# Patient Record
Sex: Female | Born: 1940
Health system: Southern US, Community
[De-identification: ages and names within clinical notes are randomized; demographics above are authoritative.]

## PROBLEM LIST (undated history)

## (undated) DIAGNOSIS — Z923 Personal history of irradiation: Secondary | ICD-10-CM

## (undated) DIAGNOSIS — I10 Essential (primary) hypertension: Secondary | ICD-10-CM

## (undated) HISTORY — PX: NO PAST SURGERIES: SHX2092

---

## 2019-01-07 ENCOUNTER — Ambulatory Visit
Admission: EM | Admit: 2019-01-07 | Discharge: 2019-01-07 | Disposition: A | Discharge status: Home / Self Care | Payer: Medicare Other

## 2019-01-07 ENCOUNTER — Encounter: Payer: Self-pay | Admitting: Emergency Medicine

## 2019-01-07 ENCOUNTER — Other Ambulatory Visit: Payer: Self-pay

## 2019-01-07 DIAGNOSIS — R222 Localized swelling, mass and lump, trunk: Secondary | ICD-10-CM | POA: Diagnosis not present

## 2019-01-07 NOTE — ED Notes (Signed)
Patient able to ambulate independently  

## 2019-01-07 NOTE — ED Triage Notes (Signed)
Pt presents to [facility] for assessment to lump on right breast bone which she noted a few weeks ago.  States it is not painful to touch.

## 2019-01-07 NOTE — ED Provider Notes (Signed)
[name]    CSN: [id number] Arrival date & time: 01/07/19  1406      History   Chief Complaint Chief Complaint  Patient presents with  . Lump on Chest    HPI Barbara Green is a 78 y.o. female.   78 year old female with no documented medical history, without PCP care/follow up for 10+ years comes in for right breast/chest mass. She is unsure how long mass has been present, noticed few weeks ago after showering. Denies pain, itching. Denies changes in size. Denies chest pain, shortness of breath, palpitations. Denies nipple discharge, skin changes. Denies personal or family history of breast cancer. Denies ever getting mammograms.      History reviewed. No pertinent past medical history.  There are no problems to display for this patient.   History reviewed. No pertinent surgical history.  OB History   No obstetric history on file.      Home Medications    Prior to Admission medications   Not on File    Family History Family History  Problem Relation Age of Onset  . Hypertension Mother     Social History Social History   Tobacco Use  . Smoking status: Current Every Day Smoker    Packs/day: 0.50  . Smokeless tobacco: Never Used  Substance Use Topics  . Alcohol use: Yes    Comment: occasionally  . Drug use: Never     Allergies   Patient has no known allergies.   Review of Systems Review of Systems  Reason unable to perform ROS: See HPI as above.     Physical Exam Triage Vital Signs ED Triage Vitals  Enc Vitals Group     BP 01/07/19 1414 (!) 167/87     Pulse Rate 01/07/19 1414 (!) 126     Resp 01/07/19 1414 20     Temp 01/07/19 1414 98.4 F (36.9 C)     Temp Source 01/07/19 1414 Oral     SpO2 01/07/19 1414 94 %     Weight --      Height --      Head Circumference --      Peak Flow --      Pain Score 01/07/19 1421 0     Pain Loc --      Pain Edu? --      Excl. in [place]? --    No data found.  Updated Vital  Signs BP (!) 167/87 (BP Location: Left Arm)   Pulse (!) 110   Temp 98.4 F (36.9 C) (Oral)   Resp 20   SpO2 94%   Physical Exam Constitutional:      General: She is not in acute distress.    Appearance: Normal appearance. She is not ill-appearing, toxic-appearing or diaphoretic.  HENT:     Head: Normocephalic and atraumatic.     Mouth/Throat:     Mouth: Mucous membranes are moist.     Pharynx: Oropharynx is clear. Uvula midline.  Cardiovascular:     Rate and Rhythm: Regular rhythm. Tachycardia present.     Heart sounds: Normal heart sounds. No murmur. No friction rub. No gallop.   Pulmonary:     Effort: Pulmonary effort is normal. No accessory muscle usage, prolonged expiration, respiratory distress or retractions.     Comments: Lungs clear to auscultation without adventitious lung sounds. Chest:       Comments: No obvious tenderness or mass palpated to the rest of the breast. No nipple changes. No  obvious lymphadenopathy felt.  Musculoskeletal:     Cervical back: Normal range of motion and neck supple.  Neurological:     General: No focal deficit present.     Mental Status: She is alert and oriented to person, place, and time.      UC Treatments / Results  Labs (all labs ordered are listed, but only abnormal results are displayed) Labs Reviewed - No data to display  EKG   Radiology No results found.  Procedures Procedures (including critical care time)  Medications Ordered in UC Medications - No data to display  Initial Impression / Assessment and Plan / UC Course  I have reviewed the triage vital signs and the nursing notes.  Pertinent labs & imaging results that were available during my care of the patient were reviewed by me and considered in my medical decision making (see chart for details).    Patient with tachycardia during triage. Denies chest pain, shortness of breath, palpitations, weakness, dizziness, syncope. Tachycardia improving during  recheck, patient states feels nervous, will have her continue to monitoring.  Mass is protruding and visible. Appears to be more on the chest wall vs right breast. Discussed ordering mammogram and possible ultrasound to the breast, and if unable to view mass, to follow up with PCP. Patient would like to first follow up with PCP for evaluation. PCP resources provided. Return precautions given. Patient expresses understanding and agrees to plan.  Final Clinical Impressions(s) / UC Diagnoses   Final diagnoses:  Mass of right chest wall   ED Prescriptions    None     PDMP not reviewed this encounter.   Ok Edwards, PA-C 01/07/19 1523

## 2019-01-07 NOTE — Discharge Instructions (Signed)
As discussed, I would like this to be further evaluated. Please follow up with PCP. At this time, continue to monitor size.

## 2019-02-07 ENCOUNTER — Telehealth: Payer: Self-pay

## 2019-02-07 NOTE — Telephone Encounter (Signed)
Called patient to do their pre-visit COVID screening.  Call went to voicemail. Unable to do prescreening.  

## 2019-02-08 ENCOUNTER — Other Ambulatory Visit: Payer: Self-pay

## 2019-02-08 ENCOUNTER — Ambulatory Visit (INDEPENDENT_AMBULATORY_CARE_PROVIDER_SITE_OTHER): Payer: Medicare Other | Admitting: Internal Medicine

## 2019-02-08 VITALS — BP 207/130 | HR 123 | Temp 97.2°F | Resp 18 | Ht 59.0 in | Wt 116.8 lb

## 2019-02-08 DIAGNOSIS — Z2821 Immunization not carried out because of patient refusal: Secondary | ICD-10-CM

## 2019-02-08 DIAGNOSIS — F17210 Nicotine dependence, cigarettes, uncomplicated: Secondary | ICD-10-CM

## 2019-02-08 DIAGNOSIS — I10 Essential (primary) hypertension: Secondary | ICD-10-CM | POA: Diagnosis not present

## 2019-02-08 DIAGNOSIS — N6312 Unspecified lump in the right breast, upper inner quadrant: Secondary | ICD-10-CM | POA: Insufficient documentation

## 2019-02-08 DIAGNOSIS — R000 Tachycardia, unspecified: Secondary | ICD-10-CM | POA: Diagnosis not present

## 2019-02-08 DIAGNOSIS — F17200 Nicotine dependence, unspecified, uncomplicated: Secondary | ICD-10-CM | POA: Insufficient documentation

## 2019-02-08 MED ORDER — AMLODIPINE BESYLATE 5 MG PO TABS: 7.5000 mg | ORAL_TABLET | Freq: Every day | ORAL | 5 refills | Status: DC

## 2019-02-08 NOTE — Patient Instructions (Addendum)
You have been referred for a mammogram and to see a general surgeon.  You will be called with these appointments.    Try to limit salt in the foods.    We have started you on a blood pressure medication called Norvasc (Amlodipine) to help lower your blood pressure.

## 2019-02-08 NOTE — Progress Notes (Signed)
Patient ID: Barbara Green, female    DOB: [birth year]  MRN: [id number]  CC: Establish Care and Follow-up (urgent care f/up for lump on chest. states that it isn't painful. hasn't increased in size.)   Subjective: Barbara Green is a 79 y.o. female who presents for new pt visit at [facility] Her concerns today include:   pt with hx of tob dep No previous PCP.  Pt states it has been a long time since she saw a primary doctor.    Seen at UC 1 mth ago with concern about a knot located in center of chest close to the RT breast that she just notice. Not painful, no redness.  No increase in size since 1 mth ago. Never kept up to date with MMG.  In fact she never had a MMG BP noted to be elevated.  No BP issues in past Endorses some lightheadedness in mornings with sudden position changes.  Last several seconds. Dizziness when in bed.   No headaches, CP, SOB, LE edema.  Use to be active prior to COVID.  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   She does not use a lot of salt in foods.  She cooks with fresh ingredients rather than a lot of can foods.   Pulse also noted to be elev in UC and today.  Denies palpitations.   No unexplained wgh changes.  No chronic diarrhea.  No hot or cold intolerance.   Tob dep:  Smoked for 30+ yrs.  At most she was 1 pk some days.  Currently 1/3-1/2 pk a day.  Quit in the past for 1 mth.  Thinks quitting would be nice but feels too stress right now in caring for spouse and COVID pandemic to quit  Past medical, surgical, family history and social history reviewed and updated.  Patient Active Problem List   Diagnosis Date Noted  . Tobacco dependence 02/08/2019  . Unspecified lump in the right breast, upper inner quadrant  02/08/2019     No current outpatient medications on file prior to visit.   No current facility-administered medications on file prior to visit.    No Known Allergies  Social History    Socioeconomic History  . Marital status: Married    Spouse name: Not on file  . Number of children: 3  . Years of education: Not on file  . Highest education level: 12th grade  Occupational History  . Not on file  Tobacco Use  . Smoking status: Current Every Day Smoker    Packs/day: 0.33    Years: 30.00    Pack years: 9.90  . Smokeless tobacco: Never Used  Substance and Sexual Activity  . Alcohol use: Yes    Comment: occasionally a glass wine  . Drug use: Never  . Sexual activity: Not on file  Other Topics Concern  . Not on file  Social History Narrative  . Not on file   Social Determinants of Health   Financial Resource Strain:   . Difficulty of Paying Living Expenses: Not on file  Food Insecurity:   . Worried About Charity fundraiser in the Last Year: Not on file  . Ran Out of Food in the Last Year: Not on file  Transportation Needs:   . Lack of Transportation (Medical): Not on file  . Lack of Transportation (Non-Medical): Not on file  Physical Activity:   . Days of Exercise per Week:  Not on file  . Minutes of Exercise per Session: Not on file  Stress:   . Feeling of Stress : Not on file  Social Connections:   . Frequency of Communication with Friends and Family: Not on file  . Frequency of Social Gatherings with Friends and Family: Not on file  . Attends Religious Services: Not on file  . Active Member of Clubs or Organizations: Not on file  . Attends Archivist Meetings: Not on file  . Marital Status: Not on file  Intimate Partner Violence:   . Fear of Current or Ex-Partner: Not on file  . Emotionally Abused: Not on file  . Physically Abused: Not on file  . Sexually Abused: Not on file    Family History  Problem Relation Age of Onset  . Alzheimer's disease Mother   . Heart disease Father   . Alcohol abuse Father     Past Surgical History:  Procedure Laterality Date  . NO PAST SURGERIES      ROS: Review of Systems  Constitutional:  Negative for appetite change, fatigue and fever.  HENT: Negative for sore throat.   Eyes: Negative for visual disturbance.  Respiratory: Negative for cough and shortness of breath.   Cardiovascular: Negative for chest pain and palpitations.  Gastrointestinal: Negative for abdominal pain and blood in stool.  Genitourinary: Negative for difficulty urinating, dysuria and hematuria.  Skin: Negative for rash.  Psychiatric/Behavioral: Negative for dysphoric mood. The patient is not nervous/anxious.    Negative except as stated above  PHYSICAL EXAM: BP (!) 207/130   Pulse (!) 123   Temp (!) 97.2 F (36.2 C) (Temporal)   Resp 18   Ht 4\' 11"  (1.499 m)   Wt 116 lb 12.8 oz (53 kg)   SpO2 96%   BMI 23.59 kg/m   Physical Exam Blood pressure left arm 173/106; right arm 175/105 General appearance - alert, well appearing, elderly caucasian female and in no distress Mental status - normal mood, behavior, speech, dress, motor activity, and thought processes Eyes - pink conjunctiva, EOMI Nose - normal and patent, no erythema, discharge or polyps Mouth - mucous membranes moist, pharynx normal without lesions.  She has dentures above and below. Neck -no palpable masses.  Neck is supple.  No thyroid enlargement or thyroid nodules Lymphatics -no cervical or axillary lymphadenopathy no hepatosplenomegaly Chest - clear to auscultation, no wheezes, rales or rhonchi, symmetric air entry Heart -tachycardic with regular and rhythm.  No gallops or murmurs appreciated  abdomen - soft, nontender, nondistended, no masses or organomegaly Breasts -CMA Cara Walker present: No significant asymmetry noted of the breasts.  There is no 1 cm superficial, firm mass in the upper inner quadrant of the right breast.  It is in the forearm border of the breast tissue the sternum.  No other masses palpated.  No discharge expressed from the nipples Extremities -no lower extremity edema.  EKG sinus tachycardia at a rate of 111.   Poor R wave progression.  Low voltage ASSESSMENT AND PLAN: 1. Unspecified lump in the right breast, upper inner quadrant  This may be a cyst but in any event it needs to be evaluated and diagnosed given that it is located at the edge of the breast tissue. - MM Digital Diagnostic Unilat R; Future - Ambulatory referral to General Surgery  2. Essential hypertension Patient reports no known previous history.  She has significant elevation of both systolic and diastolic blood pressure noted 1 month ago on visit  to urgent care and also today.  I recommend starting blood pressure medication.  Ideally I would have wanted to start her on lisinopril/HCTZ combination but I do not know what her kidney function is like.  So for now I will put her on amlodipine 7.5 mg daily and check baseline blood tests.  Discussed DASH diet.  She does have access to a blood pressure monitoring device at home.  Have asked her to check her blood pressure at least twice a week and bring in readings on next visit.  Went over goal with her above 130/80 or lower - CBC - Comprehensive metabolic panel - Lipid panel - amLODipine (NORVASC) 5 MG tablet; Take 1.5 tablets (7.5 mg total) by mouth daily.  Dispense: 45 tablet; Refill: 5  3. Tachycardia If we need to add on BP med on future visit, consider Coreg - EKG 12-Lead - TSH  4. Tobacco dependence Patient advised to quit smoking. Discussed health risks associated with smoking including lung and other types of cancers, chronic lung diseases and CV risks.. Pt not ready to give trail of quitting.  Less than 5 minutes spent on counseling.  5. Influenza vaccination declined This was offered.  Pt declined  6. Pneumococcal vaccination declined This was offered  Pt declined    Patient was given the opportunity to ask questions.  Patient verbalized understanding of the plan and was able to repeat key elements of the plan.   Orders Placed This Encounter  Procedures  . MM  Digital Diagnostic Unilat R  . CBC  . Comprehensive metabolic panel  . Lipid panel  . TSH  . Ambulatory referral to General Surgery  . EKG 12-Lead     Requested Prescriptions   Signed Prescriptions Disp Refills  . amLODipine (NORVASC) 5 MG tablet 45 tablet 5    Sig: Take 1.5 tablets (7.5 mg total) by mouth daily.    Return in about 1 week (around 02/15/2019) for BP recheck.  Karle Plumber, MD, FACP

## 2019-02-09 ENCOUNTER — Telehealth: Payer: Self-pay | Admitting: Internal Medicine

## 2019-02-09 DIAGNOSIS — E875 Hyperkalemia: Secondary | ICD-10-CM

## 2019-02-09 LAB — COMPREHENSIVE METABOLIC PANEL
ALT: 23 IU/L (ref 0–32)
AST: 23 IU/L (ref 0–40)
Albumin/Globulin Ratio: 2.1 (ref 1.2–2.2)
Albumin: 4.7 g/dL (ref 3.7–4.7)
Alkaline Phosphatase: 86 IU/L (ref 39–117)
BUN/Creatinine Ratio: 12 (ref 12–28)
BUN: 6 mg/dL — ABNORMAL LOW (ref 8–27)
Bilirubin Total: 0.5 mg/dL (ref 0.0–1.2)
CO2: 25 mmol/L (ref 20–29)
Calcium: 9.6 mg/dL (ref 8.7–10.3)
Chloride: 95 mmol/L — ABNORMAL LOW (ref 96–106)
Creatinine, Ser: 0.51 mg/dL — ABNORMAL LOW (ref 0.57–1.00)
GFR calc Af Amer: 107 mL/min/{1.73_m2} (ref >59)
GFR calc non Af Amer: 93 mL/min/{1.73_m2} (ref >59)
Globulin, Total: 2.2 g/dL (ref 1.5–4.5)
Glucose: 105 mg/dL — ABNORMAL HIGH (ref 65–99)
Potassium: 5.5 mmol/L — ABNORMAL HIGH (ref 3.5–5.2)
Sodium: 133 mmol/L — ABNORMAL LOW (ref 134–144)
Total Protein: 6.9 g/dL (ref 6.0–8.5)

## 2019-02-09 LAB — CBC
Hematocrit: 43.8 % (ref 34.0–46.6)
Hemoglobin: 14.8 g/dL (ref 11.1–15.9)
MCH: 30.9 pg (ref 26.6–33.0)
MCHC: 33.8 g/dL (ref 31.5–35.7)
MCV: 91 fL (ref 79–97)
Platelets: 335 10*3/uL (ref 150–450)
RBC: 4.79 x10E6/uL (ref 3.77–5.28)
RDW: 12.9 % (ref 11.7–15.4)
WBC: 10.4 10*3/uL (ref 3.4–10.8)

## 2019-02-09 LAB — LIPID PANEL
Chol/HDL Ratio: 2.3 ratio (ref 0.0–4.4)
Cholesterol, Total: 274 mg/dL — ABNORMAL HIGH (ref 100–199)
HDL: 117 mg/dL (ref >39)
LDL Chol Calc (NIH): 148 mg/dL — ABNORMAL HIGH (ref 0–99)
Triglycerides: 58 mg/dL (ref 0–149)
VLDL Cholesterol Cal: 9 mg/dL (ref 5–40)

## 2019-02-09 LAB — TSH: TSH: 1.74 u[IU]/mL (ref 0.450–4.500)

## 2019-02-09 NOTE — Telephone Encounter (Signed)
PC placed to pt today to go over lab results.  Pt informed that potassium level is elevated. This may be a true elevation or due to hemolysis.  I recommend that she return to lab for repeat.  CMA will call her with appt for lab for this week. Recommend decreasing consumption of potassium rich foods like bananas, OJ, oranges etc.   Thyroid level nl. Total and LDL cholesterol elevated.  Went over goals.  Dietary counseling given including basics on how to read food labels when it comes to fat content.  Blood count nl.  Kidney and LFTs nl. Pt expressed understanding and thanked me for calling her.

## 2019-02-09 NOTE — Telephone Encounter (Signed)
Please schedule lab appointment. Does not need to be fasting.

## 2019-02-23 ENCOUNTER — Other Ambulatory Visit: Payer: Self-pay

## 2019-02-23 ENCOUNTER — Other Ambulatory Visit (INDEPENDENT_AMBULATORY_CARE_PROVIDER_SITE_OTHER): Payer: Medicare Other

## 2019-02-23 DIAGNOSIS — I10 Essential (primary) hypertension: Secondary | ICD-10-CM | POA: Diagnosis not present

## 2019-02-23 DIAGNOSIS — E875 Hyperkalemia: Secondary | ICD-10-CM

## 2019-02-23 NOTE — Progress Notes (Signed)
Patient here for BP check & fasting labs. Has been taking medication regularly. After sitting BP was 142/90, pulse was 113. Stopped patient to the desk to make a BP follow up with the provider. KWalker, CMA.

## 2019-02-24 LAB — POTASSIUM: Potassium: 4.8 mmol/L (ref 3.5–5.2)

## 2019-02-24 NOTE — Progress Notes (Signed)
Patient notified of results & recommendations. Expressed understanding.

## 2019-02-28 ENCOUNTER — Other Ambulatory Visit: Payer: Self-pay | Admitting: Internal Medicine

## 2019-02-28 DIAGNOSIS — N6312 Unspecified lump in the right breast, upper inner quadrant: Secondary | ICD-10-CM

## 2019-03-15 ENCOUNTER — Telehealth: Payer: Self-pay

## 2019-03-15 ENCOUNTER — Ambulatory Visit: Payer: Medicare Other | Admitting: Internal Medicine

## 2019-03-15 NOTE — Telephone Encounter (Signed)

## 2019-03-16 ENCOUNTER — Ambulatory Visit (INDEPENDENT_AMBULATORY_CARE_PROVIDER_SITE_OTHER): Payer: Medicare Other | Admitting: Internal Medicine

## 2019-03-16 ENCOUNTER — Other Ambulatory Visit: Payer: Self-pay

## 2019-03-16 ENCOUNTER — Encounter: Payer: Self-pay | Admitting: Internal Medicine

## 2019-03-16 VITALS — BP 159/85 | HR 108 | Temp 98.4°F | Resp 17 | Wt 114.0 lb

## 2019-03-16 DIAGNOSIS — I10 Essential (primary) hypertension: Secondary | ICD-10-CM

## 2019-03-16 MED ORDER — LOSARTAN POTASSIUM 25 MG PO TABS: 25.0000 mg | ORAL_TABLET | Freq: Every day | ORAL | 0 refills | Status: DC

## 2019-03-16 MED ORDER — AMLODIPINE BESYLATE 10 MG PO TABS: 10.0000 mg | ORAL_TABLET | Freq: Every day | ORAL | 3 refills | Status: DC

## 2019-03-16 NOTE — Progress Notes (Signed)
  Subjective:    Barbara Green - 79 y.o. female MRN [id number]  Date of birth: 06-25-1940  HPI  Barbara Green is here for HTN follow up. Newly diagnosed at last office visit on 1/12 and started on Amlodipine 7.5 mg daily.   Chronic HTN Disease Monitoring:  Home BP Monitoring - Husband has a home health aid and she has been checking it about 3x a week for her. Last check was in 140s/80s. But was fluctuating a lot.  Chest pain- no  Dyspnea- no Headache - no  Medications: Amlodipine 7.5 mg daily  Compliance- yes Lightheadedness- no  Edema- no   Health Maintenance Due  Topic Date Due  . DEXA SCAN  09/05/2006    -  reports that she has been smoking. She has a 9.90 pack-year smoking history. She has never used smokeless tobacco. - Review of Systems: Per HPI. - Past Medical History: Patient Active Problem List   Diagnosis Date Noted  . Tobacco dependence 02/08/2019  . Unspecified lump in the right breast, upper inner quadrant  02/08/2019  . Essential hypertension 02/08/2019  . Tachycardia 02/08/2019   - Medications: reviewed and updated   Objective:   Physical Exam BP (!) 159/85   Pulse (!) 108   Temp 98.4 F (36.9 C) (Temporal)   Resp 17   Wt 114 lb (51.7 kg)   SpO2 96%   BMI 23.03 kg/m  Physical Exam  Constitutional: She is oriented to person, place, and time and well-developed, well-nourished, and in no distress. No distress.  HENT:  Head: Normocephalic and atraumatic.  Eyes: Conjunctivae and EOM are normal.  Cardiovascular: Regular rhythm and normal heart sounds.  No murmur heard. Mildly tachycardic.   Pulmonary/Chest: Effort normal and breath sounds normal. No respiratory distress.  Musculoskeletal:        General: Normal range of motion.  Neurological: She is alert and oriented to person, place, and time.  Skin: Skin is warm and dry. She is not diaphoretic.  Psychiatric: Affect and judgment normal.           Assessment & Plan:    1. Essential hypertension BP not at goal today. Increase Amlodipine. Start Losartan. Hyperkalemia present has resolved on repeat blood work. Will repeat BMET in one week with CMA BP check as well. Continue to monitor at home.  Counseled on blood pressure goal of less than 130/80, low-sodium, DASH diet, medication compliance, 150 minutes of moderate intensity exercise per week. Discussed medication compliance, adverse effects. - amLODipine (NORVASC) 10 MG tablet; Take 1 tablet (10 mg total) by mouth daily.  Dispense: 90 tablet; Refill: 3 - losartan (COZAAR) 25 MG tablet; Take 1 tablet (25 mg total) by mouth daily.  Dispense: 30 tablet; Refill: 0 - Basic metabolic panel; Future      Phill Myron, D.O. 03/16/2019, 1:43 PM Primary Care at [facility]

## 2019-03-16 NOTE — Patient Instructions (Signed)
Thank you for choosing Primary Care at [facility] to be your medical home!    [name] was seen by Melina Schools, DO today.   For the best care possible, you should try to see Phill Myron, DO whenever you come to the clinic.   We look forward to seeing you again soon!  If you have any questions about your visit today, please call us at [phone] or feel free to reach your primary care provider via [place].

## 2019-03-23 ENCOUNTER — Other Ambulatory Visit: Payer: Self-pay

## 2019-03-23 ENCOUNTER — Ambulatory Visit: Payer: Medicare Other

## 2019-03-23 DIAGNOSIS — I10 Essential (primary) hypertension: Secondary | ICD-10-CM

## 2019-03-23 NOTE — Progress Notes (Signed)
Patient here for BP check & repeat BMP. States that her husband's caregiver checked her BP this morning & it was 117/79, pulse was 85.  After sitting BP was 128/72, pulse was 88. KWalker, CMA.

## 2019-03-24 LAB — BASIC METABOLIC PANEL
BUN/Creatinine Ratio: 17 (ref 12–28)
BUN: 9 mg/dL (ref 8–27)
CO2: 22 mmol/L (ref 20–29)
Calcium: 10.2 mg/dL (ref 8.7–10.3)
Chloride: 100 mmol/L (ref 96–106)
Creatinine, Ser: 0.53 mg/dL — ABNORMAL LOW (ref 0.57–1.00)
GFR calc Af Amer: 106 mL/min/{1.73_m2} (ref >59)
GFR calc non Af Amer: 92 mL/min/{1.73_m2} (ref >59)
Glucose: 108 mg/dL — ABNORMAL HIGH (ref 65–99)
Potassium: 5.2 mmol/L (ref 3.5–5.2)
Sodium: 139 mmol/L (ref 134–144)

## 2019-04-07 ENCOUNTER — Ambulatory Visit
Admission: RE | Admit: 2019-04-07 | Discharge: 2019-04-07 | Disposition: A | Discharge status: Home / Self Care | Payer: Medicare Other | Source: Ambulatory Visit | Attending: Internal Medicine | Admitting: Internal Medicine

## 2019-04-07 ENCOUNTER — Other Ambulatory Visit: Payer: Self-pay

## 2019-04-07 ENCOUNTER — Other Ambulatory Visit: Payer: Self-pay | Admitting: Internal Medicine

## 2019-04-07 DIAGNOSIS — N6310 Unspecified lump in the right breast, unspecified quadrant: Secondary | ICD-10-CM

## 2019-04-07 DIAGNOSIS — N6312 Unspecified lump in the right breast, upper inner quadrant: Secondary | ICD-10-CM

## 2019-04-13 ENCOUNTER — Other Ambulatory Visit: Payer: Self-pay

## 2019-04-13 ENCOUNTER — Ambulatory Visit
Admission: RE | Admit: 2019-04-13 | Discharge: 2019-04-13 | Disposition: A | Discharge status: Home / Self Care | Payer: Medicare Other | Source: Ambulatory Visit | Attending: Internal Medicine | Admitting: Internal Medicine

## 2019-04-13 DIAGNOSIS — N6310 Unspecified lump in the right breast, unspecified quadrant: Secondary | ICD-10-CM

## 2019-04-20 ENCOUNTER — Encounter: Payer: Self-pay | Admitting: Adult Health

## 2019-04-20 DIAGNOSIS — C50211 Malignant neoplasm of upper-inner quadrant of right female breast: Secondary | ICD-10-CM | POA: Insufficient documentation

## 2019-04-22 ENCOUNTER — Other Ambulatory Visit: Payer: Self-pay | Admitting: Surgery

## 2019-04-22 DIAGNOSIS — Z853 Personal history of malignant neoplasm of breast: Secondary | ICD-10-CM

## 2019-04-25 ENCOUNTER — Telehealth: Payer: Self-pay | Admitting: Internal Medicine

## 2019-04-25 DIAGNOSIS — I10 Essential (primary) hypertension: Secondary | ICD-10-CM

## 2019-04-25 MED ORDER — LOSARTAN POTASSIUM 25 MG PO TABS: 25.0000 mg | ORAL_TABLET | Freq: Every day | ORAL | 1 refills | Status: DC

## 2019-04-25 NOTE — Telephone Encounter (Signed)
Refill sent.

## 2019-04-25 NOTE — Telephone Encounter (Signed)
1) Medication(s) Requested (by name):  losartan (COZAAR) 25 MG tablet [id number]   2) Pharmacy of Choice: [facility], [address]  [place], [place] [address]     Approved medications will be sent to pharmacy, we will reach out to you if there is an issue.  Requests made after 3pm may not be addressed until following business day!

## 2019-04-27 ENCOUNTER — Other Ambulatory Visit: Payer: Self-pay

## 2019-04-27 ENCOUNTER — Ambulatory Visit
Admission: RE | Admit: 2019-04-27 | Discharge: 2019-04-27 | Disposition: A | Discharge status: Home / Self Care | Payer: Medicare Other | Source: Ambulatory Visit | Attending: Radiation Oncology | Admitting: Radiation Oncology

## 2019-04-27 ENCOUNTER — Encounter: Payer: Self-pay | Admitting: Radiation Oncology

## 2019-04-27 ENCOUNTER — Encounter: Payer: Self-pay | Admitting: *Deleted

## 2019-04-27 VITALS — BP 149/79 | HR 115 | Temp 97.8°F | Resp 20 | Ht 59.0 in | Wt 116.2 lb

## 2019-04-27 DIAGNOSIS — Z170 Estrogen receptor positive status [ER+]: Secondary | ICD-10-CM | POA: Diagnosis not present

## 2019-04-27 DIAGNOSIS — Z79899 Other long term (current) drug therapy: Secondary | ICD-10-CM | POA: Insufficient documentation

## 2019-04-27 DIAGNOSIS — C50211 Malignant neoplasm of upper-inner quadrant of right female breast: Secondary | ICD-10-CM

## 2019-04-27 DIAGNOSIS — F17210 Nicotine dependence, cigarettes, uncomplicated: Secondary | ICD-10-CM | POA: Insufficient documentation

## 2019-04-27 NOTE — Patient Instructions (Signed)
Coronavirus (COVID-19) Are you at risk?  Are you at risk for the Coronavirus (COVID-19)?  To be considered HIGH RISK for Coronavirus (COVID-19), you have to meet the following criteria:  . Traveled to [place], [place], [place], [place] or [place]; or in the [place] to [place], [place], [place], or [place]; and have fever, cough, and shortness of breath within the last 2 weeks of travel OR . Been in close contact with a person diagnosed with COVID-19 within the last 2 weeks and have fever, cough, and shortness of breath . IF YOU DO NOT MEET THESE CRITERIA, YOU ARE CONSIDERED LOW RISK FOR COVID-19.  What to do if you are HIGH RISK for COVID-19?  . If you are having a medical emergency, call 911. . Seek medical care right away. Before you go to a doctor's office, urgent care or emergency department, call ahead and tell them about your recent travel, contact with someone diagnosed with COVID-19, and your symptoms. You should receive instructions from your physician's office regarding next steps of care.  . When you arrive at healthcare provider, tell the healthcare staff immediately you have returned from visiting [place], [place], [place], [place] or [place]; or traveled in the [place] to [place], [place], [place], or [place]; in the last two weeks or you have been in close contact with a person diagnosed with COVID-19 in the last 2 weeks.   . Tell the health care staff about your symptoms: fever, cough and shortness of breath. . After you have been seen by a medical provider, you will be either: o Tested for (COVID-19) and discharged home on quarantine except to seek medical care if symptoms worsen, and asked to  - Stay home and avoid contact with others until you get your results (4-5 days)  - Avoid travel on public transportation if possible (such as bus, train, or airplane) or o Sent to the Emergency Department by EMS for evaluation, COVID-19 testing, and possible  admission depending on your condition and test results.  What to do if you are LOW RISK for COVID-19?  Reduce your risk of any infection by using the same precautions used for avoiding the common cold or flu:  . Wash your hands often with soap and warm water for at least 20 seconds.  If soap and water are not readily available, use an alcohol-based hand sanitizer with at least 60% alcohol.  . If coughing or sneezing, cover your mouth and nose by coughing or sneezing into the elbow areas of your shirt or coat, into a tissue or into your sleeve (not your hands). . Avoid shaking hands with others and consider head nods or verbal greetings only. . Avoid touching your eyes, nose, or mouth with unwashed hands.  . Avoid close contact with people who are sick. . Avoid places or events with large numbers of people in one location, like concerts or sporting events. . Carefully consider travel plans you have or are making. . If you are planning any travel outside or inside the [place], visit the CDC's Travelers' Health webpage for the latest health notices. . If you have some symptoms but not all symptoms, continue to monitor at home and seek medical attention if your symptoms worsen. . If you are having a medical emergency, call 911.   ADDITIONAL HEALTHCARE OPTIONS FOR PATIENTS  [facility] / e-Visit: [email or web address]         [facility]: [phone]  [facility]: [phone]                   [facility]: [phone]   

## 2019-04-27 NOTE — Progress Notes (Signed)
Location of Breast Cancer: RIGHT breast  Histology per Pathology Report: Diagnosis Breast, right, needle core biopsy, 3 o'clock - INVASIVE DUCTAL CARCINOMA, SEE COMMENT. Microscopic Comment The carcinoma appears grade 2 and measures 12 mm in greatest linear extent. There is perineural invasion present.  Receptor Status: ER(100%), PR (10%), Her2-neu (negative), Ki-(5%)  Did patient present with symptoms (if so, please note symptoms) or was this found on screening mammography?: Per Urgent Care Note 01/07/19:  79 year old female with no documented medical history, without PCP care/follow up for 10+ years comes in for right breast/chest mass. She is unsure how long mass has been present, noticed few weeks ago after showering. Denies pain, itching. Denies changes in size. Denies chest pain, shortness of breath, palpitations. Denies nipple discharge, skin changes. Denies personal or family history of breast cancer. Denies ever getting mammograms.    Past/Anticipated interventions by surgeon, if any: 05/17/19: RIGHT BREAST LUMPECTOMY WITH SENTINEL LYMPH NODE BX Coralie Keens, MD  Past/Anticipated interventions by medical oncology, if any: Chemotherapy None at this time.  Lymphedema issues, if any:  Pt has not had surgery at this time.   Pain issues, if any:  Pt denies c/o pain.  SAFETY ISSUES:  Prior radiation? no  Pacemaker/ICD? no  Possible current pregnancy?no  Is the patient on methotrexate? no  Current Complaints / other details:  Pt presents today for initial consult with Dr. Sondra Come for Radiation Oncology. Pt is unaccompanied.  BP (!) 149/79 (BP Location: Left Arm, Patient Position: Sitting, Cuff Size: Normal)   Pulse (!) 115   Temp 97.8 F (36.6 C)   Resp 20   Ht '4\' 11"'  (1.499 m)   Wt 116 lb 3.2 oz (52.7 kg)   SpO2 97%   BMI 23.47 kg/m   Wt Readings from Last 3 Encounters:  04/27/19 116 lb 3.2 oz (52.7 kg)  03/16/19 114 lb (51.7 kg)  02/08/19 116 lb 12.8 oz (53 kg)        Loma Sousa, RN 04/27/2019,12:54 PM

## 2019-04-27 NOTE — Progress Notes (Signed)
Radiation Oncology         [phone] ________________________________  Initial Outpatient Consultation  Name: Barbara Green MRN: [id number]  Date: 04/27/2019  DOB: 03/18/1940  DC:VUDTHYH, Bayard Beaver, DO  Coralie Keens, MD   REFERRING PHYSICIAN: Coralie Keens, MD  DIAGNOSIS: The encounter diagnosis was Malignant neoplasm of upper-inner quadrant of right breast in female, estrogen receptor positive ([place]).  Stage Ia Right Breast UIQ, Invasive Ductal Carcinoma, ER+ / PR+ / Her2-, Grade 2  HISTORY OF PRESENT ILLNESS::Barbara Green is a 79 y.o. female who is accompanied by no one due to COVID-19 restrictions. She underwent bilateral diagnostic mammography and right breast ultrasonography on 04/07/2019 for 2-3 month history of palpable mass in the upper inner quadrant of the right breast. Results showed a suspicious mass in the 3 o'clock location of the right breast.  Biopsy on 04/13/2019 showed grade 2 invasive ductal carcinoma. Prognostic indicators significant for estrogen receptor, 100% positive and progesterone receptor, 10% positive, both with strong staining intensities. Proliferation marker Ki67 at 5%. HER2 negative.  The patient has not had any treatment thus far but is scheduled for right breast lumpectomy with sentinel node biopsy on 05/17/2019 with Dr. Ninfa Linden.  PREVIOUS RADIATION THERAPY: No  PAST MEDICAL HISTORY: History reviewed. No pertinent past medical history.  PAST SURGICAL HISTORY: Past Surgical History:  Procedure Laterality Date  . NO PAST SURGERIES      FAMILY HISTORY:  Family History  Problem Relation Age of Onset  . Alzheimer's disease Mother   . Heart disease Father   . Alcohol abuse Father     SOCIAL HISTORY:  Social History   Tobacco Use  . Smoking status: Current Every Day Smoker    Packs/day: 0.33    Years: 30.00    Pack years: 9.90  . Smokeless tobacco: Never Used  Substance Use Topics  . Alcohol use: Yes    Comment:  occasionally a glass wine  . Drug use: Never    ALLERGIES: No Known Allergies  MEDICATIONS:  Current Outpatient Medications  Medication Sig Dispense Refill  . amLODipine (NORVASC) 10 MG tablet Take 1 tablet (10 mg total) by mouth daily. 90 tablet 3  . losartan (COZAAR) 25 MG tablet Take 1 tablet (25 mg total) by mouth daily. 90 tablet 1   No current facility-administered medications for this encounter.    REVIEW OF SYSTEMS:  A 10+ POINT REVIEW OF SYSTEMS WAS OBTAINED including neurology, dermatology, psychiatry, cardiac, respiratory, lymph, extremities, GI, GU, musculoskeletal, constitutional, reproductive, HEENT.  She palpated the lump on self exam.  She denies any pain in this area nipple discharge or bleeding.  She denies any bleeding from the area   PHYSICAL EXAM:  height is '4\' 11"'  (1.499 m) and weight is 116 lb 3.2 oz (52.7 kg). Her temperature is 97.8 F (36.6 C). Her blood pressure is 149/79 (abnormal) and her pulse is 115 (abnormal). Her respiration is 20 and oxygen saturation is 97%.   General: Alert and oriented, in no acute distress HEENT: Head is normocephalic. Extraocular movements are intact.  Neck: Neck is supple, no palpable cervical or supraclavicular lymphadenopathy. Heart: Regular in rate and rhythm with no murmurs, rubs, or gallops. Chest: Clear to auscultation bilaterally, with no rhonchi, wheezes, or rales. Abdomen: Soft, nontender, nondistended, with no rigidity or guarding. Extremities: No cyanosis or edema. Lymphatics: see Neck Exam Skin: No concerning lesions. Musculoskeletal: symmetric strength and muscle tone throughout. Neurologic: Cranial nerves II through XII are grossly intact. No obvious focalities.  Speech is fluent. Coordination is intact. Psychiatric: Judgment and insight are intact. Affect is appropriate. Left breast: No palpable mass, nipple discharge, or bleeding. Right breast: 1.5 cm hard mass in the medial aspect of the breast located at  approximately the 2 o'clock position, parasternal area.  This appears to be fixed to surrounding breast and sternal area.  Some erythema but no skin breakthrough..  No nipple discharge or bleeding  ECOG = 1  0 - Asymptomatic (Fully active, able to carry on all predisease activities without restriction)  1 - Symptomatic but completely ambulatory (Restricted in physically strenuous activity but ambulatory and able to carry out work of a light or sedentary nature. For example, light housework, office work)  2 - Symptomatic, <50% in bed during the day (Ambulatory and capable of all self care but unable to carry out any work activities. Up and about more than 50% of waking hours)  3 - Symptomatic, >50% in bed, but not bedbound (Capable of only limited self-care, confined to bed or chair 50% or more of waking hours)  4 - Bedbound (Completely disabled. Cannot carry on any self-care. Totally confined to bed or chair)  5 - Death   Eustace Pen MM, Creech RH, Tormey DC, et al. [phone]). "Toxicity and response criteria of the [facility]". [place] Oncol. 5 (6): 649-55  LABORATORY DATA:  Lab Results  Component Value Date   WBC 10.4 02/08/2019   HGB 14.8 02/08/2019   HCT 43.8 02/08/2019   MCV 91 02/08/2019   PLT 335 02/08/2019   Lab Results  Component Value Date   NA 139 03/23/2019   K 5.2 03/23/2019   CL 100 03/23/2019   CO2 22 03/23/2019   GLUCOSE 108 (H) 03/23/2019   CREATININE 0.53 (L) 03/23/2019   CALCIUM 10.2 03/23/2019      RADIOGRAPHY: US BREAST LTD UNI RIGHT INC AXILLA  Result Date: 04/07/2019 CLINICAL DATA:  Palpable mass in the UPPER INNER QUADRANT of the RIGHT breast for 2 or 3 months. Patient has seen Dr. Ninfa Linden for consultation. EXAM: DIGITAL DIAGNOSTIC BILATERAL MAMMOGRAM WITH CAD AND TOMO ULTRASOUND RIGHT BREAST COMPARISON:  Baseline exam ACR Breast Density Category c: The breast tissue is heterogeneously dense, which may obscure small masses. FINDINGS:  No suspicious mass, distortion, or microcalcifications are identified to suggest presence of malignancy. The area of patient's concern in the MEDIAL portion of the RIGHT breast is too MEDIAL to be evaluated on routine or spot compression views. Mammographic images were processed with CAD. On physical exam, there is an erythematous nontender firm fixed 1-2 centimeter mass in the 3 o'clock parasternal region of the RIGHT breast. Targeted ultrasound is performed, showing an irregular hypoechoic mass with irregular margins and internal blood flow. Mass measures 1.6 x 1.4 x 1.5 centimeters. Mass appears to involve the pectoralis muscle, abutting the ribs and LATERAL margin of the sternum. No RIGHT axillary adenopathy. No internal mammary adenopathy seen sonographically. IMPRESSION: Suspicious mass in the 3 o'clock location of the RIGHT breast for which biopsy is recommended. RECOMMENDATION: Ultrasound-guided core biopsy of mass in the 3 o'clock location of the RIGHT breast. I have discussed the findings and recommendations with the patient. If applicable, a reminder letter will be sent to the patient regarding the next appointment. BI-RADS CATEGORY  4: Suspicious. Electronically Signed   By: Nolon Nations M.D.   On: 04/07/2019 14:00   MM DIAG BREAST TOMO BILATERAL  Result Date: 04/07/2019 CLINICAL DATA:  Palpable mass in the  UPPER INNER QUADRANT of the RIGHT breast for 2 or 3 months. Patient has seen Dr. Ninfa Linden for consultation. EXAM: DIGITAL DIAGNOSTIC BILATERAL MAMMOGRAM WITH CAD AND TOMO ULTRASOUND RIGHT BREAST COMPARISON:  Baseline exam ACR Breast Density Category c: The breast tissue is heterogeneously dense, which may obscure small masses. FINDINGS: No suspicious mass, distortion, or microcalcifications are identified to suggest presence of malignancy. The area of patient's concern in the MEDIAL portion of the RIGHT breast is too MEDIAL to be evaluated on routine or spot compression views. Mammographic  images were processed with CAD. On physical exam, there is an erythematous nontender firm fixed 1-2 centimeter mass in the 3 o'clock parasternal region of the RIGHT breast. Targeted ultrasound is performed, showing an irregular hypoechoic mass with irregular margins and internal blood flow. Mass measures 1.6 x 1.4 x 1.5 centimeters. Mass appears to involve the pectoralis muscle, abutting the ribs and LATERAL margin of the sternum. No RIGHT axillary adenopathy. No internal mammary adenopathy seen sonographically. IMPRESSION: Suspicious mass in the 3 o'clock location of the RIGHT breast for which biopsy is recommended. RECOMMENDATION: Ultrasound-guided core biopsy of mass in the 3 o'clock location of the RIGHT breast. I have discussed the findings and recommendations with the patient. If applicable, a reminder letter will be sent to the patient regarding the next appointment. BI-RADS CATEGORY  4: Suspicious. Electronically Signed   By: Nolon Nations M.D.   On: 04/07/2019 14:00   MM CLIP PLACEMENT RIGHT  Result Date: 04/13/2019 CLINICAL DATA:  Status post ultrasound-guided core biopsy of a right breast mass. EXAM: DIAGNOSTIC RIGHT MAMMOGRAM POST ULTRASOUND BIOPSY COMPARISON:  Previous exam(s). FINDINGS: Mammographic images were obtained following ultrasound guided biopsy of a mass in the far medial aspect of the right breast. The HydroMARK clip cannot be visualized secondary to the mass being in the far medial aspect of the right breast. IMPRESSION: Status post ultrasound-guided core biopsy of a mass in the 3 o'clock region of the right breast. The HydroMARK clip is not visualized secondary to the location of the mass in the far medial aspect of the 3 o'clock region of the right breast. Final Assessment: Post Procedure Mammograms for Marker Placement Electronically Signed   By: Lillia Mountain M.D.   On: 04/13/2019 12:03   [place] RT BREAST BX W LOC DEV 1ST LESION IMG BX SPEC US GUIDE  Addendum Date: 04/15/2019     ADDENDUM REPORT: 04/14/2019 12:32 ADDENDUM: Pathology revealed GRADE II INVASIVE DUCTAL CARCINOMA of the RIGHT breast, 3 o'clock. This was found to be concordant by Dr. Lillia Mountain. Pathology results were discussed with the patient by telephone. The patient reported doing well after the biopsy with tenderness at the site. Post biopsy instructions and care were reviewed and questions were answered. The patient was encouraged to call [facility] for any additional concerns. The patient is following up with Dr. Coralie Keens at [facility] on April 22, 2019. Pathology results reported by Stacie Acres RN on 04/14/2019. Electronically Signed   By: Lillia Mountain M.D.   On: 04/14/2019 12:32   Result Date: 04/15/2019 CLINICAL DATA:  Right breast mass. EXAM: ULTRASOUND GUIDED RIGHT BREAST CORE NEEDLE BIOPSY COMPARISON:  Previous exam(s). PROCEDURE: I met with the patient and we discussed the procedure of ultrasound-guided biopsy, including benefits and alternatives. We discussed the high likelihood of a successful procedure. We discussed the risks of the procedure, including infection, bleeding, tissue injury, clip migration, and inadequate sampling. Informed written consent was  given. The usual time-out protocol was performed immediately prior to the procedure. Lesion quadrant: 3 o'clock Using sterile technique and 1% lidocaine and 1% lidocaine with epinephrine as local anesthetic, under direct ultrasound visualization, a 14 gauge spring-loaded device was used to perform biopsy of a mass in the 3 o'clock region of the right breast using a medial to lateral approach. At the conclusion of the procedure [facility] tissue marker clip was deployed into the biopsy cavity. Follow up 2 view mammogram was performed and dictated separately. IMPRESSION: Ultrasound guided biopsy of the right breast. No apparent complications. Electronically Signed: By: Lillia Mountain M.D. On: 04/13/2019 11:51       IMPRESSION: Stage Ia,  Right Breast UIQ, Invasive Ductal Carcinoma, ER+ / PR+ / Her2-, Grade 2  Patient would appear to be a candidate for breast conservation with adjuvant radiation therapy.  She is scheduled for lumpectomy and sentinel node procedure in late April.  I would anticipate that surgical margins will be tight given the clinical findings on exam today,  we will likely recommend adjuvant radiation therapy as a component of her treatment.  Today, I talked to the patient  about the findings and work-up thus far.  We discussed the natural history of breast cancer and general treatment, highlighting the role of radiotherapy in the management.  We discussed the available radiation techniques, and focused on the details of logistics and delivery.  We reviewed the anticipated acute and late sequelae associated with radiation in this setting.  The patient was encouraged to ask questions that I answered to the best of my ability.    PLAN: The patient is scheduled for right breast lumpectomy with sentinel node biopsy on 05/17/2019 with Dr. Ninfa Linden.  Patient will be seen 2 to 3 weeks after her surgery for planning of her anticipated radiation treatments.  ------------------------------------------------  Blair Promise, PhD, MD  This document serves as a record of services personally performed by Gery Pray, MD. It was created on his behalf by Clerance Lav, a trained medical scribe. The creation of this record is based on the scribe's personal observations and the provider's statements to them. This document has been checked and approved by the attending provider.

## 2019-05-05 ENCOUNTER — Encounter: Payer: Self-pay | Admitting: *Deleted

## 2019-05-06 ENCOUNTER — Encounter: Payer: Self-pay | Admitting: *Deleted

## 2019-05-09 ENCOUNTER — Encounter: Payer: Self-pay | Admitting: *Deleted

## 2019-05-11 ENCOUNTER — Encounter: Payer: Self-pay | Admitting: *Deleted

## 2019-05-11 ENCOUNTER — Other Ambulatory Visit: Payer: Self-pay

## 2019-05-11 ENCOUNTER — Ambulatory Visit: Payer: Medicare Other | Admitting: Hematology and Oncology

## 2019-05-11 ENCOUNTER — Telehealth: Payer: Self-pay | Admitting: Hematology and Oncology

## 2019-05-11 ENCOUNTER — Encounter (HOSPITAL_BASED_OUTPATIENT_CLINIC_OR_DEPARTMENT_OTHER): Payer: Self-pay | Admitting: Surgery

## 2019-05-11 NOTE — Telephone Encounter (Signed)
Received a scheduling msg stating the pt wanted to cancel her appt w/Dr. Lindi Adie today.

## 2019-05-11 NOTE — Assessment & Plan Note (Deleted)
04/13/2019:Patient palpated an upper inner quadrant right breast mass for 2-3 months. Mammogram and US showed a 1.6cm mass at the 3 o'clock position, no right axillary adenopathy. Biopsy showed IDC, grade 2, HER-2 equivocal by IHC, negative by FISH, ER+ 100%, PR+ 10%, Ki67 5%.   Pathology and radiology counseling:Discussed with the patient, the details of pathology including the type of breast cancer,the clinical staging, the significance of ER, PR and HER-2/neu receptors and the implications for treatment. After reviewing the pathology in detail, we proceeded to discuss the different treatment options between surgery, radiation, chemotherapy, antiestrogen therapies.  Recommendations: 1. Breast conserving surgery followed by 2. Oncotype DX testing to determine if chemotherapy would be of any benefit followed by 3. Adjuvant radiation therapy followed by 4. Adjuvant antiestrogen therapy  Oncotype counseling: I discussed Oncotype DX test. I explained to the patient that this is a 21 gene panel to evaluate patient tumors DNA to calculate recurrence score. This would help determine whether patient has high risk or intermediate risk or low risk breast cancer. She understands that if her tumor was found to be high risk, she would benefit from systemic chemotherapy. If low risk, no need of chemotherapy. If she was found to be intermediate risk, we would need to evaluate the score as well as other risk factors and determine if an abbreviated chemotherapy may be of benefit.  Return to clinic after surgery to discuss final pathology report and then determine if Oncotype DX testing will need to be sent.

## 2019-05-12 ENCOUNTER — Telehealth: Payer: Self-pay | Admitting: Internal Medicine

## 2019-05-12 MED ORDER — ENSURE PRE-SURGERY PO LIQD: 296.0000 mL | Freq: Once | ORAL | Status: DC

## 2019-05-12 NOTE — Progress Notes (Signed)

## 2019-05-12 NOTE — Telephone Encounter (Signed)
Advised that patient have a BP check with Allyne Gee or if able have visit scheduled during open slot of 9:50 on 4/16.   Phill Myron, D.O. Primary Care at [facility]  05/12/2019, 11:25 AM

## 2019-05-12 NOTE — Telephone Encounter (Signed)
Patient is concerned about blood pressure medication. Patient is swelling and feeling dizzy. Patient has surgery scheduled for Tuesday, 04/20. Please contact patient as soon as possible.

## 2019-05-13 ENCOUNTER — Other Ambulatory Visit: Payer: Self-pay

## 2019-05-13 ENCOUNTER — Other Ambulatory Visit (HOSPITAL_COMMUNITY)
Admission: RE | Admit: 2019-05-13 | Discharge: 2019-05-13 | Disposition: A | Discharge status: Home / Self Care | Payer: Medicare Other | Source: Ambulatory Visit | Attending: Surgery | Admitting: Surgery

## 2019-05-13 ENCOUNTER — Encounter: Payer: Self-pay | Admitting: Internal Medicine

## 2019-05-13 ENCOUNTER — Ambulatory Visit (INDEPENDENT_AMBULATORY_CARE_PROVIDER_SITE_OTHER): Payer: Medicare Other | Admitting: Internal Medicine

## 2019-05-13 VITALS — BP 147/78 | HR 109 | Temp 98.1°F | Resp 17 | Wt 112.0 lb

## 2019-05-13 DIAGNOSIS — Z20822 Contact with and (suspected) exposure to covid-19: Secondary | ICD-10-CM | POA: Diagnosis not present

## 2019-05-13 DIAGNOSIS — I10 Essential (primary) hypertension: Secondary | ICD-10-CM

## 2019-05-13 DIAGNOSIS — Z01812 Encounter for preprocedural laboratory examination: Secondary | ICD-10-CM | POA: Insufficient documentation

## 2019-05-13 DIAGNOSIS — R600 Localized edema: Secondary | ICD-10-CM

## 2019-05-13 LAB — SARS CORONAVIRUS 2 (TAT 6-24 HRS): SARS Coronavirus 2: NEGATIVE

## 2019-05-13 MED ORDER — AMLODIPINE BESYLATE 5 MG PO TABS: 5.0000 mg | ORAL_TABLET | Freq: Every day | ORAL | 1 refills | Status: DC

## 2019-05-13 NOTE — Progress Notes (Signed)
  Subjective:    Barbara Green - 79 y.o. female MRN [id number]  Date of birth: Jan 01, 1941  HPI  Barbara Green is here for concerns related to her medications. At her prior visit, we increased Amlodipine from 5 to 10 mg and added Losartan 25 mg. Since then she has had bilateral LE edema in her feet and ankles. It is painful and feels tight. Improves somewhat with elevation of her feet. She has surgery next Tuesday related to her breast cancer and wants to try to improve these symptoms prior to that operation.    Health Maintenance:  Health Maintenance Due  Topic Date Due  . DEXA SCAN  Never done    -  reports that she has been smoking. She has a 9.90 pack-year smoking history. She has never used smokeless tobacco. - Review of Systems: Per HPI. - Past Medical History: Patient Active Problem List   Diagnosis Date Noted  . Malignant neoplasm of upper-inner quadrant of right breast in female, estrogen receptor positive ([place]) 04/20/2019  . Tobacco dependence 02/08/2019  . Unspecified lump in the right breast, upper inner quadrant  02/08/2019  . Essential hypertension 02/08/2019  . Tachycardia 02/08/2019   - Medications: reviewed and updated   Objective:   Physical Exam BP (!) 147/78   Pulse (!) 109   Temp 98.1 F (36.7 C) (Temporal)   Resp 17   Wt 50.8 kg   SpO2 96%   BMI 22.62 kg/m  Physical Exam  Constitutional: She is oriented to person, place, and time and well-developed, well-nourished, and in no distress. No distress.  Cardiovascular: Normal rate.  1+ pitting edema to the ankles bilaterally. Negative Homan's sign bilaterally. No palpable cords.   Pulmonary/Chest: Effort normal. No respiratory distress.  Musculoskeletal:        General: Normal range of motion.  Neurological: She is alert and oriented to person, place, and time.  Skin: Skin is warm and dry. She is not diaphoretic.  Psychiatric: Affect and judgment normal.           Assessment & Plan:   1.  Essential hypertension Will decrease Amlodipine dose back down to 5 mg. Continue Losartan 25 mg. Patient has more optimal BP control than trend over the past year; however still above goal. Will plan to re-evaluate after surgery. Could consider increase of Losartan dose at that time. However, patient did have hyperkalemia that resolved after starting the medication.  - amLODipine (NORVASC) 5 MG tablet; Take 1 tablet (5 mg total) by mouth daily.  Dispense: 90 tablet; Refill: 1  2. Lower extremity edema Likely related to side effect of Amlodipine given relationship to dose increase. No evidence of DVT on exam. Trial decrease of medication to see if improves edema. If does not improve edema, will need re-evaluation.      Phill Myron, D.O. 05/14/2019, 8:04 PM Primary Care at [facility]

## 2019-05-16 NOTE — H&P (Signed)
  Barbara Green Documented: 04/22/2019 10:35 AM Location: [facility] Patient #: [id number] DOB: 05/07/1940 Married / Language: English / Race: White Female   History of Present Illness (Hezzie Karim A. Ninfa Linden MD; 04/22/2019 10:58 AM) The patient is a 79 year old female who presents with breast cancer. She is here for a follow-up  She has since had a mammogram, all showing, and breast biopsy. The mass is approximately 1.6 cm in size along the sternum and is visible on exam. Pathology of the biopsy showed an invasive ductal carcinoma which was ER/PR positive and a Ki-67 of 5%. It was HER-2 negative.   Medication History (Armen Ferguson, CMA; 04/22/2019 10:36 AM) Losartan Potassium ('25MG'$  Tablet, Oral) Active. amLODIPine Besylate ('5MG'$  Tablet, Oral) Active. Medications Reconciled  Vitals (Armen Ferguson CMA; 04/22/2019 10:36 AM) 04/22/2019 10:35 AM Weight: 116 lb Height: 59in Body Surface Area: 1.46 m Body Mass Index: 23.43 kg/m  Temp.: 97.57F  Pulse: 129 (Regular)  P.OX: 97% (Room air) BP: 150/98 (Sitting, Left Arm, Standard)       Physical Exam (Caide Campi A. Ninfa Linden MD; 04/22/2019 10:59 AM) The physical exam findings are as follows: Note:Again on physical examination, there is a firm fixed nodule 1/2 cm in size along the edge of the sternum of the right breast    Assessment & Plan (Lawai A. Ninfa Linden MD; 04/22/2019 11:01 AM) BREAST CANCER, RIGHT (C50.911) Impression: I discussed with the patient regarding the new diagnosis of breast cancer. 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 This would necessitate a right breast lumpectomy and sentinel lymph node biopsy to make sure cancer does not spread to lymph nodes. She will be referred to medical and radiation oncology for their opinions as well. I discussed the procedure with her in detail. I discussed the risk which includes but is not limited to  bleeding, infection, injury to surrounding structures, the need for further surgery if margins are positive, cardiopulmonary issues, arm swelling, etc. She understands and wished to proceed with surgery which will be scheduled.

## 2019-05-17 ENCOUNTER — Ambulatory Visit (HOSPITAL_BASED_OUTPATIENT_CLINIC_OR_DEPARTMENT_OTHER)
Admission: RE | Admit: 2019-05-17 | Discharge: 2019-05-17 | Disposition: A | Discharge status: Home / Self Care | Payer: Medicare Other | Attending: Surgery | Admitting: Surgery

## 2019-05-17 ENCOUNTER — Encounter (HOSPITAL_BASED_OUTPATIENT_CLINIC_OR_DEPARTMENT_OTHER): Payer: Self-pay | Admitting: Surgery

## 2019-05-17 ENCOUNTER — Ambulatory Visit (HOSPITAL_COMMUNITY)
Admission: RE | Admit: 2019-05-17 | Discharge: 2019-05-17 | Disposition: A | Discharge status: Home / Self Care | Payer: Medicare Other | Source: Ambulatory Visit | Attending: Surgery | Admitting: Surgery

## 2019-05-17 ENCOUNTER — Encounter (HOSPITAL_BASED_OUTPATIENT_CLINIC_OR_DEPARTMENT_OTHER)
Admission: RE | Disposition: A | Discharge status: Home / Self Care | Payer: Self-pay | Source: Home / Self Care | Attending: Surgery

## 2019-05-17 ENCOUNTER — Other Ambulatory Visit: Payer: Self-pay

## 2019-05-17 ENCOUNTER — Ambulatory Visit (HOSPITAL_BASED_OUTPATIENT_CLINIC_OR_DEPARTMENT_OTHER): Payer: Medicare Other | Admitting: Certified Registered"

## 2019-05-17 DIAGNOSIS — I10 Essential (primary) hypertension: Secondary | ICD-10-CM | POA: Diagnosis not present

## 2019-05-17 DIAGNOSIS — F17200 Nicotine dependence, unspecified, uncomplicated: Secondary | ICD-10-CM | POA: Diagnosis not present

## 2019-05-17 DIAGNOSIS — C50911 Malignant neoplasm of unspecified site of right female breast: Secondary | ICD-10-CM | POA: Diagnosis present

## 2019-05-17 DIAGNOSIS — Z79899 Other long term (current) drug therapy: Secondary | ICD-10-CM | POA: Diagnosis not present

## 2019-05-17 DIAGNOSIS — Z853 Personal history of malignant neoplasm of breast: Secondary | ICD-10-CM

## 2019-05-17 DIAGNOSIS — Z170 Estrogen receptor positive status [ER+]: Secondary | ICD-10-CM | POA: Diagnosis not present

## 2019-05-17 DIAGNOSIS — C50211 Malignant neoplasm of upper-inner quadrant of right female breast: Secondary | ICD-10-CM | POA: Diagnosis not present

## 2019-05-17 HISTORY — PX: BREAST LUMPECTOMY WITH SENTINEL LYMPH NODE BIOPSY: SHX5597

## 2019-05-17 HISTORY — DX: Essential (primary) hypertension: I10

## 2019-05-17 SURGERY — BREAST LUMPECTOMY WITH SENTINEL LYMPH NODE BX
Anesthesia: General | Site: Breast | Laterality: Right

## 2019-05-17 MED ORDER — CHLORHEXIDINE GLUCONATE CLOTH 2 % EX PADS: 6.0000 | MEDICATED_PAD | Freq: Once | CUTANEOUS | Status: DC

## 2019-05-17 MED ORDER — MIDAZOLAM HCL 2 MG/2ML IJ SOLN
INTRAMUSCULAR | Status: AC
  Filled 2019-05-17: qty 2

## 2019-05-17 MED ORDER — CEFAZOLIN SODIUM-DEXTROSE 2-4 GM/100ML-% IV SOLN
2.0000 g | INTRAVENOUS | Status: AC
  Administered 2019-05-17: 2 g via INTRAVENOUS

## 2019-05-17 MED ORDER — ACETAMINOPHEN 500 MG PO TABS
1000.0000 mg | ORAL_TABLET | ORAL | Status: AC
  Administered 2019-05-17: 1000 mg via ORAL

## 2019-05-17 MED ORDER — ONDANSETRON HCL 4 MG/2ML IJ SOLN
INTRAMUSCULAR | Status: DC | PRN
  Administered 2019-05-17: 4 mg via INTRAVENOUS

## 2019-05-17 MED ORDER — CEFAZOLIN SODIUM-DEXTROSE 2-4 GM/100ML-% IV SOLN
INTRAVENOUS | Status: AC
  Filled 2019-05-17: qty 100

## 2019-05-17 MED ORDER — LACTATED RINGERS IV SOLN: INTRAVENOUS | Status: DC

## 2019-05-17 MED ORDER — GABAPENTIN 300 MG PO CAPS: 300.0000 mg | ORAL_CAPSULE | ORAL | Status: DC

## 2019-05-17 MED ORDER — GABAPENTIN 300 MG PO CAPS
ORAL_CAPSULE | ORAL | Status: AC
  Filled 2019-05-17: qty 1

## 2019-05-17 MED ORDER — TECHNETIUM TC 99M SULFUR COLLOID FILTERED
1.0000 | Freq: Once | INTRAVENOUS | Status: AC | PRN
  Administered 2019-05-17: 14:00:00 1 via INTRADERMAL

## 2019-05-17 MED ORDER — FENTANYL CITRATE (PF) 100 MCG/2ML IJ SOLN
INTRAMUSCULAR | Status: AC
  Filled 2019-05-17: qty 2

## 2019-05-17 MED ORDER — FENTANYL CITRATE (PF) 100 MCG/2ML IJ SOLN
50.0000 ug | INTRAMUSCULAR | Status: DC | PRN
  Administered 2019-05-17: 50 ug via INTRAVENOUS

## 2019-05-17 MED ORDER — EPHEDRINE SULFATE 50 MG/ML IJ SOLN
INTRAMUSCULAR | Status: DC | PRN
  Administered 2019-05-17: 10 mg via INTRAVENOUS

## 2019-05-17 MED ORDER — FENTANYL CITRATE (PF) 100 MCG/2ML IJ SOLN
INTRAMUSCULAR | Status: DC | PRN
  Administered 2019-05-17 (×2): 25 ug via INTRAVENOUS

## 2019-05-17 MED ORDER — PROMETHAZINE HCL 25 MG/ML IJ SOLN: 6.2500 mg | INTRAMUSCULAR | Status: DC | PRN

## 2019-05-17 MED ORDER — BUPIVACAINE HCL (PF) 0.25 % IJ SOLN
INTRAMUSCULAR | Status: DC | PRN
  Administered 2019-05-17: 14 mL

## 2019-05-17 MED ORDER — LIDOCAINE 2% (20 MG/ML) 5 ML SYRINGE
INTRAMUSCULAR | Status: AC
  Filled 2019-05-17: qty 5

## 2019-05-17 MED ORDER — PROPOFOL 10 MG/ML IV BOLUS
INTRAVENOUS | Status: AC
  Filled 2019-05-17: qty 20

## 2019-05-17 MED ORDER — OXYCODONE HCL 5 MG PO TABS: 5.0000 mg | ORAL_TABLET | Freq: Once | ORAL | Status: DC | PRN

## 2019-05-17 MED ORDER — ACETAMINOPHEN 500 MG PO TABS
ORAL_TABLET | ORAL | Status: AC
  Filled 2019-05-17: qty 2

## 2019-05-17 MED ORDER — OXYCODONE HCL 5 MG/5ML PO SOLN: 5.0000 mg | Freq: Once | ORAL | Status: DC | PRN

## 2019-05-17 MED ORDER — TRAMADOL HCL 50 MG PO TABS: 50.0000 mg | ORAL_TABLET | Freq: Four times a day (QID) | ORAL | 0 refills | Status: DC | PRN

## 2019-05-17 MED ORDER — PROPOFOL 10 MG/ML IV BOLUS
INTRAVENOUS | Status: DC | PRN
  Administered 2019-05-17: 100 mg via INTRAVENOUS

## 2019-05-17 MED ORDER — MIDAZOLAM HCL 2 MG/2ML IJ SOLN
1.0000 mg | INTRAMUSCULAR | Status: DC | PRN
  Administered 2019-05-17: 1 mg via INTRAVENOUS

## 2019-05-17 MED ORDER — HYDROMORPHONE HCL 1 MG/ML IJ SOLN: 0.2500 mg | INTRAMUSCULAR | Status: DC | PRN

## 2019-05-17 MED ORDER — ROPIVACAINE HCL 5 MG/ML IJ SOLN
INTRAMUSCULAR | Status: DC | PRN
  Administered 2019-05-17: 30 mL via PERINEURAL

## 2019-05-17 MED ORDER — DEXAMETHASONE SODIUM PHOSPHATE 4 MG/ML IJ SOLN
INTRAMUSCULAR | Status: DC | PRN
  Administered 2019-05-17: 4 mg via INTRAVENOUS

## 2019-05-17 MED ORDER — LIDOCAINE HCL (CARDIAC) PF 100 MG/5ML IV SOSY
PREFILLED_SYRINGE | INTRAVENOUS | Status: DC | PRN
  Administered 2019-05-17: 30 mg via INTRAVENOUS

## 2019-05-17 MED ORDER — SODIUM CHLORIDE (PF) 0.9 % IJ SOLN
INTRAVENOUS | Status: DC | PRN
  Administered 2019-05-17: 5 mL via INTRAMUSCULAR

## 2019-05-17 SURGICAL SUPPLY — 60 items
ADH SKN CLS APL DERMABOND .7 (GAUZE/BANDAGES/DRESSINGS) ×1
APL PRP STRL LF DISP 70% ISPRP (MISCELLANEOUS) ×1
APPLIER CLIP 9.375 MED OPEN (MISCELLANEOUS)
APR CLP MED 9.3 20 MLT OPN (MISCELLANEOUS)
BINDER BREAST MEDIUM (GAUZE/BANDAGES/DRESSINGS) ×2 IMPLANT
BLADE SURG 15 STRL LF DISP TIS (BLADE) ×2 IMPLANT
BLADE SURG 15 STRL SS (BLADE) ×6
CANISTER SUCT 1200ML W/VALVE (MISCELLANEOUS) IMPLANT
CHLORAPREP W/TINT 26 (MISCELLANEOUS) ×3 IMPLANT
CLIP APPLIE 9.375 MED OPEN (MISCELLANEOUS) IMPLANT
COVER BACK TABLE 60X90IN (DRAPES) ×4 IMPLANT
COVER MAYO STAND STRL (DRAPES) ×3 IMPLANT
COVER PROBE W GEL 5X96 (DRAPES) ×3 IMPLANT
COVER WAND RF STERILE (DRAPES) IMPLANT
DERMABOND ADVANCED (GAUZE/BANDAGES/DRESSINGS) ×2
DERMABOND ADVANCED .7 DNX12 (GAUZE/BANDAGES/DRESSINGS) ×1 IMPLANT
DRAIN CHANNEL 19F RND (DRAIN) IMPLANT
DRAIN HEMOVAC 1/8 X 5 (WOUND CARE) IMPLANT
DRAPE LAPAROSCOPIC ABDOMINAL (DRAPES) ×3 IMPLANT
DRAPE UTILITY XL STRL (DRAPES) ×3 IMPLANT
ELECT REM PT RETURN 9FT ADLT (ELECTROSURGICAL) ×3
ELECTRODE REM PT RTRN 9FT ADLT (ELECTROSURGICAL) ×1 IMPLANT
EVACUATOR SILICONE 100CC (DRAIN) IMPLANT
GAUZE SPONGE 4X4 12PLY STRL LF (GAUZE/BANDAGES/DRESSINGS) IMPLANT
GLOVE BIO SURGEON STRL SZ 6.5 (GLOVE) ×1 IMPLANT
GLOVE BIO SURGEONS STRL SZ 6.5 (GLOVE) ×1
GLOVE BIOGEL PI IND STRL 7.0 (GLOVE) IMPLANT
GLOVE BIOGEL PI INDICATOR 7.0 (GLOVE) ×2
GLOVE SURG SIGNA 7.5 PF LTX (GLOVE) ×3 IMPLANT
GOWN STRL REUS W/ TWL LRG LVL3 (GOWN DISPOSABLE) ×1 IMPLANT
GOWN STRL REUS W/ TWL XL LVL3 (GOWN DISPOSABLE) ×1 IMPLANT
GOWN STRL REUS W/TWL LRG LVL3 (GOWN DISPOSABLE) ×3
GOWN STRL REUS W/TWL XL LVL3 (GOWN DISPOSABLE) ×3
HEMOSTAT SURGICEL 2X14 (HEMOSTASIS) IMPLANT
KIT MARKER MARGIN INK (KITS) ×3 IMPLANT
NDL HYPO 25X1 1.5 SAFETY (NEEDLE) ×2 IMPLANT
NDL SAFETY ECLIPSE 18X1.5 (NEEDLE) ×1 IMPLANT
NEEDLE HYPO 18GX1.5 SHARP (NEEDLE) ×3
NEEDLE HYPO 25X1 1.5 SAFETY (NEEDLE) ×6 IMPLANT
NS IRRIG 1000ML POUR BTL (IV SOLUTION) IMPLANT
PACK BASIN DAY SURGERY FS (CUSTOM PROCEDURE TRAY) ×3 IMPLANT
PENCIL SMOKE EVACUATOR (MISCELLANEOUS) ×3 IMPLANT
PIN SAFETY STERILE (MISCELLANEOUS) IMPLANT
SLEEVE SCD COMPRESS KNEE MED (MISCELLANEOUS) ×3 IMPLANT
SPONGE LAP 18X18 RF (DISPOSABLE) ×2 IMPLANT
SPONGE LAP 4X18 RFD (DISPOSABLE) ×3 IMPLANT
SUT ETHILON 2 0 FS 18 (SUTURE) ×2 IMPLANT
SUT ETHILON 3 0 PS 1 (SUTURE) IMPLANT
SUT MNCRL AB 4-0 PS2 18 (SUTURE) ×6 IMPLANT
SUT VIC AB 2-0 SH 27 (SUTURE) ×6
SUT VIC AB 2-0 SH 27XBRD (SUTURE) IMPLANT
SUT VIC AB 3-0 SH 27 (SUTURE) ×3
SUT VIC AB 3-0 SH 27X BRD (SUTURE) ×1 IMPLANT
SYR BULB EAR ULCER 3OZ GRN STR (SYRINGE) IMPLANT
SYR CONTROL 10ML LL (SYRINGE) ×6 IMPLANT
TOWEL GREEN STERILE FF (TOWEL DISPOSABLE) ×3 IMPLANT
TRAY FAXITRON CT DISP (TRAY / TRAY PROCEDURE) IMPLANT
TUBE CONNECTING 20'X1/4 (TUBING) ×1
TUBE CONNECTING 20X1/4 (TUBING) ×2 IMPLANT
YANKAUER SUCT BULB TIP NO VENT (SUCTIONS) ×2 IMPLANT

## 2019-05-17 NOTE — Progress Notes (Signed)
Emotional support during breast injections °

## 2019-05-17 NOTE — Op Note (Signed)
   Barbara Green 05/17/2019   Pre-op Diagnosis: RIGHT BREAST CANCER     Post-op Diagnosis: same  Procedure(s): RIGHT BREAST LUMPECTOMY WITH DEEP AXILLARY SENTINEL LYMPH NODE BIOPSY INJECTION OF BLUE DYE  Surgeon(s): Coralie Keens, MD  Anesthesia: General  Staff:  Circulator: Izora Ribas, RN Relief Circulator: Maurene Capes, RN Scrub Person: Lorenza Burton, CST  Estimated Blood Loss: Minimal               Specimens: sent to path  Indications: This is a 79 year old female with a right breast cancer adjacent to the sternum eroding through skin.  The decision has been made to proceed with a wide right breast lumpectomy and sentinel lymph node biopsy  Procedure: Patient brought to the operating room identifies correct patient.  She is placed upon the operating table general anesthesia was induced.  I next injected blue dye underneath the areola of the right breast and massaged the breast.  Right breast and axilla were then prepped and draped in usual sterile fashion.  The mass eroding through the skin on the right breast was adjacent to the sternum and was approximately 2 cm in size.  I anesthetized skin surrounding with Marcaine and then made elliptical incision with a scalpel staying widely around it.  I then dissected down circumferentially through the breast tissue to the underlying sternum and pectoralis muscle.  The mass appeared to be going only slightly into the muscle.  I excised it in its entirety including the underlying muscle with the cautery.  I then marked all margins with paint and sent to pathology for evaluation.  We irrigated the wound with saline.  I mobilized the breast superiorly inferiorly to the wound.  I then was able to reapproximate the underlying subcutaneous tissue and breast tissue with interrupted 2-0 Vicryl sutures and then closed the skin with interrupted 2-0 nylon sutures.  The most medial aspect on the breast was able closed with 4-0 Monocryl  and Dermabond.  I next turned my attention toward the axilla.  Identified an area of crease uptake in the right axilla with the neoprobe.  I made a small incision with a scalpel.  I dissected down to the deep right axillary tissue and with the aid of the neoprobe was able to identify 3 sentinel lymph nodes which were excised and then sent to pathology for evaluation.  The nodal basin was then free of radioactive isotope.  I could palpate no other enlarged lymph nodes.  Hemostasis peer to be achieved.  I then closed the subcutaneous tissue with interrupted 3-0 Vicryl sutures and closed skin with a running 4-0 Monocryl.  Dermabond was then applied.  The patient tolerated the procedure well.  All the counts were correct at the end the procedure.  The patient was then extubated in the operating room and taken in stable addition to the recovery room.          Coralie Keens   Date: 05/17/2019  Time: 3:03 PM

## 2019-05-17 NOTE — Progress Notes (Signed)
Assisted Dr. Miller with right, ultrasound guided, pectoralis block. Side rails up, monitors on throughout procedure. See vital signs in flow sheet. Tolerated Procedure well. 

## 2019-05-17 NOTE — Transfer of Care (Signed)
Immediate Anesthesia Transfer of Care Note  Patient: Barbara Green  Procedure(s) Performed: RIGHT BREAST LUMPECTOMY WITH SENTINEL LYMPH NODE BIOPSY (Right Breast)  Patient Location: PACU  Anesthesia Type:GA combined with regional for post-op pain  Level of Consciousness: drowsy and patient cooperative  Airway & Oxygen Therapy: Patient Spontanous Breathing and Patient connected to face mask oxygen  Post-op Assessment: Report given to RN and Post -op Vital signs reviewed and stable  Post vital signs: Reviewed and stable  Last Vitals:  Vitals Value Taken Time  BP 93/45 05/17/19 1507  Temp    Pulse 87 05/17/19 1510  Resp 15 05/17/19 1509  SpO2 100 % 05/17/19 1510  Vitals shown include unvalidated device data.  Last Pain:  Vitals:   05/17/19 1312  TempSrc: Temporal  PainSc: 0-No pain      Patients Stated Pain Goal: 1 ([id number])  Complications: No apparent anesthesia complications

## 2019-05-17 NOTE — Anesthesia Procedure Notes (Signed)
Procedure Name: LMA Insertion Date/Time: 05/17/2019 2:10 PM Performed by: Signe Colt, CRNA Pre-anesthesia Checklist: Patient identified, Emergency Drugs available, Suction available and Patient being monitored Patient Re-evaluated:Patient Re-evaluated prior to induction Oxygen Delivery Method: Circle system utilized Preoxygenation: Pre-oxygenation with 100% oxygen Induction Type: IV induction Ventilation: Mask ventilation without difficulty LMA: LMA inserted LMA Size: 4.0 Number of attempts: 1 Airway Equipment and Method: Bite block Placement Confirmation: positive ETCO2 Tube secured with: Tape Dental Injury: Teeth and Oropharynx as per pre-operative assessment

## 2019-05-17 NOTE — Interval H&P Note (Signed)
History and Physical Interval Note:no change in H and P  05/17/2019 1:49 PM  Barbara Green  has presented today for surgery, with the diagnosis of RIGHT BREAST CANCER.  The various methods of treatment have been discussed with the patient and family. After consideration of risks, benefits and other options for treatment, the patient has consented to  Procedure(s): RIGHT BREAST LUMPECTOMY WITH SENTINEL LYMPH NODE BX (Right) as a surgical intervention.  The patient's history has been reviewed, patient examined, no change in status, stable for surgery.  I have reviewed the patient's chart and labs.  Questions were answered to the patient's satisfaction.     Coralie Keens

## 2019-05-17 NOTE — Discharge Instructions (Signed)
[place] Office Phone Number [phone]  BREAST BIOPSY/ LUMPECTOMY: POST OP INSTRUCTIONS  Always review your discharge instruction sheet given to you by the facility where your surgery was performed.  IF YOU HAVE DISABILITY OR FAMILY LEAVE FORMS, YOU MUST BRING THEM TO THE OFFICE FOR PROCESSING.  DO NOT GIVE THEM TO YOUR DOCTOR.  1. A prescription for pain medication may be given to you upon discharge.  Take your pain medication as prescribed, if needed.  If narcotic pain medicine is not needed, then you may take acetaminophen (Tylenol) or ibuprofen (Advil) as needed. No Tylenol until 8:00pm if needed. 2. Take your usually prescribed medications unless otherwise directed 3. If you need a refill on your pain medication, please contact your pharmacy.  They will contact our office to request authorization.  Prescriptions will not be filled after 5pm or on week-ends. 4. You should eat very light the first 24 hours after surgery, such as soup, crackers, pudding, etc.  Resume your normal diet the day after surgery. 5. Most patients will experience some swelling and bruising in the breast.  Ice packs and a good support bra will help.  Swelling and bruising can take several days to resolve.  6. It is common to experience some constipation if taking pain medication after surgery.  Increasing fluid intake and taking a stool softener will usually help or prevent this problem from occurring.  A mild laxative (Milk of Magnesia or Miralax) should be taken according to package directions if there are no bowel movements after 48 hours. 7. Unless discharge instructions indicate otherwise, you may remove your bandages 24-48 hours after surgery, and you may shower at that time.  You may have steri-strips (small skin tapes) in place directly over the incision.  These strips should be left on the skin for 7-10 days.  If your surgeon used skin glue on the incision, you may shower in 24 hours.  The glue  will flake off over the next 2-3 weeks.  Any sutures or staples will be removed at the office during your follow-up visit. 8. ACTIVITIES:  You may resume regular daily activities (gradually increasing) beginning the next day.  Wearing a good support bra or sports bra minimizes pain and swelling.  You may have sexual intercourse when it is comfortable. a. You may drive when you no longer are taking prescription pain medication, you can comfortably wear a seatbelt, and you can safely maneuver your car and apply brakes. b. RETURN TO WORK:  ______________________________________________________________________________________ 9. You should see your doctor in the office for a follow-up appointment approximately two weeks after your surgery.  Your doctor's nurse will typically make your follow-up appointment when she calls you with your pathology report.  Expect your pathology report 2-3 business days after your surgery.  You may call to check if you do not hear from [place] after three days. 10. OTHER INSTRUCTIONS:YOU MAY SHOWER STARTING TOMORROW 11. ICE PACK, TYLENOL, AND IBUPROFEN ALSO FOR PAIN 12. NO VIGOROUS ACTIVITY FOR ONE WEEK _______________________________________________________________________________________________ _____________________________________________________________________________________________________________________________________ _____________________________________________________________________________________________________________________________________ _____________________________________________________________________________________________________________________________________  WHEN TO CALL YOUR DOCTOR: 1. Fever over 101.0 2. Nausea and/or vomiting. 3. Extreme swelling or bruising. 4. Continued bleeding from incision. 5. Increased pain, redness, or drainage from the incision.  The clinic staff is available to answer your questions during regular business hours.   Please don't hesitate to call and ask to speak to one of the nurses for clinical concerns.  If you have a medical emergency, go to the nearest emergency room  or call 911.  A surgeon from [facility] is always on call at the hospital.  For further questions, please visit [email or web address]    Post Anesthesia Home Care Instructions  Activity: Get plenty of rest for the remainder of the day. A responsible individual must stay with you for 24 hours following the procedure.  For the next 24 hours, DO NOT: -Drive a car -Paediatric nurse -Drink alcoholic beverages -Take any medication unless instructed by your physician -Make any legal decisions or sign important papers.  Meals: Start with liquid foods such as gelatin or soup. Progress to regular foods as tolerated. Avoid greasy, spicy, heavy foods. If nausea and/or vomiting occur, drink only clear liquids until the nausea and/or vomiting subsides. Call your physician if vomiting continues.  Special Instructions/Symptoms: Your throat may feel dry or sore from the anesthesia or the breathing tube placed in your throat during surgery. If this causes discomfort, gargle with warm salt water. The discomfort should disappear within 24 hours.  If you had a scopolamine patch placed behind your ear for the management of post- operative nausea and/or vomiting:  1. The medication in the patch is effective for 72 hours, after which it should be removed.  Wrap patch in a tissue and discard in the trash. Wash hands thoroughly with soap and water. 2. You may remove the patch earlier than 72 hours if you experience unpleasant side effects which may include dry mouth, dizziness or visual disturbances. 3. Avoid touching the patch. Wash your hands with soap and water after contact with the patch.

## 2019-05-17 NOTE — Anesthesia Postprocedure Evaluation (Signed)
Anesthesia Post Note  Patient: Barbara Green  Procedure(s) Performed: RIGHT BREAST LUMPECTOMY WITH SENTINEL LYMPH NODE BIOPSY (Right Breast)     Patient location during evaluation: PACU Anesthesia Type: General Level of consciousness: awake and alert Pain management: pain level controlled Vital Signs Assessment: post-procedure vital signs reviewed and stable Respiratory status: spontaneous breathing, nonlabored ventilation and respiratory function stable Cardiovascular status: blood pressure returned to baseline and stable Postop Assessment: no apparent nausea or vomiting Anesthetic complications: no    Last Vitals:  Vitals:   05/17/19 1515 05/17/19 1530  BP: (!) 101/56 (!) 112/59  Pulse: 86 100  Resp: 15 14  Temp:    SpO2: 100% 98%    Last Pain:  Vitals:   05/17/19 1530  TempSrc:   PainSc: 0-No pain                 Lynda Rainwater

## 2019-05-17 NOTE — Anesthesia Procedure Notes (Signed)
Anesthesia Regional Block: Pectoralis block   Pre-Anesthetic Checklist: ,, timeout performed, Correct Patient, Correct Site, Correct Laterality, Correct Procedure, Correct Position, site marked, Risks and benefits discussed,  Surgical consent,  Pre-op evaluation,  At surgeon's request and post-op pain management  Laterality: Right  Prep: chloraprep       Needles:  Injection technique: Single-shot  Needle Type: Stimiplex     Needle Length: 9cm  Needle Gauge: 21     Additional Needles:   Procedures:,,,, ultrasound used (permanent image in chart),,,,  Narrative:  Start time: 05/17/2019 1:52 PM End time: 05/17/2019 1:57 PM Injection made incrementally with aspirations every 5 mL.  Performed by: Personally  Anesthesiologist: Lynda Rainwater, MD

## 2019-05-17 NOTE — Anesthesia Preprocedure Evaluation (Signed)
Anesthesia Evaluation  Patient identified by MRN, date of birth, ID band Patient awake    Reviewed: Allergy & Precautions, NPO status , Patient's Chart, lab work & pertinent test results  Airway Mallampati: II  TM Distance: >3 FB Neck ROM: Full    Dental no notable dental hx.    Pulmonary neg pulmonary ROS, Current Smoker and Patient abstained from smoking.,    Pulmonary exam normal breath sounds clear to auscultation       Cardiovascular hypertension, Pt. on medications negative cardio ROS Normal cardiovascular exam Rhythm:Regular Rate:Normal     Neuro/Psych negative neurological ROS  negative psych ROS   GI/Hepatic negative GI ROS, Neg liver ROS,   Endo/Other  negative endocrine ROS  Renal/GU negative Renal ROS  negative genitourinary   Musculoskeletal negative musculoskeletal ROS (+)   Abdominal   Peds negative pediatric ROS (+)  Hematology negative hematology ROS (+)   Anesthesia Other Findings Breast Cancer  Reproductive/Obstetrics negative OB ROS                             Anesthesia Physical Anesthesia Plan  ASA: III  Anesthesia Plan: General   Post-op Pain Management:  Regional for Post-op pain   Induction: Intravenous  PONV Risk Score and Plan: 2 and Ondansetron, Midazolam and Treatment may vary due to age or medical condition  Airway Management Planned: LMA  Additional Equipment:   Intra-op Plan:   Post-operative Plan: Extubation in OR  Informed Consent: I have reviewed the patients History and Physical, chart, labs and discussed the procedure including the risks, benefits and alternatives for the proposed anesthesia with the patient or authorized representative who has indicated his/her understanding and acceptance.     Dental advisory given  Plan Discussed with: CRNA  Anesthesia Plan Comments:         Anesthesia Quick Evaluation

## 2019-05-18 ENCOUNTER — Encounter: Payer: Self-pay | Admitting: *Deleted

## 2019-05-20 LAB — SURGICAL PATHOLOGY

## 2019-05-23 ENCOUNTER — Encounter: Payer: Self-pay | Admitting: *Deleted

## 2019-05-24 ENCOUNTER — Encounter: Payer: Self-pay | Admitting: *Deleted

## 2019-05-26 NOTE — Progress Notes (Addendum)
 [facility]  [phone] [phone]  Clinic New Consult Note   Patient Care Team: Wallace, Catherine Lauren, DO as PCP - General (Family Medicine) Stuart, Dawn C, RN as Oncology Nurse Navigator Martini, Keisha N, RN as Medical Oncologist Blackman, Douglas, MD as Consulting Physician (General Surgery) Feng, Yan, MD as Consulting Physician (Hematology) Kinard, James, MD as Consulting Physician (Radiation Oncology) Burton, Lacie K, NP as Nurse Practitioner (Nurse Practitioner) Date of Service: 05/30/2019   CHIEF COMPLAINTS/PURPOSE OF CONSULTATION:  Right breast cancer, referred by Dr. Blackman   Oncology History  Malignant neoplasm of upper-inner quadrant of right breast in female, estrogen receptor positive (HCC)  04/07/2019 Mammogram   COMPARISON:  Baseline exam ACR Breast Density Category c: The breast tissue is heterogeneously dense, which may obscure small masses. FINDINGS: No suspicious mass, distortion, or microcalcifications are identified to suggest presence of malignancy. The area of patient's concern in the MEDIAL portion of the RIGHT breast is too MEDIAL to be evaluated on routine or spot compression views.   04/07/2019 Breast US   On physical exam, there is an erythematous nontender firm fixed 1-2 centimeter mass in the 3 o'clock parasternal region of the RIGHT breast. Targeted ultrasound is performed, showing an irregular hypoechoic mass with irregular margins and internal blood flow. Mass measures 1.6 x 1.4 x 1.5 centimeters. Mass appears to involve the pectoralis muscle, abutting the ribs and LATERAL margin of the sternum. No RIGHT axillary adenopathy. No internal mammary adenopathy seen sonographically. IMPRESSION: Suspicious mass in the 3 o'clock location of the RIGHT breast for which biopsy is recommended.   04/13/2019 Initial Diagnosis   Patient palpated an upper inner quadrant right breast mass for 2-3 months. Mammogram  and US showed a 1.6cm mass at the 3 o'clock position, no right axillary adenopathy. Biopsy showed IDC, grade 2, HER-2 equivocal by IHC, negative by FISH, ER+ 100%, PR+ 10%, Ki67 5%.    04/13/2019 Cancer Staging   Staging form: Breast, AJCC 8th Edition - Clinical stage from 04/13/2019: Stage IA (cT1c, cN0, cM0, G2, ER+, PR+, HER2-) - Signed by Causey, Lindsey Cornetto, NP on 04/20/2019   04/13/2019 Initial Biopsy   Diagnosis Breast, right, needle core biopsy, 3 o'clock - INVASIVE DUCTAL CARCINOMA, SEE COMMENT. Microscopic Comment The carcinoma appears grade 2 and measures 12 mm in greatest linear extent. There is perineural invasion present. IMMUNOHISTOCHEMICAL AND MORPHOMETRIC ANALYSIS PERFORMED MANUALLY The tumor cells are EQUIVOCAL for Her2 (2+). HER2 by FISH will be PERFORMED and the RESULTS REPORTED SEPARATLY Estrogen Receptor: 100%, POSITIVE, STRONG STAINING INTENSITY Progesterone Receptor: 10%, POSITIVE, STRONG STAINING INTENSITY Proliferation Marker Ki67: 5% FLUORESCENCE IN-SITU HYBRIDIZATION Results: GROUP 5: HER2 **NEGATIVE** Equivocal form of amplification of the HER2 gene was detected in the IHC 2+ tissue sample received from this individual. HER2 FISH was performed by a technologist and cell imaging and analysis on the [employer]. RATIO OF HER2/CEN17 SIGNALS 1.76 AVERAGE HER2 COPY NUMBER PER CELL 3.35   05/17/2019 Cancer Staging   Staging form: Breast, AJCC 8th Edition - Pathologic stage from 05/17/2019: Stage IA (pT1c, pN0, cM0, G2, ER+, PR+, HER2-) - Signed by Feng, Yan, MD on 05/29/2019   05/17/2019 Pathology Results   FINAL MICROSCOPIC DIAGNOSIS:  A. BREAST, RIGHT, LUMPECTOMY:  - Invasive ductal carcinoma, grade 2, 1.9 cm  - Carcinoma involves dermis but does not show epidermal involvement/  ulceration  - Carcinoma invades into underlying skeletal muscle and involves the  inked deep resection margin (over a length of 0.5 cm).  See   comment  - Carcinoma is 0.3 cm from superior  and inferior margins  - Lymphovascular and perineural invasion is present  - See oncology table  B. LYMPH NODE, RIGHT AXILLARY, SENTINEL, BIOPSY:  - Lymph node, negative for carcinoma (0/1)  C. LYMPH NODE, RIGHT AXILLARY, SENTINEL, BIOPSY:  - Lymph node, negative for carcinoma (0/1)  D. LYMPH NODE, RIGHT AXILLARY, SENTINEL, BIOPSY:  - Lymph node, negative for carcinoma (0/1)  E. LYMPH NODE, RIGHT AXILLARY, SENTINEL, BIOPSY:  - Lymph node, negative for carcinoma (0/1)  F. LYMPH NODE, RIGHT AXILLARY, SENTINEL, BIOPSY:  - Lymph node, negative for carcinoma (0/1)  G. LYMPH NODE, RIGHT AXILLARY, SENTINEL, BIOPSY:  - Lymph node, negative for carcinoma (0/1)  H. LYMPH NODE, RIGHT AXILLARY, SENTINEL, BIOPSY:  - Lymph node, negative for carcinoma (0/1)   COMMENT:  Tumor invades into pectoralis muscle but based on discussion with the  surgeon Dr. Blackman, there is no evidence of invasion into chest wall/  intercostal muscles.       HISTORY OF PRESENTING ILLNESS:  Barbara Green 79 y.o. female is here because of newly diagnosed right breast cancer. She self palpated a "knot" she thought was a cyst in Feb 2021. She underwent diagnostic bilateral mammogram on 04/07/19 that showed breast density category C and a suspicious mass in the 3 o'clock position of the right breast. Targeted US showed an irregular hypoechoic mass with irregular margins measuring 1.6 x1.4 x1.5 cm which appears to involve the pectoralis muscle and abut the ribs and lateral margin of the sternum. There was no right axillary or internal mammary adenopathy. Biopsy on 04/13/19 showed invasive ductal carcinoma, grade 2 measuring 12 mm in greatest dimension with perineural invasion, ER 100% positive, PR 10% positive, Ki67 5%, HER 2 (2+) equivocal by IHC but negative by FISH. On 05/17/19 she underwent right breast lumpectomy with deep axillary sentinel LN biopsy by Dr. Blackman. Final path showed invasive ductal carcinoma grade 2  measuring 1.9 cm involving the dermis but not epidermis. The carcinoma invades into underlying pectoralis muscle (but not into chest wall or intercostal muscles) and involves deep resection margin. Lymphovascular and perineural invasion are present. 0/7 sentinel lymph nodes were positive. This was staged pT1cN0, IA.    GYN HISTORY  Menarchal: age 14 LMP: age mid 50's HRT: None G3P3  Socially, she is married with 3 adult healthy children. She lives with her spouse who is dependent due to statin drugs he requires assistance with ADLs, meals, and mobility. Patient is the sole caregiver but they have home health 3 times per week. She is active, walks often. She is current daily smoker <1 PPD x30-40 years. She drinks wine 1-2 times per week. Denies other drug use. She has never had previous mammogram, colonoscopy, or PAP smear. Denies family history of cancer.   Today, she presents by herself. She has mild soreness at the surgical site and right axilla. She has good mobility. She denies fatigue or weight loss. Denies changes in bowel habits, bleeding, fever, chills, cough, chest pain, dyspnea, leg edema, bone/joint pain, or other concerns. She is recovering well from surgery. Has f/u with Dr. Blackman next week for suture removal.   MEDICAL HISTORY:  Past Medical History:  Diagnosis Date  . Hypertension     SURGICAL HISTORY: Past Surgical History:  Procedure Laterality Date  . BREAST LUMPECTOMY WITH SENTINEL LYMPH NODE BIOPSY Right 05/17/2019   Procedure: RIGHT BREAST LUMPECTOMY WITH SENTINEL LYMPH NODE BIOPSY;  Surgeon: Blackman, Douglas, MD;    Location: [place];  Service: General;  Laterality: Right;  . NO PAST SURGERIES      SOCIAL HISTORY: Social History   Socioeconomic History  . Marital status: Married    Spouse name: Not on file  . Number of children: 3  . Years of education: Not on file  . Highest education level: 12th grade  Occupational History  . Not on file   Tobacco Use  . Smoking status: Current Every Day Smoker    Packs/day: 0.33    Years: 30.00    Pack years: 9.90  . Smokeless tobacco: Never Used  Substance and Sexual Activity  . Alcohol use: Yes    Comment: occasionally a glass wine  . Drug use: Never  . Sexual activity: Not on file  Other Topics Concern  . Not on file  Social History Narrative  . Not on file   Social Determinants of Health   Financial Resource Strain:   . Difficulty of Paying Living Expenses:   Food Insecurity:   . Worried About Charity fundraiser in the Last Year:   . Arboriculturist in the Last Year:   Transportation Needs:   . Film/video editor (Medical):   Marland Kitchen Lack of Transportation (Non-Medical):   Physical Activity:   . Days of Exercise per Week:   . Minutes of Exercise per Session:   Stress:   . Feeling of Stress :   Social Connections:   . Frequency of Communication with Friends and Family:   . Frequency of Social Gatherings with Friends and Family:   . Attends Religious Services:   . Active Member of Clubs or Organizations:   . Attends Archivist Meetings:   Marland Kitchen Marital Status:   Intimate Partner Violence:   . Fear of Current or Ex-Partner:   . Emotionally Abused:   Marland Kitchen Physically Abused:   . Sexually Abused:     FAMILY HISTORY: Family History  Problem Relation Age of Onset  . Alzheimer's disease Mother   . Heart disease Father   . Alcohol abuse Father     ALLERGIES:  has No Known Allergies.  MEDICATIONS:  Current Outpatient Medications  Medication Sig Dispense Refill  . amLODipine (NORVASC) 5 MG tablet Take 1 tablet (5 mg total) by mouth daily. 90 tablet 1  . losartan (COZAAR) 25 MG tablet Take 1 tablet (25 mg total) by mouth daily. 90 tablet 1   No current facility-administered medications for this visit.    REVIEW OF SYSTEMS:   Constitutional: Denies fevers, chills or abnormal night sweats Eyes: Denies blurriness of vision, double vision or watery eyes Ears,  nose, mouth, throat, and face: Denies mucositis or sore throat Respiratory: Denies cough, dyspnea or wheezes Cardiovascular: Denies palpitation, chest discomfort or lower extremity swelling Gastrointestinal:  Denies nausea, vomiting, constipation, diarrhea, bleeding, pain, heartburn or change in bowel habits Skin: Denies abnormal skin rashes Lymphatics: Denies new lymphadenopathy or easy bruising Neurological:Denies numbness, tingling or new weaknesses Behavioral/Psych: Mood is stable, no new changes  All other systems were reviewed with the patient and are negative.  PHYSICAL EXAMINATION: ECOG PERFORMANCE STATUS: 0 - Asymptomatic  Vitals:   05/30/19 1407  BP: (!) 162/87  Pulse: (!) 117  Temp: 98.5 F (36.9 C)  SpO2: 99%   Filed Weights   05/30/19 1407  Weight: 113 lb 14.4 oz (51.7 kg)    GENERAL:alert, no distress and comfortable SKIN: no rash  EYES: sclera clear NECK: without mass  LYMPH:  no palpable cervical, supraclavicular, or axillary lymphadenopathy  LUNGS: clear with normal breathing effort HEART: tachycardia, regular rhythm, no murmurs. No lower extremity edema ABDOMEN: abdomen soft, non-tender and normal bowel sounds PSYCH: alert & oriented x 3 with fluent speech NEURO: no focal motor/sensory deficits BREAST: s/p right lumpectomy, axillary incision closed with local soft tissue edema, no erythema or drainage. Right upper inner quadrant incision closed with sutures, no erythema or drainage. No palpable mass in either breast or axilla that I could appreciate.   LABORATORY DATA:  I have reviewed the data as listed CBC Latest Ref Rng & Units 02/08/2019  WBC 3.4 - 10.8 x10E3/uL 10.4  Hemoglobin 11.1 - 15.9 g/dL 14.8  Hematocrit 34.0 - 46.6 % 43.8  Platelets 150 - 450 x10E3/uL 335   CMP Latest Ref Rng & Units 03/23/2019 02/23/2019 02/08/2019  Glucose 65 - 99 mg/dL 108(H) - 105(H)  BUN 8 - 27 mg/dL 9 - 6(L)  Creatinine 0.57 - 1.00 mg/dL 0.53(L) - 0.51(L)  Sodium 134  - 144 mmol/L 139 - 133(L)  Potassium 3.5 - 5.2 mmol/L 5.2 4.8 5.5(H)  Chloride 96 - 106 mmol/L 100 - 95(L)  CO2 20 - 29 mmol/L 22 - 25  Calcium 8.7 - 10.3 mg/dL 10.2 - 9.6  Total Protein 6.0 - 8.5 g/dL - - 6.9  Total Bilirubin 0.0 - 1.2 mg/dL - - 0.5  Alkaline Phos 39 - 117 IU/L - - 86  AST 0 - 40 IU/L - - 23  ALT 0 - 32 IU/L - - 23    RADIOGRAPHIC STUDIES: I have personally reviewed the radiological images as listed and agreed with the findings in the report. NM Sentinel Node Inj-No Rpt (Breast)  Result Date: 05/17/2019 Sulfur colloid was injected by the nuclear medicine technologist for melanoma sentinel node.    ASSESSMENT & PLAN: 79 yo female with   1. Malignant neoplasm of upper inner quadrant for right breast, invasive ductal carcinoma pT1cpN0 grade 2, ER 100% (+) PR 10% (+) HER2 (+) by FISH, Ki67 5%, stage IA -we reviewed her medical record in detail with the patient. She presented for first mammogram with palpable nodule in the far right upper inner quadrant near the chest wall. She underwent right lumpectomy and deep axillary SLNB by Dr. Ninfa Linden on 05/17/19. She is recovering well from surgery.  -she will f/u with Dr. Ninfa Linden on 5/10 for suture removal.  -We reviewed her final pathology which shows invasive ductal carcinoma spanning 1.9 cm, grade 2 which was removed surgically, 0/7 LNs positive. Ki67 low at 5%, which predicts low risk disease. However she does have few high risk features that increases her risk of local recurrence including carcinoma involving the dermis, underlying skeletal muscle, and a 0.5 cm length of deep margin. LVI and PNI also present. Tumor was overall pT1cpN0, stage IA -she meets criteria for oncotype testing to get a better sense of her recurrence risk. Dr. Burr Medico would likely not offer chemo unless she has extremely high risk of recurrence. Patient is interested in finding out to make a more informed decision. Will request testing.  -to reduce her local  recurrence risk she is agreeable to undergo adjust radiation. She has met with Dr. Sondra Come before surgery. I have referred her back to him to discuss.  -Given the strong ER and PR positivity, we are recommending adjuvant aromatase inhibitor to reduce her risk of cancer recurrence,  The potential benefit and side effects which include but are not limited to  hot flash, skin and vaginal dryness, metabolic changes ( increased blood glucose, cholesterol, weight, etc.), slightly in increased risk of cardiovascular disease, cataracts, muscular and joint discomfort, osteopenia and osteoporosis, etc, were discussed with her in great details. She is interested, and we'll start after she completes radiation. -Her case will be discussed in breast tumor board next week, to review surgical path and any additional recommendations.  -we briefly discussed the surveillance plan with includes annual mammogram, lab and physical breast exam q3-4 months for 2 years, then q6-12 months for up to 5 years.  -If chemotherapy is not offered, we will see her back after radiation to finalize her anti-estrogen plan.  -She agrees to do DEXA before we meet again, to help [place] select the most appropriate endocrine therapy for her.   2. HTN -recently diagnosed during breast cancer work up -on amlodipine and losartan   3. Health maintenance  -She had never had mammogram, colonoscopy, or PAP smear -I encouraged her to remain active, eat and drink well during treatment to reduce recurrence risk and promote healthy lifestyle   PLAN: -Medical record reviewed  -Present in breast tumor board next week  -Order oncotype  -F/u pending oncotype result, if no chemotherapy is offered, will see her back after radiation to discuss anti-estrogen therapy  -DEXA before next visit   Orders Placed This Encounter  Procedures  . DG Bone Density    Standing Status:   Future    Standing Expiration Date:   05/30/2020    Order Specific Question:   Reason  for Exam (SYMPTOM  OR DIAGNOSIS REQUIRED)    Answer:   baseline prior to antiestrogen therapy    Order Specific Question:   Preferred imaging location?    Answer:   [facility]    All questions were answered. The patient knows to call the clinic with any problems, questions or concerns.     Alla Feeling, NP 05/31/2019    Addendum   I have seen the patient, examined her. I agree with the assessment and and plan and have edited the notes.   Barbara Green presented with palpable right breast mass, biopsy confirmed invasive ductal carcinoma.  She is status post right breast lumpectomy and sentinel lymph node biopsy, which revealed a 1.9 cm tumor, grade 2, ER/PR positive and HER-2 negative with low Ki67.  Her cancer invaded pectoral muscle, her deeper margin was positive.  Dr. Ninfa Linden does not plan to offer more surgery.  We discussed high risk of local recurrence, and the patient is agreeable with adjuvant radiation.  We discussed the risk of distant recurrence, and Oncotype DX genomic testing for risk stratification.  We discussed if Oncotype shows high risk disease, she would benefit from adjuvant chemotherapy to reduce disc.  Given the low Ki-67, strongly ER positive HER-2 negative disease, I suspect this is low risk disease.  Patient is elderly, and is the primary caregiver for her husband, so she is not interested in chemotherapy.  However she would like to know her risk of recurrence, so we will proceed with Oncotype test.  We discussed the benefit and risk of adjuvant antiestrogen therapy, and I strongly recommend her to consider aromatase inhibitor.  She is interested.  Plan to see her back after radiation to initiate adjuvant endocrine therapy.  All questions were answered.  Truitt Merle  05/30/2019

## 2019-05-30 ENCOUNTER — Other Ambulatory Visit: Payer: Self-pay

## 2019-05-30 ENCOUNTER — Inpatient Hospital Stay: Payer: Medicare Other | Attending: Nurse Practitioner | Admitting: Nurse Practitioner

## 2019-05-30 VITALS — BP 162/87 | HR 117 | Temp 98.5°F | Ht 59.0 in | Wt 113.9 lb

## 2019-05-30 DIAGNOSIS — Z79899 Other long term (current) drug therapy: Secondary | ICD-10-CM | POA: Diagnosis not present

## 2019-05-30 DIAGNOSIS — I10 Essential (primary) hypertension: Secondary | ICD-10-CM | POA: Insufficient documentation

## 2019-05-30 DIAGNOSIS — Z170 Estrogen receptor positive status [ER+]: Secondary | ICD-10-CM | POA: Insufficient documentation

## 2019-05-30 DIAGNOSIS — E2839 Other primary ovarian failure: Secondary | ICD-10-CM

## 2019-05-30 DIAGNOSIS — C50211 Malignant neoplasm of upper-inner quadrant of right female breast: Secondary | ICD-10-CM | POA: Diagnosis present

## 2019-05-31 ENCOUNTER — Encounter: Payer: Self-pay | Admitting: *Deleted

## 2019-05-31 ENCOUNTER — Telehealth: Payer: Self-pay | Admitting: *Deleted

## 2019-05-31 ENCOUNTER — Encounter: Payer: Self-pay | Admitting: Nurse Practitioner

## 2019-05-31 NOTE — Telephone Encounter (Signed)
Ordered oncotype per Dr. Feng.  Faxed requisition to pathology.   

## 2019-06-01 ENCOUNTER — Encounter: Payer: Self-pay | Admitting: *Deleted

## 2019-06-07 ENCOUNTER — Encounter (HOSPITAL_COMMUNITY): Payer: Self-pay | Admitting: Hematology

## 2019-06-09 ENCOUNTER — Encounter: Payer: Self-pay | Admitting: *Deleted

## 2019-06-09 ENCOUNTER — Telehealth: Payer: Self-pay | Admitting: *Deleted

## 2019-06-09 NOTE — Telephone Encounter (Signed)
Received oncotype score of 20. Physician team notified. Called pt to discuss no chemo recommended based on these results. Received verbal understanding. Confirmed appt with Dr. Sondra Come to discuss xrt.

## 2019-06-15 NOTE — Progress Notes (Signed)
Signed        Patient here to see Dr. Sondra Come for a f/u appointment with Dr. Sondra Come.   Location of Breast Cancer: RIGHT breast  Histology per Pathology Report: Diagnosis Breast, right, needle core biopsy, 3 o'clock - INVASIVE DUCTAL CARCINOMA, SEE COMMENT. Microscopic Comment The carcinoma appears grade 2 and measures 12 mm in greatest linear extent. There is perineural invasion present.  Receptor Status: ER(100%), PR (10%), Her2-neu (negative), Ki-(5%)  Did patient present with symptoms (if so, please note symptoms) or was this found on screening mammography?: Per Urgent Care Note 01/07/19:  79 year old female with no documented medical history, without PCP care/follow up for 10+ years comes in for right breast/chest mass. She is unsure how long mass has been present, noticed few weeks ago after showering. Denies pain, itching. Denies changes in size. Denies chest pain, shortness of breath, palpitations. Denies nipple discharge, skin changes. Denies personal or family history of breast cancer. Denies ever getting mammograms.   Past/Anticipated interventions by surgeon, if any: 05/17/19: RIGHT BREAST LUMPECTOMY WITH SENTINEL LYMPH NODE BX Coralie Keens, MD   05/17/19    Pathology Results   FINAL MICROSCOPIC DIAGNOSIS:  A. BREAST, RIGHT, LUMPECTOMY:  - Invasive ductal carcinoma, grade 2, 1.9 cm  - Carcinoma involves dermis but does not show epidermal involvement/  ulceration  - Carcinoma invades into underlying skeletal muscle and involves the  inked deep resection margin (over a length of 0.5 cm). See comment  - Carcinoma is 0.3 cm from superior and inferior margins  - Lymphovascular and perineural invasion is present  - See oncology table  B. LYMPH NODE, RIGHT AXILLARY, SENTINEL, BIOPSY:  - Lymph node, negative for carcinoma (0/1)  C. LYMPH NODE, RIGHT AXILLARY, SENTINEL, BIOPSY:  - Lymph node, negative for carcinoma (0/1)  D. LYMPH NODE, RIGHT AXILLARY, SENTINEL,  BIOPSY:  - Lymph node, negative for carcinoma (0/1)  E. LYMPH NODE, RIGHT AXILLARY, SENTINEL, BIOPSY:  - Lymph node, negative for carcinoma (0/1)  F. LYMPH NODE, RIGHT AXILLARY, SENTINEL, BIOPSY:  - Lymph node, negative for carcinoma (0/1)  G. LYMPH NODE, RIGHT AXILLARY, SENTINEL, BIOPSY:  - Lymph node, negative for carcinoma (0/1)  H. LYMPH NODE, RIGHT AXILLARY, SENTINEL, BIOPSY:  - Lymph node, negative for carcinoma (0/1)   COMMENT:  Tumor invades into pectoralis muscle but based on discussion with the  surgeon Dr. Ninfa Linden, there is no evidence of invasion into chest wall/  intercostal muscles.        Past/Anticipated interventions by medical oncology, if any: Chemotherapy None at this time.None  Lymphedema issues, if TKT:CCEQ Pain issues, if any:  Pt. Reports soreness under right arm.  SAFETY ISSUES:  Prior radiation? no  Pacemaker/ICD? no  Possible current pregnancy: postmenopausal  Is the patient on methotrexate? no  BP (!) 155/97 (BP Location: Left Arm, Patient Position: Sitting)   Pulse (!) 122   Temp 97.7 F (36.5 C) (Temporal)   Resp 18   Ht '4\' 11"'  (1.499 m)   Wt 113 lb 2 oz (51.3 kg)   SpO2 98%   BMI 22.85 kg/m     Wt Readings from Last 3 Encounters:  06/16/19 113 lb 2 oz (51.3 kg)  05/30/19 113 lb 14.4 oz (51.7 kg)  05/17/19 111 lb 12.4 oz (50.7 kg)

## 2019-06-16 ENCOUNTER — Encounter: Payer: Self-pay | Admitting: Radiation Oncology

## 2019-06-16 ENCOUNTER — Other Ambulatory Visit: Payer: Self-pay

## 2019-06-16 ENCOUNTER — Ambulatory Visit
Admission: RE | Admit: 2019-06-16 | Discharge: 2019-06-16 | Disposition: A | Discharge status: Home / Self Care | Payer: Medicare Other | Source: Ambulatory Visit | Attending: Radiation Oncology | Admitting: Radiation Oncology

## 2019-06-16 DIAGNOSIS — Z170 Estrogen receptor positive status [ER+]: Secondary | ICD-10-CM | POA: Insufficient documentation

## 2019-06-16 DIAGNOSIS — Z79899 Other long term (current) drug therapy: Secondary | ICD-10-CM | POA: Insufficient documentation

## 2019-06-16 DIAGNOSIS — C50211 Malignant neoplasm of upper-inner quadrant of right female breast: Secondary | ICD-10-CM | POA: Insufficient documentation

## 2019-06-16 NOTE — Progress Notes (Signed)
Radiation Oncology         [phone] ________________________________  Name: Barbara Green MRN: [id number]  Date: 06/16/2019  DOB: 27-Jul-1940  Re-Evaluation Note  CC: Nicolette Bang, DO  [place], [place]   1. Malignant neoplasm of upper-inner quadrant of right breast in female, estrogen receptor positive ([place])  C50.211    Z17.0     Diagnosis: Stage IA (pT1c, pN0, cM0) Right Breast UIQ, Invasive Ductal Carcinoma, ER+ / PR+ / Her2-, Grade 2  Narrative: The patient returns today to discuss radiation treatment options. She was seen in consultation on 04/27/2019. At that time, it was recommended that the patient proceed with right breast lumpectomy with sentinel node biopsy followed by adjuvant radiation therapy.  The patient proceeded with right breast lumpectomy with deep axillary sentinel lymph node biopsy on 05/17/2019 performed by Dr. Ninfa Linden. Pathology from the procedure revealed grade 2 invasive ductal carcinoma. The carcinoma involved dermis but did now show epidermal involvement/ulceration. The carcinoma invaded into underlying skeletal muscle and involved the inked deep resection margin; 0.3 cm from superior and inferior margins. There was lymphovascular and perineural invasion present. Seven right axillary sentinel lymph nodes were biopsied and all were negative for carcinoma.  The patient was seen by Cira Rue, oncology NP, on 05/30/2019. At that time, it was recommended that the patient proceed with Oncotype DX to determine whether or not chemotherapy would be beneficial. She will also proceed with adjuvant radiation therapy followed by antiestrogen therapy.  Oncotype DX was obtained on the final surgical sample and the recurrence score of 20 predicts a risk of recurrence outside the breast over the next 9 years of 6%, if the patient's only systemic therapy is an antiestrogen for 5 years.  It also predicts no significant benefit from  chemotherapy.  On review of systems, the patient reports soreness under right arm. She denies lymphedema and any other symptoms.  Patient recently saw Dr. Ninfa Linden to have good healing.  Patient denies any swelling in the axillary scar this has improved over the past couple of weeks   Allergies:  has No Known Allergies.  Meds: Current Outpatient Medications  Medication Sig Dispense Refill  . amLODipine (NORVASC) 5 MG tablet Take 1 tablet (5 mg total) by mouth daily. 90 tablet 1  . losartan (COZAAR) 25 MG tablet Take 1 tablet (25 mg total) by mouth daily. 90 tablet 1   No current facility-administered medications for this encounter.    Physical Findings: The patient is in no acute distress. Patient is alert and oriented.  height is _0  (1.499 m) and weight is 113 lb 2 oz (51.3 kg). Her temporal temperature is 97.7 F (36.5 C). Her blood pressure is 155/97 (abnormal) and her pulse is 122 (abnormal). Her respiration is 18 and oxygen saturation is 98%.   Lungs are clear to auscultation bilaterally. Heart has regular rate and rhythm. No palpable cervical, supraclavicular, or axillary adenopathy. Abdomen soft, non-tender, normal bowel sounds. Left breast: no palpable mass, nipple discharge or bleeding. Right breast: Well-healing scar in the lower inner quadrant of the breast.  The scar extends over the sternum area and slightly into the lower inner aspect of the left breast.  Patient has a separate scar in the axillary area from her sentinel node procedure also healing well.  Lab Findings: Lab Results  Component Value Date   WBC 10.4 02/08/2019   HGB 14.8 02/08/2019   HCT 43.8 02/08/2019   MCV 91 02/08/2019  PLT 335 02/08/2019    Radiographic Findings: No results found.  Impression: Stage IA (pT1c, pN0, cM0) Right Breast UIQ, Invasive Ductal Carcinoma, ER+ / PR+ / Her2-, Grade 2  Patient would be a good candidate for adjuvant radiation therapy as breast conserving surgery.  Given  the positive surgical margin, I would not recommend adjuvant hormonal therapy alone in this situation.  Patient's case was discussed at the multidisciplinary breast conference and there was potential discussion for completion mastectomy to clear margins.  This however is not felt to improve the surgical margin on the skeletal muscle.  Was recommended patient proceed with radiation therapy and boost to the lumpectomy cavity given the positive surgical margin.  I discussed the general course of treatment side effects and potential toxicities of radiation therapy in this situation with the patient.  She appears to understand and wishes to proceed with planned course of treatment.  In light of the higher boost dose along the rib cage and sternum area,  I discussed with the patient that he she may potentially develop a fracture in this area.  She understands that this higher dose of radiation is needed given the positive surgical margin.  Plan:  Patient is scheduled for CT simulation Monday, May 24 with treatments to begin June 1.  Anticipate  3 weeks of radiation therapy directed to the right breast (hypofractionated accelerated radiation therapy) followed by a boost to the lumpectomy cavity of 8 treatments.   -----------------------------------  Blair Promise, PhD, MD  This document serves as a record of services personally performed by Gery Pray, MD. It was created on his behalf by Clerance Lav, a trained medical scribe. The creation of this record is based on the scribe's personal observations and the provider's statements to them. This document has been checked and approved by the attending provider.

## 2019-06-17 ENCOUNTER — Encounter: Payer: Self-pay | Admitting: *Deleted

## 2019-06-19 NOTE — Progress Notes (Signed)
  Radiation Oncology         [phone] ________________________________  Name: Barbara Green MRN: [id number]  Date: 06/20/2019  DOB: 1940/02/09  SIMULATION AND TREATMENT PLANNING NOTE    ICD-10-CM   1. Malignant neoplasm of upper-inner quadrant of right breast in female, estrogen receptor positive ([place])  C50.211    Z17.0     DIAGNOSIS: StageIA(pT1c, pN0, cM0) RightBreast UIQ,Invasive DuctalCarcinoma, ER+/ PR+/ Her2-, Grade2  NARRATIVE:  The patient was brought to the [facility].  Identity was confirmed.  All relevant records and images related to the planned course of therapy were reviewed.  The patient freely provided informed written consent to proceed with treatment after reviewing the details related to the planned course of therapy. The consent form was witnessed and verified by the simulation staff.  Then, the patient was set-up in a stable reproducible  supine position for radiation therapy.  CT images were obtained.  Surface markings were placed.  The CT images were loaded into the planning software.  Then the target and avoidance structures were contoured.  Treatment planning then occurred.  The radiation prescription was entered and confirmed.  Then, I designed and supervised the construction of a total of 5 medically necessary complex treatment devices.  I have requested : 3D Simulation  I have requested a DVH of the following structures: Heart, lungs, lumpectomy cavity.  I have ordered:dose calc.  PLAN:  The patient will receive 40.05 Gy in 15 fractions followed by a boost to the lumpectomy cavity of 16 Gray in 8 fractions given the grossly positive surgical margins.   Optical Surface Tracking Plan:  Since intensity modulated radiotherapy (IMRT) and 3D conformal radiation treatment methods are predicated on accurate and precise positioning for treatment, intrafraction motion monitoring is medically necessary to ensure accurate and safe treatment  delivery.  The ability to quantify intrafraction motion without excessive ionizing radiation dose can only be performed with optical surface tracking. Accordingly, surface imaging offers the opportunity to obtain 3D measurements of patient position throughout IMRT and 3D treatments without excessive radiation exposure.  I am ordering optical surface tracking for this patient's upcoming course of radiotherapy. ________________________________    Blair Promise, PhD, MD  This document serves as a record of services personally performed by Gery Pray, MD. It was created on his behalf by Clerance Lav, a trained medical scribe. The creation of this record is based on the scribe's personal observations and the provider's statements to them. This document has been checked and approved by the attending provider.

## 2019-06-20 ENCOUNTER — Ambulatory Visit
Admission: RE | Admit: 2019-06-20 | Discharge: 2019-06-20 | Disposition: A | Discharge status: Home / Self Care | Payer: Medicare Other | Source: Ambulatory Visit | Attending: Radiation Oncology | Admitting: Radiation Oncology

## 2019-06-20 ENCOUNTER — Other Ambulatory Visit: Payer: Self-pay

## 2019-06-20 ENCOUNTER — Encounter: Payer: Self-pay | Admitting: *Deleted

## 2019-06-20 DIAGNOSIS — C50211 Malignant neoplasm of upper-inner quadrant of right female breast: Secondary | ICD-10-CM | POA: Diagnosis not present

## 2019-06-20 DIAGNOSIS — Z170 Estrogen receptor positive status [ER+]: Secondary | ICD-10-CM | POA: Insufficient documentation

## 2019-06-21 ENCOUNTER — Telehealth: Payer: Self-pay | Admitting: Hematology

## 2019-06-21 NOTE — Telephone Encounter (Signed)
Scheduled appt per 5/24 sch message-  reminder letter mailed to pt .

## 2019-06-23 DIAGNOSIS — C50211 Malignant neoplasm of upper-inner quadrant of right female breast: Secondary | ICD-10-CM | POA: Diagnosis not present

## 2019-06-29 ENCOUNTER — Other Ambulatory Visit: Payer: Self-pay

## 2019-06-29 ENCOUNTER — Ambulatory Visit
Admission: RE | Admit: 2019-06-29 | Discharge: 2019-06-29 | Disposition: A | Discharge status: Home / Self Care | Payer: Medicare Other | Source: Ambulatory Visit | Attending: Radiation Oncology | Admitting: Radiation Oncology

## 2019-06-29 DIAGNOSIS — Z170 Estrogen receptor positive status [ER+]: Secondary | ICD-10-CM | POA: Diagnosis present

## 2019-06-29 DIAGNOSIS — C50211 Malignant neoplasm of upper-inner quadrant of right female breast: Secondary | ICD-10-CM | POA: Diagnosis present

## 2019-06-29 NOTE — Progress Notes (Signed)
  Radiation Oncology         [phone] ________________________________  Name: Barbara Green MRN: [id number]  Date: 06/29/2019  DOB: [birth year]  Simulation Verification Note    ICD-10-CM   1. Malignant neoplasm of upper-inner quadrant of right breast in female, estrogen receptor positive ([place])  C50.211    Z17.0     NARRATIVE: The patient was brought to the treatment unit and placed in the planned treatment position. The clinical setup was verified. Then port films were obtained and uploaded to the radiation oncology medical record software.  The treatment beams were carefully compared against the planned radiation fields. The position location and shape of the radiation fields was reviewed. They targeted volume of tissue appears to be appropriately covered by the radiation beams. Organs at risk appear to be excluded as planned.  Based on my personal review, I approved the simulation verification. The patient's treatment will proceed as planned.  -----------------------------------  Blair Promise, PhD, MD  This document serves as a record of services personally performed by Gery Pray, MD. It was created on his behalf by Clerance Lav, a trained medical scribe. The creation of this record is based on the scribe's personal observations and the provider's statements to them. This document has been checked and approved by the attending provider.

## 2019-06-30 ENCOUNTER — Other Ambulatory Visit: Payer: Self-pay

## 2019-06-30 ENCOUNTER — Ambulatory Visit
Admission: RE | Admit: 2019-06-30 | Discharge: 2019-06-30 | Disposition: A | Discharge status: Home / Self Care | Payer: Medicare Other | Source: Ambulatory Visit | Attending: Radiation Oncology | Admitting: Radiation Oncology

## 2019-06-30 DIAGNOSIS — C50211 Malignant neoplasm of upper-inner quadrant of right female breast: Secondary | ICD-10-CM | POA: Diagnosis not present

## 2019-07-01 ENCOUNTER — Other Ambulatory Visit: Payer: Self-pay

## 2019-07-01 ENCOUNTER — Ambulatory Visit
Admission: RE | Admit: 2019-07-01 | Discharge: 2019-07-01 | Disposition: A | Discharge status: Home / Self Care | Payer: Medicare Other | Source: Ambulatory Visit | Attending: Radiation Oncology | Admitting: Radiation Oncology

## 2019-07-01 DIAGNOSIS — C50211 Malignant neoplasm of upper-inner quadrant of right female breast: Secondary | ICD-10-CM | POA: Diagnosis not present

## 2019-07-04 ENCOUNTER — Other Ambulatory Visit: Payer: Self-pay

## 2019-07-04 ENCOUNTER — Ambulatory Visit
Admission: RE | Admit: 2019-07-04 | Discharge: 2019-07-04 | Disposition: A | Discharge status: Home / Self Care | Payer: Medicare Other | Source: Ambulatory Visit | Attending: Radiation Oncology | Admitting: Radiation Oncology

## 2019-07-04 DIAGNOSIS — C50211 Malignant neoplasm of upper-inner quadrant of right female breast: Secondary | ICD-10-CM | POA: Diagnosis not present

## 2019-07-05 ENCOUNTER — Ambulatory Visit
Admission: RE | Admit: 2019-07-05 | Discharge: 2019-07-05 | Disposition: A | Discharge status: Home / Self Care | Payer: Medicare Other | Source: Ambulatory Visit | Attending: Radiation Oncology | Admitting: Radiation Oncology

## 2019-07-05 ENCOUNTER — Other Ambulatory Visit: Payer: Self-pay

## 2019-07-05 DIAGNOSIS — C50211 Malignant neoplasm of upper-inner quadrant of right female breast: Secondary | ICD-10-CM | POA: Diagnosis not present

## 2019-07-06 ENCOUNTER — Ambulatory Visit
Admission: RE | Admit: 2019-07-06 | Discharge: 2019-07-06 | Disposition: A | Discharge status: Home / Self Care | Payer: Medicare Other | Source: Ambulatory Visit | Attending: Radiation Oncology | Admitting: Radiation Oncology

## 2019-07-06 ENCOUNTER — Other Ambulatory Visit: Payer: Self-pay

## 2019-07-06 DIAGNOSIS — Z170 Estrogen receptor positive status [ER+]: Secondary | ICD-10-CM

## 2019-07-06 DIAGNOSIS — C50211 Malignant neoplasm of upper-inner quadrant of right female breast: Secondary | ICD-10-CM

## 2019-07-06 MED ORDER — ALRA NON-METALLIC DEODORANT (RAD-ONC)
1.0000 "application " | Freq: Once | TOPICAL | Status: AC
  Administered 2019-07-06: 1 via TOPICAL

## 2019-07-06 MED ORDER — SONAFINE EX EMUL
1.0000 "application " | Freq: Two times a day (BID) | CUTANEOUS | Status: DC
  Administered 2019-07-06: 1 via TOPICAL

## 2019-07-06 NOTE — Addendum Note (Signed)
Encounter addended by: De Burrs, RN on: 07/06/2019 2:48 PM  Actions taken: MAR administration accepted

## 2019-07-07 ENCOUNTER — Ambulatory Visit
Admission: RE | Admit: 2019-07-07 | Discharge: 2019-07-07 | Disposition: A | Discharge status: Home / Self Care | Payer: Medicare Other | Source: Ambulatory Visit | Attending: Radiation Oncology | Admitting: Radiation Oncology

## 2019-07-07 ENCOUNTER — Other Ambulatory Visit: Payer: Self-pay

## 2019-07-07 DIAGNOSIS — C50211 Malignant neoplasm of upper-inner quadrant of right female breast: Secondary | ICD-10-CM | POA: Diagnosis not present

## 2019-07-08 ENCOUNTER — Other Ambulatory Visit: Payer: Self-pay

## 2019-07-08 ENCOUNTER — Ambulatory Visit
Admission: RE | Admit: 2019-07-08 | Discharge: 2019-07-08 | Disposition: A | Discharge status: Home / Self Care | Payer: Medicare Other | Source: Ambulatory Visit | Attending: Radiation Oncology | Admitting: Radiation Oncology

## 2019-07-08 DIAGNOSIS — C50211 Malignant neoplasm of upper-inner quadrant of right female breast: Secondary | ICD-10-CM | POA: Diagnosis not present

## 2019-07-11 ENCOUNTER — Other Ambulatory Visit: Payer: Self-pay

## 2019-07-11 ENCOUNTER — Ambulatory Visit
Admission: RE | Admit: 2019-07-11 | Discharge: 2019-07-11 | Disposition: A | Discharge status: Home / Self Care | Payer: Medicare Other | Source: Ambulatory Visit | Attending: Radiation Oncology | Admitting: Radiation Oncology

## 2019-07-11 DIAGNOSIS — C50211 Malignant neoplasm of upper-inner quadrant of right female breast: Secondary | ICD-10-CM | POA: Diagnosis not present

## 2019-07-12 ENCOUNTER — Ambulatory Visit
Admission: RE | Admit: 2019-07-12 | Discharge: 2019-07-12 | Disposition: A | Discharge status: Home / Self Care | Payer: Medicare Other | Source: Ambulatory Visit | Attending: Radiation Oncology | Admitting: Radiation Oncology

## 2019-07-12 ENCOUNTER — Other Ambulatory Visit: Payer: Self-pay

## 2019-07-12 DIAGNOSIS — C50211 Malignant neoplasm of upper-inner quadrant of right female breast: Secondary | ICD-10-CM | POA: Diagnosis not present

## 2019-07-13 ENCOUNTER — Ambulatory Visit
Admission: RE | Admit: 2019-07-13 | Discharge: 2019-07-13 | Disposition: A | Discharge status: Home / Self Care | Payer: Medicare Other | Source: Ambulatory Visit | Attending: Radiation Oncology | Admitting: Radiation Oncology

## 2019-07-13 ENCOUNTER — Other Ambulatory Visit: Payer: Self-pay

## 2019-07-13 DIAGNOSIS — C50211 Malignant neoplasm of upper-inner quadrant of right female breast: Secondary | ICD-10-CM | POA: Diagnosis not present

## 2019-07-14 ENCOUNTER — Ambulatory Visit
Admission: RE | Admit: 2019-07-14 | Discharge: 2019-07-14 | Disposition: A | Discharge status: Home / Self Care | Payer: Medicare Other | Source: Ambulatory Visit | Attending: Radiation Oncology | Admitting: Radiation Oncology

## 2019-07-14 ENCOUNTER — Other Ambulatory Visit: Payer: Self-pay

## 2019-07-14 DIAGNOSIS — C50211 Malignant neoplasm of upper-inner quadrant of right female breast: Secondary | ICD-10-CM | POA: Diagnosis not present

## 2019-07-15 ENCOUNTER — Other Ambulatory Visit: Payer: Self-pay

## 2019-07-15 ENCOUNTER — Ambulatory Visit
Admission: RE | Admit: 2019-07-15 | Discharge: 2019-07-15 | Disposition: A | Discharge status: Home / Self Care | Payer: Medicare Other | Source: Ambulatory Visit | Attending: Radiation Oncology | Admitting: Radiation Oncology

## 2019-07-15 DIAGNOSIS — C50211 Malignant neoplasm of upper-inner quadrant of right female breast: Secondary | ICD-10-CM | POA: Diagnosis not present

## 2019-07-17 DIAGNOSIS — C50211 Malignant neoplasm of upper-inner quadrant of right female breast: Secondary | ICD-10-CM | POA: Diagnosis not present

## 2019-07-18 ENCOUNTER — Other Ambulatory Visit: Payer: Self-pay

## 2019-07-18 ENCOUNTER — Ambulatory Visit
Admission: RE | Admit: 2019-07-18 | Discharge: 2019-07-18 | Disposition: A | Discharge status: Home / Self Care | Payer: Medicare Other | Source: Ambulatory Visit | Attending: Radiation Oncology | Admitting: Radiation Oncology

## 2019-07-18 DIAGNOSIS — C50211 Malignant neoplasm of upper-inner quadrant of right female breast: Secondary | ICD-10-CM | POA: Diagnosis not present

## 2019-07-19 ENCOUNTER — Ambulatory Visit
Admission: RE | Admit: 2019-07-19 | Discharge: 2019-07-19 | Disposition: A | Discharge status: Home / Self Care | Payer: Medicare Other | Source: Ambulatory Visit | Attending: Radiation Oncology | Admitting: Radiation Oncology

## 2019-07-19 ENCOUNTER — Other Ambulatory Visit: Payer: Self-pay

## 2019-07-19 ENCOUNTER — Ambulatory Visit: Payer: Medicare Other | Admitting: Radiation Oncology

## 2019-07-19 DIAGNOSIS — C50211 Malignant neoplasm of upper-inner quadrant of right female breast: Secondary | ICD-10-CM | POA: Diagnosis not present

## 2019-07-20 ENCOUNTER — Ambulatory Visit
Admission: RE | Admit: 2019-07-20 | Discharge: 2019-07-20 | Disposition: A | Discharge status: Home / Self Care | Payer: Medicare Other | Source: Ambulatory Visit | Attending: Radiation Oncology | Admitting: Radiation Oncology

## 2019-07-20 ENCOUNTER — Telehealth: Payer: Self-pay | Admitting: Hematology

## 2019-07-20 ENCOUNTER — Other Ambulatory Visit: Payer: Self-pay

## 2019-07-20 DIAGNOSIS — C50211 Malignant neoplasm of upper-inner quadrant of right female breast: Secondary | ICD-10-CM | POA: Diagnosis not present

## 2019-07-20 NOTE — Telephone Encounter (Signed)
Called pt per 6/21 sch message - per patient request she will call back to set up an appt with Dr. Burr Medico after radiation is finished.  She did not want to set up an appt at the moment.

## 2019-07-21 ENCOUNTER — Ambulatory Visit: Payer: Medicare Other | Admitting: Internal Medicine

## 2019-07-21 ENCOUNTER — Ambulatory Visit
Admission: RE | Admit: 2019-07-21 | Discharge: 2019-07-21 | Disposition: A | Discharge status: Home / Self Care | Payer: Medicare Other | Source: Ambulatory Visit | Attending: Radiation Oncology | Admitting: Radiation Oncology

## 2019-07-21 ENCOUNTER — Other Ambulatory Visit: Payer: Self-pay

## 2019-07-21 DIAGNOSIS — C50211 Malignant neoplasm of upper-inner quadrant of right female breast: Secondary | ICD-10-CM | POA: Diagnosis not present

## 2019-07-22 ENCOUNTER — Ambulatory Visit
Admission: RE | Admit: 2019-07-22 | Discharge: 2019-07-22 | Disposition: A | Discharge status: Home / Self Care | Payer: Medicare Other | Source: Ambulatory Visit | Attending: Radiation Oncology | Admitting: Radiation Oncology

## 2019-07-22 ENCOUNTER — Other Ambulatory Visit: Payer: Self-pay

## 2019-07-22 DIAGNOSIS — C50211 Malignant neoplasm of upper-inner quadrant of right female breast: Secondary | ICD-10-CM | POA: Diagnosis not present

## 2019-07-25 ENCOUNTER — Ambulatory Visit
Admission: RE | Admit: 2019-07-25 | Discharge: 2019-07-25 | Disposition: A | Discharge status: Home / Self Care | Payer: Medicare Other | Source: Ambulatory Visit | Attending: Radiation Oncology | Admitting: Radiation Oncology

## 2019-07-25 DIAGNOSIS — C50211 Malignant neoplasm of upper-inner quadrant of right female breast: Secondary | ICD-10-CM | POA: Diagnosis not present

## 2019-07-26 ENCOUNTER — Other Ambulatory Visit: Payer: Self-pay

## 2019-07-26 ENCOUNTER — Ambulatory Visit
Admission: RE | Admit: 2019-07-26 | Discharge: 2019-07-26 | Disposition: A | Discharge status: Home / Self Care | Payer: Medicare Other | Source: Ambulatory Visit | Attending: Radiation Oncology | Admitting: Radiation Oncology

## 2019-07-26 DIAGNOSIS — C50211 Malignant neoplasm of upper-inner quadrant of right female breast: Secondary | ICD-10-CM | POA: Diagnosis not present

## 2019-07-27 ENCOUNTER — Inpatient Hospital Stay: Payer: Medicare Other | Admitting: Hematology

## 2019-07-27 ENCOUNTER — Other Ambulatory Visit: Payer: Self-pay

## 2019-07-27 ENCOUNTER — Ambulatory Visit
Admission: RE | Admit: 2019-07-27 | Discharge: 2019-07-27 | Disposition: A | Discharge status: Home / Self Care | Payer: Medicare Other | Source: Ambulatory Visit | Attending: Radiation Oncology | Admitting: Radiation Oncology

## 2019-07-27 DIAGNOSIS — C50211 Malignant neoplasm of upper-inner quadrant of right female breast: Secondary | ICD-10-CM | POA: Diagnosis not present

## 2019-07-28 ENCOUNTER — Other Ambulatory Visit: Payer: Self-pay

## 2019-07-28 ENCOUNTER — Encounter: Payer: Self-pay | Admitting: *Deleted

## 2019-07-28 ENCOUNTER — Ambulatory Visit: Payer: Medicare Other | Admitting: Hematology

## 2019-07-28 ENCOUNTER — Ambulatory Visit
Admission: RE | Admit: 2019-07-28 | Discharge: 2019-07-28 | Disposition: A | Discharge status: Home / Self Care | Payer: Medicare Other | Source: Ambulatory Visit | Attending: Radiation Oncology | Admitting: Radiation Oncology

## 2019-07-28 ENCOUNTER — Telehealth: Payer: Self-pay | Admitting: *Deleted

## 2019-07-28 DIAGNOSIS — C50211 Malignant neoplasm of upper-inner quadrant of right female breast: Secondary | ICD-10-CM | POA: Diagnosis present

## 2019-07-28 DIAGNOSIS — Z170 Estrogen receptor positive status [ER+]: Secondary | ICD-10-CM | POA: Diagnosis present

## 2019-07-28 NOTE — Telephone Encounter (Signed)
Spoke with patient about scheduling a follow up with Dr. Burr Medico.  She does not want to schedule an appointment at this time.  She states she is not going to take AI. She is just getting through xrt and that's it. Stressed the importance of regular follow up but she said she would call back if she changes her mind.

## 2019-07-29 ENCOUNTER — Other Ambulatory Visit: Payer: Self-pay

## 2019-07-29 ENCOUNTER — Ambulatory Visit
Admission: RE | Admit: 2019-07-29 | Discharge: 2019-07-29 | Disposition: A | Discharge status: Home / Self Care | Payer: Medicare Other | Source: Ambulatory Visit | Attending: Radiation Oncology | Admitting: Radiation Oncology

## 2019-07-29 ENCOUNTER — Encounter: Payer: Self-pay | Admitting: Radiation Oncology

## 2019-07-29 DIAGNOSIS — C50211 Malignant neoplasm of upper-inner quadrant of right female breast: Secondary | ICD-10-CM | POA: Diagnosis not present

## 2019-08-10 NOTE — Progress Notes (Incomplete)
  Patient Name: Barbara Green MRN: [id number] DOB: Apr 13, 1940 Referring Physician: Phill Myron (Profile Not Attached) Date of Service: 07/29/2019 [facility], [place]                                                        End Of Treatment Note  Diagnoses: C50.211-Malignant neoplasm of upper-inner quadrant of right female breast  Cancer Staging: StageIA(pT1c, pN0, cM0)RightBreast UIQ,Invasive DuctalCarcinoma, ER+/ PR+/ Her2-, Grade2  Intent: Curative  Radiation Treatment Dates: 06/29/2019 through 07/29/2019 Site Technique Total Dose (Gy) Dose per Fx (Gy) Completed Fx Beam Energies  Breast, Right: Breast_Rt 3D 40.05/40.05 2.67 15/15 6X  Breast, Right: Breast_Rt_Bst 3D 16/16 2 8/8 6X, 10X   Narrative: The patient tolerated radiation therapy relatively well. She did report an intermittent burning sensation on her right chest , under her right breast, and in the right axillary region. She denied fatigue, chest pain, shortness of breath, or trouble with range of motion of right arm. During the first week of treatment, the right breast was noted to have some mild erythema. As treatment continued, it developed into significant radiation dermatitis without skin breakdown. Of note, the patient developed the brisk reaction after using Sonofine and was concerned that she may have an intolerance to it. She was asked to discontinue Sonofine and continue using Hydrocortisone cream twice daily. Although the Hydrocortisone cream helped with the itching, it did not improve her overall skin reaction. Thus, she was advised to obtain some pure aloe vera. Towards the end of treatment, the erythema had improved overall. There was no moist desquamation, nor was there any skin breakdown.  Plan: The patient will follow-up with radiation  oncology in one month.  ________________________________________________   Blair Promise, PhD, MD  This document serves as a record of services personally performed by Gery Pray, MD. It was created on his behalf by Clerance Lav, a trained medical scribe. The creation of this record is based on the scribe's personal observations and the provider's statements to them. This document has been checked and approved by the attending provider.

## 2019-08-12 ENCOUNTER — Ambulatory Visit: Payer: Medicare Other | Admitting: Internal Medicine

## 2019-08-15 ENCOUNTER — Telehealth: Payer: Self-pay

## 2019-08-15 NOTE — Telephone Encounter (Signed)
Pt. returned phone call from staff to confirm tomorrow's virtual appt.with the provider. Pt. discussed with the staff they do not have "any fancy app's" and would be available for just a general phone call from the provider.

## 2019-08-16 ENCOUNTER — Encounter: Payer: Self-pay | Admitting: Internal Medicine

## 2019-08-16 ENCOUNTER — Telehealth (INDEPENDENT_AMBULATORY_CARE_PROVIDER_SITE_OTHER): Payer: Medicare Other | Admitting: Internal Medicine

## 2019-08-16 DIAGNOSIS — I10 Essential (primary) hypertension: Secondary | ICD-10-CM

## 2019-08-16 NOTE — Progress Notes (Signed)
Virtual Visit via Telephone Note  I connected with Barbara Green, on 08/16/2019 at 10:18 AM by telephone due to the COVID-19 pandemic and verified that I am speaking with the correct person using two identifiers.   Consent: I discussed the limitations, risks, security and privacy concerns of performing an evaluation and management service by telephone and the availability of in person appointments. I also discussed with the patient that there may be a patient responsible charge related to this service. The patient expressed understanding and agreed to proceed.   Location of Patient: Home   Location of Provider: Clinic    Persons participating in Telemedicine visit: [facility] Dr. Juleen China    History of Present Illness: Patient has a visit to follow up on chronic medical conditions.   Chronic HTN Disease Monitoring:  Home BP Monitoring - 122/77 yesterday. This is consistent with other values.  Chest pain- no  Dyspnea- no Headache - no  Medications: Amlodipine 5 mg, Losartan 25 mg  Compliance- yes Lightheadedness- no  Edema- no    Past Medical History:  Diagnosis Date   Hypertension    No Known Allergies  Current Outpatient Medications on File Prior to Visit  Medication Sig Dispense Refill   amLODipine (NORVASC) 5 MG tablet Take 1 tablet (5 mg total) by mouth daily. 90 tablet 1   losartan (COZAAR) 25 MG tablet Take 1 tablet (25 mg total) by mouth daily. 90 tablet 1   No current facility-administered medications on file prior to visit.    Observations/Objective: NAD. Speaking clearly.  Work of breathing normal.  Alert and oriented. Mood appropriate.   Assessment and Plan:  1. Essential hypertension BP well controlled despite reduction in Amlodipine dose at last visit due to concern of LE edema. LE edema has now resolved. No red flag symptoms. Continue current regimen. Follow up in 3 months.    Follow Up  Instructions: 3 month f/u HTN    I discussed the assessment and treatment plan with the patient. The patient was provided an opportunity to ask questions and all were answered. The patient agreed with the plan and demonstrated an understanding of the instructions.   The patient was advised to call back or seek an in-person evaluation if the symptoms worsen or if the condition fails to improve as anticipated.     I provided 10 minutes total of non-face-to-face time during this encounter including median intraservice time, reviewing previous notes, investigations, ordering medications, medical decision making, coordinating care and patient verbalized understanding at the end of the visit.    Phill Myron, D.O. Primary [facility] at [facility]  08/16/2019, 10:18 AM

## 2019-09-05 ENCOUNTER — Encounter: Payer: Self-pay | Admitting: Radiation Oncology

## 2019-09-05 ENCOUNTER — Other Ambulatory Visit: Payer: Self-pay

## 2019-09-05 ENCOUNTER — Ambulatory Visit
Admission: RE | Admit: 2019-09-05 | Discharge: 2019-09-05 | Disposition: A | Discharge status: Home / Self Care | Payer: Medicare Other | Source: Ambulatory Visit | Attending: Radiation Oncology | Admitting: Radiation Oncology

## 2019-09-05 DIAGNOSIS — Z79899 Other long term (current) drug therapy: Secondary | ICD-10-CM | POA: Insufficient documentation

## 2019-09-05 DIAGNOSIS — Z923 Personal history of irradiation: Secondary | ICD-10-CM | POA: Diagnosis not present

## 2019-09-05 DIAGNOSIS — Z170 Estrogen receptor positive status [ER+]: Secondary | ICD-10-CM | POA: Diagnosis not present

## 2019-09-05 DIAGNOSIS — C50211 Malignant neoplasm of upper-inner quadrant of right female breast: Secondary | ICD-10-CM | POA: Diagnosis present

## 2019-09-05 NOTE — Progress Notes (Signed)
Radiation Oncology         (336) [phone] ________________________________  Name: Barbara Green MRN: [id number]  Date: 09/05/2019  DOB: [birth year]  Follow-Up Visit Note  CC: Nicolette Bang, DO  [place], [place]   1. Malignant neoplasm of upper-inner quadrant of right breast in female, estrogen receptor positive ([place])  C50.211    Z17.0     Diagnosis: StageIA(pT1c, pN0, cM0)RightBreast UIQ,Invasive DuctalCarcinoma, ER+/ PR+/ Her2-, Grade2  Interval Since Last Radiation: One month and one week.  Radiation Treatment Dates: 06/29/2019 through 07/29/2019 Site Technique Total Dose (Gy) Dose per Fx (Gy) Completed Fx Beam Energies  Breast, Right: Breast_Rt 3D 40.05/40.05 2.67 15/15 6X  Breast, Right: Breast_Rt_Bst 3D 16/16 2 8/8 6X, 10X    Narrative:  The patient returns today for routine follow-up. No significant interval history since the end of treatment.  On review of systems, she reports a pulling sensation in her right breast with increased activity. She has been using aloe vera as needed for her skin. She denies any other symptoms.                            ALLERGIES:  has No Known Allergies.  Meds: Current Outpatient Medications  Medication Sig Dispense Refill   amLODipine (NORVASC) 5 MG tablet Take 1 tablet (5 mg total) by mouth daily. 90 tablet 1   losartan (COZAAR) 25 MG tablet Take 1 tablet (25 mg total) by mouth daily. 90 tablet 1   No current facility-administered medications for this encounter.    Physical Findings: The patient is in no acute distress. Patient is alert and oriented.  height is _0  (1.499 m) and weight is 112 lb 2 oz (50.9 kg). Her oral temperature is 98.2 F (36.8 C). Her blood pressure is 145/74 (abnormal) and her pulse is 115 (abnormal). Her respiration is 18 and oxygen saturation is 99%.  No significant changes. Lungs are clear to auscultation bilaterally. Heart has regular rate and rhythm. No palpable  cervical, supraclavicular, or axillary adenopathy. Abdomen soft, non-tender, normal bowel sounds. Left breast: No palpable mass, nipple discharge, or bleeding.  Right breast: Skin is healed well.  Mild hyperpigmentation changes changes noted as well as mild edema.  Scar is well-healed in the medial aspect of the breast.  No signs of recurrence.  Lab Findings: Lab Results  Component Value Date   WBC 10.4 02/08/2019   HGB 14.8 02/08/2019   HCT 43.8 02/08/2019   MCV 91 02/08/2019   PLT 335 02/08/2019    Radiographic Findings: No results found.  Impression: StageIA(pT1c, pN0, cM0)RightBreast UIQ,Invasive DuctalCarcinoma, ER+/ PR+/ Her2-, Grade2  The patient is recovering from the effects of radiation.  Overall she tolerated her radiation therapy well  Plan: The patient will follow-up with radiation oncology in 3 months.  She will be meeting with medical oncology to discuss adjuvant hormonal therapy.  She however at this point is unsure whether she will proceed with this recommendation.  ____________________________________   Blair Promise, PhD, MD  This document serves as a record of services personally performed by Gery Pray, MD. It was created on his behalf by Clerance Lav, a trained medical scribe. The creation of this record is based on the scribe's personal observations and the provider's statements to them. This document has been checked and approved by the attending provider.

## 2019-09-05 NOTE — Progress Notes (Signed)
Patient here for a 1 month f/u visit with Dr. Sondra Come. Denies pain but does have a pulling sensation in her right breast if she does too much. Uses aloe vera as needed on her skin. Denies any other problems.  BP (!) 145/74 (BP Location: Left Arm, Patient Position: Sitting)   Pulse (!) 115   Temp 98.2 F (36.8 C) (Oral)   Resp 18   Ht 4\' 11"  (1.499 m)   Wt 112 lb 2 oz (50.9 kg)   SpO2 99%   BMI 22.65 kg/m   Wt Readings from Last 3 Encounters:  09/05/19 112 lb 2 oz (50.9 kg)  06/16/19 113 lb 2 oz (51.3 kg)  05/30/19 113 lb 14.4 oz (51.7 kg)

## 2019-09-06 ENCOUNTER — Telehealth: Payer: Self-pay | Admitting: *Deleted

## 2019-09-06 NOTE — Telephone Encounter (Signed)
CALLED PATIENT TO INFORM OF FU APPT. FOR 12-12-19 @ 3 PM WITH DR. KINARD, LVM FOR A RETURN CALL

## 2019-10-19 ENCOUNTER — Ambulatory Visit (INDEPENDENT_AMBULATORY_CARE_PROVIDER_SITE_OTHER): Payer: Medicare Other | Admitting: Family Medicine

## 2019-10-19 ENCOUNTER — Other Ambulatory Visit: Payer: Self-pay

## 2019-10-19 ENCOUNTER — Encounter: Payer: Self-pay | Admitting: Family Medicine

## 2019-10-19 ENCOUNTER — Ambulatory Visit: Payer: Medicare Other | Admitting: Family Medicine

## 2019-10-19 VITALS — BP 130/88 | HR 104 | Temp 97.2°F | Resp 17 | Wt 111.0 lb

## 2019-10-19 DIAGNOSIS — R7309 Other abnormal glucose: Secondary | ICD-10-CM

## 2019-10-19 DIAGNOSIS — R42 Dizziness and giddiness: Secondary | ICD-10-CM | POA: Diagnosis not present

## 2019-10-19 DIAGNOSIS — I10 Essential (primary) hypertension: Secondary | ICD-10-CM | POA: Diagnosis not present

## 2019-10-19 NOTE — Progress Notes (Signed)
Patient ID: Barbara Green, female    DOB: November 15, 1940, 79 y.o.   MRN: [id number]  PCP: Nicolette Bang, DO  Chief Complaint  Patient presents with  . Hypertension    Subjective:  HPI Barbara Green is a 79 y.o. female presents for evaluation of hypertension follow-up. Patient reports home readings of blood pressure has been less than 130/80.  Her husband's caregiver checks her blood pressure at least 3 times per week and readings are always well controlled.  However patient is concerned as she is having severe profound.  Said dizziness almost to the point of losing her balance.  She denies falling however reports that this dizziness is becoming unbearable as she has to be primary caretaker for her husband when her in-home health is not available.  She reports taking both of her blood pressure medicines first thing in the morning and throughout the day dizziness worsens.  She is not having any headaches, shortness of breath, or chest pain.  Dizziness is  a new symptom since starting her blood pressure medicine.   Patient also reports successfully completing all recent breast cancer treatments.  She has not had any recent blood work completed since completing both chemotherapy and radiation.  She denies any additional concerns today.  Social History   Socioeconomic History  . Marital status: Married    Spouse name: Not on file  . Number of children: 3  . Years of education: Not on file  . Highest education level: 12th grade  Occupational History  . Not on file  Tobacco Use  . Smoking status: Current Every Day Smoker    Packs/day: 0.33    Years: 30.00    Pack years: 9.90  . Smokeless tobacco: Never Used  Vaping Use  . Vaping Use: Never used  Substance and Sexual Activity  . Alcohol use: Yes    Comment: occasionally a glass wine  . Drug use: Never  . Sexual activity: Not on file  Other Topics Concern  . Not on file  Social History Narrative  . Not on file   Social  Determinants of Health   Financial Resource Strain:   . Difficulty of Paying Living Expenses: Not on file  Food Insecurity:   . Worried About Charity fundraiser in the Last Year: Not on file  . Ran Out of Food in the Last Year: Not on file  Transportation Needs:   . Lack of Transportation (Medical): Not on file  . Lack of Transportation (Non-Medical): Not on file  Physical Activity:   . Days of Exercise per Week: Not on file  . Minutes of Exercise per Session: Not on file  Stress:   . Feeling of Stress : Not on file  Social Connections:   . Frequency of Communication with Friends and Family: Not on file  . Frequency of Social Gatherings with Friends and Family: Not on file  . Attends Religious Services: Not on file  . Active Member of Clubs or Organizations: Not on file  . Attends Archivist Meetings: Not on file  . Marital Status: Not on file  Intimate Partner Violence:   . Fear of Current or Ex-Partner: Not on file  . Emotionally Abused: Not on file  . Physically Abused: Not on file  . Sexually Abused: Not on file    Family History  Problem Relation Age of Onset  . Alzheimer's disease Mother   . Heart disease Father   . Alcohol abuse Father  Review of Systems Pertinent negatives listed in HPI  Patient Active Problem List   Diagnosis Date Noted  . Malignant neoplasm of upper-inner quadrant of right breast in female, estrogen receptor positive ([place]) 04/20/2019  . Tobacco dependence 02/08/2019  . Unspecified lump in the right breast, upper inner quadrant  02/08/2019  . Essential hypertension 02/08/2019  . Tachycardia 02/08/2019    No Known Allergies  Prior to Admission medications   Medication Sig Start Date End Date Taking? Authorizing Provider  amLODipine (NORVASC) 5 MG tablet Take 1 tablet (5 mg total) by mouth daily. 05/13/19  Yes Nicolette Bang, DO  losartan (COZAAR) 25 MG tablet Take 1 tablet (25 mg total) by mouth daily. 04/25/19  Yes  Nicolette Bang, DO    Past Medical, Surgical Family and Social History reviewed and updated.    Objective:   Today's Vitals   10/19/19 1410  BP: (!) 150/84  Pulse: (!) 104  Resp: 17  Temp: (!) 97.2 F (36.2 C)  TempSrc: Temporal  SpO2: 95%  Weight: 111 lb (50.3 kg)    Wt Readings from Last 3 Encounters:  10/19/19 111 lb (50.3 kg)  09/05/19 112 lb 2 oz (50.9 kg)  06/16/19 113 lb 2 oz (51.3 kg)   Physical Exam General appearance: alert, well developed, well nourished, appears stated age Head: Normocephalic, without obvious abnormality, atraumatic Respiratory: Respirations even and unlabored, normal respiratory rate Heart: Tachycardia, normal rhythml. No gallop or murmurs noted on exam  Extremities: No gross deformities Skin: Skin color, texture, turgor normal. No rashes seen  Neurological: GCS 15, CN intact, normal gait, normal coordination   Psych: Appropriate mood and affect.  Assessment & Plan:  1. Essential hypertension -BP not at goal today however discussed in depth with patient I suspect her dizziness that she is experiencing is related to taking both of her blood pressure medicines first thing in the morning together.  Patient is of a small stature and likely fluctuations in blood pressure could easily precipitate dizziness.  We discussed intact taking blood pressure medicines at spaced intervals of for instance we discussed starting amlodipine at bedtime and losartan in the morning after breakfast.  Advised to continue to monitor blood pressure.  I will have our nurse here follow-up with me in 2 weeks to see if dizziness has resolved taking blood pressure medications at 12-hour intervals. Pending labs: - Comprehensive metabolic panel - TSH  2. Elevated glucose level, per prior CMP  - Hemoglobin A1c  3. Dizziness See plan #1   -The patient was given clear instructions to go to ER or return to medical center if symptoms do not improve, worsen or new  problems develop. The patient verbalized understanding.    Follow-up 6 months BP check     Molli Barrows, FNP-C Primary Care at [facility] [address], [place] [phone]: [phone]

## 2019-10-19 NOTE — Patient Instructions (Signed)
Dizziness is likely caused by rapidly lowering blood pressure by taking all medications at once. As discussed, take Losartan 25 mg in the morning and amlodipine 5 mg at bedtime.   Orthostatic Hypotension Blood pressure is a measurement of how strongly, or weakly, your blood is pressing against the walls of your arteries. Orthostatic hypotension is a sudden drop in blood pressure that happens when you quickly change positions, such as when you get up from sitting or lying down. Arteries are blood vessels that carry blood from your heart throughout your body. When blood pressure is too low, you may not get enough blood to your brain or to the rest of your organs. This can cause weakness, light-headedness, rapid heartbeat, and fainting. This can last for just a few seconds or for up to a few minutes. Orthostatic hypotension is usually not a serious problem. However, if it happens frequently or gets worse, it may be a sign of something more serious. What are the causes? This condition may be caused by:  Sudden changes in posture, such as standing up quickly after you have been sitting or lying down.  Blood loss.  Loss of body fluids (dehydration).  Heart problems.  Hormone (endocrine) problems.  Pregnancy.  Severe infection.  Lack of certain nutrients.  Severe allergic reactions (anaphylaxis).  Certain medicines, such as blood pressure medicine or medicines that make the body lose excess fluids (diuretics). Sometimes, this condition can be caused by not taking medicine as directed, such as taking too much of a certain medicine. What increases the risk? The following factors may make you more likely to develop this condition:  Age. Risk increases as you get older.  Conditions that affect the heart or the central nervous system.  Taking certain medicines, such as blood pressure medicine or diuretics.  Being pregnant. What are the signs or symptoms? Symptoms of this condition may  include:  Weakness.  Light-headedness.  Dizziness.  Blurred vision.  Fatigue.  Rapid heartbeat.  Fainting, in severe cases. How is this diagnosed? This condition is diagnosed based on:  Your medical history.  Your symptoms.  Your blood pressure measurement. Your health care provider will check your blood pressure when you are: ? Lying down. ? Sitting. ? Standing. A blood pressure reading is recorded as two numbers, such as "120 over 80" (or 120/80). The first ("top") number is called the systolic pressure. It is a measure of the pressure in your arteries as your heart beats. The second ("bottom") number is called the diastolic pressure. It is a measure of the pressure in your arteries when your heart relaxes between beats. Blood pressure is measured in a unit called mm Hg. Healthy blood pressure for most adults is 120/80. If your blood pressure is below 90/60, you may be diagnosed with hypotension. Other information or tests that may be used to diagnose orthostatic hypotension include:  Your other vital signs, such as your heart rate and temperature.  Blood tests.  Tilt table test. For this test, you will be safely secured to a table that moves you from a lying position to an upright position. Your heart rhythm and blood pressure will be monitored during the test. How is this treated? This condition may be treated by:  Changing your diet. This may involve eating more salt (sodium) or drinking more water.  Taking medicines to raise your blood pressure.  Changing the dosage of certain medicines you are taking that might be lowering your blood pressure.  Wearing compression stockings.  These stockings help to prevent blood clots and reduce swelling in your legs. In some cases, you may need to go to the hospital for:  Fluid replacement. This means you will receive fluids through an IV.  Blood replacement. This means you will receive donated blood through an IV  (transfusion).  Treating an infection or heart problems, if this applies.  Monitoring. You may need to be monitored while medicines that you are taking wear off. Follow these instructions at home: Eating and drinking   Drink enough fluid to keep your urine pale yellow.  Eat a healthy diet, and follow instructions from your health care provider about eating or drinking restrictions. A healthy diet includes: ? Fresh fruits and vegetables. ? Whole grains. ? Lean meats. ? Low-fat dairy products.  Eat extra salt only as directed. Do not add extra salt to your diet unless your health care provider told you to do that.  Eat frequent, small meals.  Avoid standing up suddenly after eating. Medicines  Take over-the-counter and prescription medicines only as told by your health care provider. ? Follow instructions from your health care provider about changing the dosage of your current medicines, if this applies. ? Do not stop or adjust any of your medicines on your own. General instructions   Wear compression stockings as told by your health care provider.  Get up slowly from lying down or sitting positions. This gives your blood pressure a chance to adjust.  Avoid hot showers and excessive heat as directed by your health care provider.  Return to your normal activities as told by your health care provider. Ask your health care provider what activities are safe for you.  Do not use any products that contain nicotine or tobacco, such as cigarettes, e-cigarettes, and chewing tobacco. If you need help quitting, ask your health care provider.  Keep all follow-up visits as told by your health care provider. This is important. Contact a health care provider if you:  Vomit.  Have diarrhea.  Have a fever for more than 2-3 days.  Feel more thirsty than usual.  Feel weak and tired. Get help right away if you:  Have chest pain.  Have a fast or irregular heartbeat.  Develop  numbness in any part of your body.  Cannot move your arms or your legs.  Have trouble speaking.  Become sweaty or feel light-headed.  Faint.  Feel short of breath.  Have trouble staying awake.  Feel confused. Summary  Orthostatic hypotension is a sudden drop in blood pressure that happens when you quickly change positions.  Orthostatic hypotension is usually not a serious problem.  It is diagnosed by having your blood pressure taken lying down, sitting, and then standing.  It may be treated by changing your diet or adjusting your medicines. This information is not intended to replace advice given to you by your health care provider. Make sure you discuss any questions you have with your health care provider. Document Revised: 07/09/2017 Document Reviewed: 07/09/2017 Elsevier Patient Education  [place].

## 2019-10-20 LAB — COMPREHENSIVE METABOLIC PANEL
ALT: 16 IU/L (ref 0–32)
AST: 21 IU/L (ref 0–40)
Albumin/Globulin Ratio: 2.3 — ABNORMAL HIGH (ref 1.2–2.2)
Albumin: 4.8 g/dL — ABNORMAL HIGH (ref 3.7–4.7)
Alkaline Phosphatase: 104 IU/L (ref 44–121)
BUN/Creatinine Ratio: 12 (ref 12–28)
BUN: 6 mg/dL — ABNORMAL LOW (ref 8–27)
Bilirubin Total: 0.5 mg/dL (ref 0.0–1.2)
CO2: 22 mmol/L (ref 20–29)
Calcium: 10 mg/dL (ref 8.7–10.3)
Chloride: 97 mmol/L (ref 96–106)
Creatinine, Ser: 0.5 mg/dL — ABNORMAL LOW (ref 0.57–1.00)
GFR calc Af Amer: 107 mL/min/{1.73_m2} (ref >59)
GFR calc non Af Amer: 93 mL/min/{1.73_m2} (ref >59)
Globulin, Total: 2.1 g/dL (ref 1.5–4.5)
Glucose: 89 mg/dL (ref 65–99)
Potassium: 5.7 mmol/L — ABNORMAL HIGH (ref 3.5–5.2)
Sodium: 136 mmol/L (ref 134–144)
Total Protein: 6.9 g/dL (ref 6.0–8.5)

## 2019-10-20 LAB — TSH: TSH: 2 u[IU]/mL (ref 0.450–4.500)

## 2019-10-20 LAB — HEMOGLOBIN A1C
Est. average glucose Bld gHb Est-mCnc: 108 mg/dL
Hgb A1c MFr Bld: 5.4 % (ref 4.8–5.6)

## 2019-11-04 ENCOUNTER — Telehealth: Payer: Self-pay

## 2019-11-04 NOTE — Telephone Encounter (Signed)
Called patient to follow up on BP readings & dizziness since last visit. Covering provider had advised her to take Losartan in the AM & Amlodipine in the PM. Patient states that BP readings have still been in the normal range when her husband's aide comes to the house but she hasn't noticed an improvement in the dizziness. I did offer her an appointment with you on Tuesday but her husband's doctor is doing a house call.  We also went over her labs & she is aware that she is no longer supposed to be taking the Losartan.  Please advise.

## 2019-11-04 NOTE — Progress Notes (Signed)
Patient notified of results & recommendations. Expressed understanding.

## 2019-11-07 NOTE — Telephone Encounter (Signed)
I doubt the dizziness is related to BP fluctuations especially if it persisted despite separating the medications. Was she aware to d/c Losartan a few weeks ago when labs resulted or just during your recent phone call?   I would recommend a follow up visit for her dizziness. Another option would be to refer to vestibular rehab for evaluation.   Phill Myron, D.O. Primary Care at [facility]  11/07/2019, 9:40 AM

## 2019-11-08 NOTE — Telephone Encounter (Signed)
Spoke with patient. She states that she will continue not taking the Losartan. She had been off of it since her visit because the provider wanted to see her kidney function.  She would like to hold off on scheduling an appointment with PCP or being referred. States that she would like me to call her back in 1 week to follow up on dizziness.

## 2019-11-08 NOTE — Telephone Encounter (Signed)
Left voice mail to call back 

## 2019-11-14 ENCOUNTER — Other Ambulatory Visit: Payer: Self-pay

## 2019-11-14 DIAGNOSIS — I10 Essential (primary) hypertension: Secondary | ICD-10-CM

## 2019-11-14 MED ORDER — AMLODIPINE BESYLATE 5 MG PO TABS: 5.0000 mg | ORAL_TABLET | Freq: Every day | ORAL | 1 refills | Status: DC

## 2019-11-16 ENCOUNTER — Ambulatory Visit: Payer: Medicare Other | Admitting: Internal Medicine

## 2019-12-12 ENCOUNTER — Other Ambulatory Visit: Payer: Self-pay

## 2019-12-12 ENCOUNTER — Encounter: Payer: Self-pay | Admitting: Radiation Oncology

## 2019-12-12 ENCOUNTER — Ambulatory Visit
Admission: RE | Admit: 2019-12-12 | Discharge: 2019-12-12 | Disposition: A | Discharge status: Home / Self Care | Payer: Medicare Other | Source: Ambulatory Visit | Attending: Radiation Oncology | Admitting: Radiation Oncology

## 2019-12-12 DIAGNOSIS — Z923 Personal history of irradiation: Secondary | ICD-10-CM | POA: Diagnosis not present

## 2019-12-12 DIAGNOSIS — Z170 Estrogen receptor positive status [ER+]: Secondary | ICD-10-CM

## 2019-12-12 DIAGNOSIS — Z853 Personal history of malignant neoplasm of breast: Secondary | ICD-10-CM | POA: Diagnosis present

## 2019-12-12 DIAGNOSIS — C50211 Malignant neoplasm of upper-inner quadrant of right female breast: Secondary | ICD-10-CM

## 2019-12-12 NOTE — Progress Notes (Signed)
Patient here for a 3 month f/u visit. She denies pain and denies skin breakdown in her right breast.  BP (!) 155/82 (BP Location: Left Arm, Patient Position: Sitting, Cuff Size: Normal)   Pulse 97   Temp 97.9 F (36.6 C)   Resp 18   Ht 4\' 11"  (1.499 m)   Wt 112 lb 12.8 oz (51.2 kg)   SpO2 93%   BMI 22.78 kg/m   Wt Readings from Last 3 Encounters:  12/12/19 112 lb 12.8 oz (51.2 kg)  10/19/19 111 lb (50.3 kg)  09/05/19 112 lb 2 oz (50.9 kg)

## 2019-12-12 NOTE — Progress Notes (Signed)
  Radiation Oncology         [phone] ________________________________  Name: Barbara Green MRN: [id number]  Date: 12/12/2019  DOB: 1940-02-28  Follow-Up Visit Note  CC: Nicolette Bang, DO  Caryl Never*    ICD-10-CM   1. Malignant neoplasm of upper-inner quadrant of right breast in female, estrogen receptor positive ([place])  C50.211    Z17.0     Diagnosis: StageIA(pT1c, pN0, cM0)RightBreast UIQ,Invasive DuctalCarcinoma, ER+/ PR+/ Her2-, Grade2  Interval Since Last Radiation: Four months, one week, and six days  Radiation Treatment Dates: 06/29/2019 through 07/29/2019 Site Technique Total Dose (Gy) Dose per Fx (Gy) Completed Fx Beam Energies  Breast, Right: Breast_Rt 3D 40.05/40.05 2.67 15/15 6X  Breast, Right: Breast_Rt_Bst 3D 16/16 2 8/8 6X, 10X    Narrative:  The patient returns today for routine follow-up. No significant interval history since her last visit.  On review of systems, she reports feeling well.. She denies she denies any pain along the sternum or right breast area denies any swelling in her right arm or hand..                       ALLERGIES:  has No Known Allergies.  Meds: No current outpatient medications on file.   No current facility-administered medications for this encounter.    Physical Findings: The patient is in no acute distress. Patient is alert and oriented.  height is $RemoveB'4\' 11"'YajuwqbO$  (1.499 m) and weight is 112 lb 12.8 oz (51.2 kg). Her temperature is 97.9 F (36.6 C). Her blood pressure is 155/82 (abnormal) and her pulse is 97. Her respiration is 18 and oxygen saturation is 93%.  No significant changes. Lungs are clear to auscultation bilaterally. Heart has regular rate and rhythm. No palpable cervical, supraclavicular, or axillary adenopathy. Abdomen soft, non-tender, normal bowel sounds. Left breast: No palpable mass, nipple discharge, or bleeding.  Right breast: Breast is well-healed.  Scar in the medial aspect of the  breast that shows some induration and hyperpigmentation changes consistent with radiation effect.  No obvious signs of recurrence  Lab Findings: Lab Results  Component Value Date   WBC 10.4 02/08/2019   HGB 14.8 02/08/2019   HCT 43.8 02/08/2019   MCV 91 02/08/2019   PLT 335 02/08/2019    Radiographic Findings: No results found.  Impression: StageIA(pT1c, pN0, cM0)RightBreast UIQ,Invasive DuctalCarcinoma, ER+/ PR+/ Her2-, Grade2  No evidence of recurrence on clinical exam today.  Patient elected not to take an aromatase inhibitor.  Plan: The patient will follow-up with radiation oncology in 6 months.  She will also see Dr. Ninfa Linden the summer of this year.  She will be due for bilateral mammograms in March 2022  Total time spent in this encounter was 20 minutes which included reviewing the patient's most recent interval history, physical examination, and documentation.  ____________________________________   Blair Promise, PhD, MD  This document serves as a record of services personally performed by Gery Pray, MD. It was created on his behalf by Clerance Lav, a trained medical scribe. The creation of this record is based on the scribe's personal observations and the provider's statements to them. This document has been checked and approved by the attending provider.

## 2020-04-18 ENCOUNTER — Telehealth: Payer: Medicare Other

## 2020-04-30 ENCOUNTER — Ambulatory Visit (INDEPENDENT_AMBULATORY_CARE_PROVIDER_SITE_OTHER): Payer: Medicare Other | Admitting: Internal Medicine

## 2020-04-30 ENCOUNTER — Other Ambulatory Visit: Payer: Self-pay

## 2020-04-30 ENCOUNTER — Encounter: Payer: Self-pay | Admitting: Internal Medicine

## 2020-04-30 VITALS — BP 146/88 | HR 106 | Temp 97.3°F | Resp 16 | Wt 115.0 lb

## 2020-04-30 DIAGNOSIS — I10 Essential (primary) hypertension: Secondary | ICD-10-CM

## 2020-04-30 DIAGNOSIS — F17200 Nicotine dependence, unspecified, uncomplicated: Secondary | ICD-10-CM

## 2020-04-30 DIAGNOSIS — F17210 Nicotine dependence, cigarettes, uncomplicated: Secondary | ICD-10-CM

## 2020-04-30 MED ORDER — NICOTINE 7 MG/24HR TD PT24: 7.0000 mg | MEDICATED_PATCH | Freq: Every day | TRANSDERMAL | 1 refills | Status: DC

## 2020-04-30 MED ORDER — HYDROCHLOROTHIAZIDE 12.5 MG PO CAPS: 12.5000 mg | ORAL_CAPSULE | Freq: Every day | ORAL | 1 refills | Status: DC

## 2020-04-30 NOTE — Progress Notes (Signed)
Follow HTN Was taking amlodipine and losartan but felt dizzy  Stressful days after husband passed away in 26-Jan-2023.

## 2020-04-30 NOTE — Progress Notes (Signed)
Subjective:    Barbara Green - 80 y.o. female MRN [id number]  Date of birth: [birth year]  HPI  Barbara Green is here for follow up on HTN. Patient was previously taking Losartan 50 mg and Amlodipine 10 mg but reported dizziness with HTN regimen. She was seen 9/22 for this complaint. Was advised at that time to take one medication at night and one in the morning. Subsequently, her CMET was monitored and K was elevated. She was instructed to hold Losartan and have the K rechecked. Does not appear this was ever monitored again.   154/86 with HR 85 this AM. When her husband was alive, his caregiver would take her BP about 3x per week. Her husband has since passed away. Patient continues to monitor at home and reports typical numbers are around 150/80s. Bottom number is never as high as it is in the office today.   Reports her dizziness resolved as soon as she stopped her BP medications.    Health Maintenance:  Health Maintenance Due  Topic Date Due  . Hepatitis C Screening  Never done  . TETANUS/TDAP  Never done  . DEXA SCAN  Never done  . PNA vac Low Risk Adult (1 of 2 - PCV13) Never done    -  reports that she has been smoking. She has a 9.90 pack-year smoking history. She has never used smokeless tobacco. - Review of Systems: Per HPI. - Past Medical History: Patient Active Problem List   Diagnosis Date Noted  . Malignant neoplasm of upper-inner quadrant of right breast in female, estrogen receptor positive ([place]) 04/20/2019  . Tobacco dependence 02/08/2019  . Unspecified lump in the right breast, upper inner quadrant  02/08/2019  . Essential hypertension 02/08/2019  . Tachycardia 02/08/2019   - Medications: reviewed and updated   Objective:   Physical Exam BP (!) 146/88 (BP Location: Right Arm, Patient Position: Sitting, Cuff Size: Normal)   Pulse (!) 106   Temp (!) 97.3 F (36.3 C)   Resp 16   Wt 115 lb (52.2 kg)   SpO2 96%   BMI 23.23 kg/m  Physical  Exam Constitutional:      General: She is not in acute distress.    Appearance: She is not diaphoretic.  HENT:     Head: Normocephalic and atraumatic.  Eyes:     Conjunctiva/sclera: Conjunctivae normal.  Cardiovascular:     Rate and Rhythm: Normal rate and regular rhythm.     Heart sounds: Normal heart sounds. No murmur heard.   Pulmonary:     Effort: Pulmonary effort is normal. No respiratory distress.     Breath sounds: Normal breath sounds.  Musculoskeletal:        General: Normal range of motion.  Skin:    General: Skin is warm and dry.  Neurological:     Mental Status: She is alert and oriented to person, place, and time.  Psychiatric:        Mood and Affect: Affect normal.        Judgment: Judgment normal.            Assessment & Plan:   1. Essential hypertension Patient BP remains above goal. Dizziness resolved once discontinued other BP medications. Will start low dose HCTZ and monitor. Asymptomatic.  - Basic Metabolic Panel - hydrochlorothiazide (MICROZIDE) 12.5 MG capsule; Take 1 capsule (12.5 mg total) by mouth daily.  Dispense: 90 capsule; Refill: 1  2. Tobacco dependence Patient currently smokes about 1/2 ppd. Spent  5 minutes counseling on dangers of tobacco use and benefits of quitting, offered pharmacological intervention to aid quitting and patient is ready to quit. Therapy started: nicotine patch. Discussed that may need higher dose of nicotine patch as sometimes smokes closer to 1 ppd. Also discussed that nicotine patch can elevated BP and will need to monitor this closely.  - nicotine (NICODERM CQ - DOSED IN MG/24 HR) 7 mg/24hr patch; Place 1 patch (7 mg total) onto the skin daily.  Dispense: 28 patch; Refill: [place], D.O. 05/02/2020, 11:40 AM Primary Care at [facility]

## 2020-05-03 LAB — BASIC METABOLIC PANEL
BUN/Creatinine Ratio: 16 (ref 12–28)
BUN: 7 mg/dL — ABNORMAL LOW (ref 8–27)
CO2: 19 mmol/L — ABNORMAL LOW (ref 20–29)
Calcium: 9.5 mg/dL (ref 8.7–10.3)
Chloride: 95 mmol/L — ABNORMAL LOW (ref 96–106)
Creatinine, Ser: 0.45 mg/dL — ABNORMAL LOW (ref 0.57–1.00)
Glucose: 77 mg/dL (ref 65–99)
Potassium: 4.1 mmol/L (ref 3.5–5.2)
Sodium: 133 mmol/L — ABNORMAL LOW (ref 134–144)
eGFR: 98 mL/min/{1.73_m2} (ref >59)

## 2020-05-03 LAB — SPECIMEN STATUS REPORT

## 2020-05-06 IMAGING — US US BREAST*R* LIMITED INC AXILLA
1 series · 13 of 14 positions shown · non-contrast
Comparison: Baseline exam

CLINICAL DATA: Palpable mass in the UPPER INNER QUADRANT of the
RIGHT breast for 2 or 3 months. Patient has seen Dr. Nomasibulele for
consultation.

EXAM:
DIGITAL DIAGNOSTIC BILATERAL MAMMOGRAM WITH CAD AND TOMO
ULTRASOUND RIGHT BREAST

[Series 1: us breast*right* limited inc axilla · 0.06mm/px · 13 of 14 slices shown]
[im 1/14]
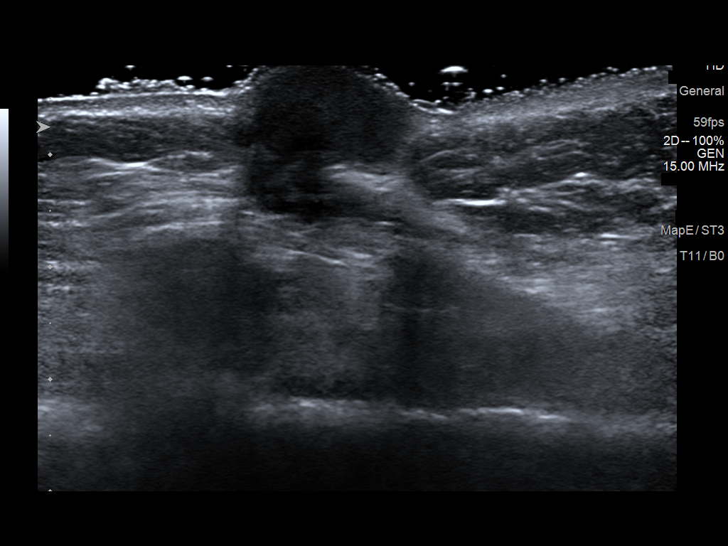
[im 2/14]
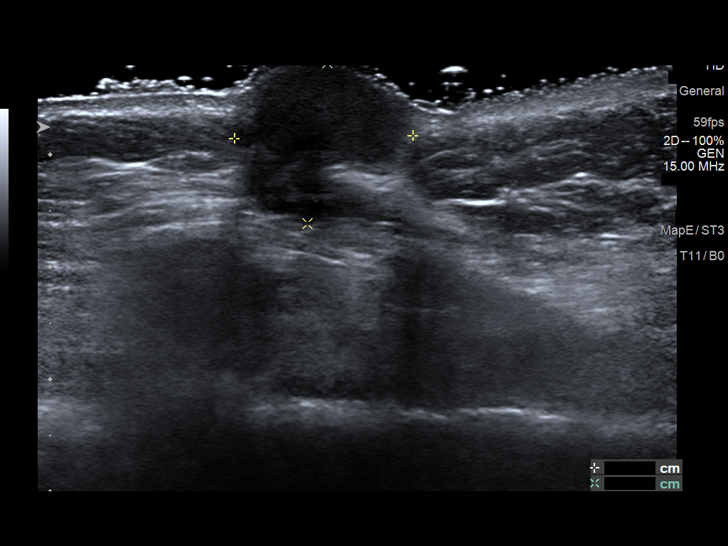
[im 3/14]
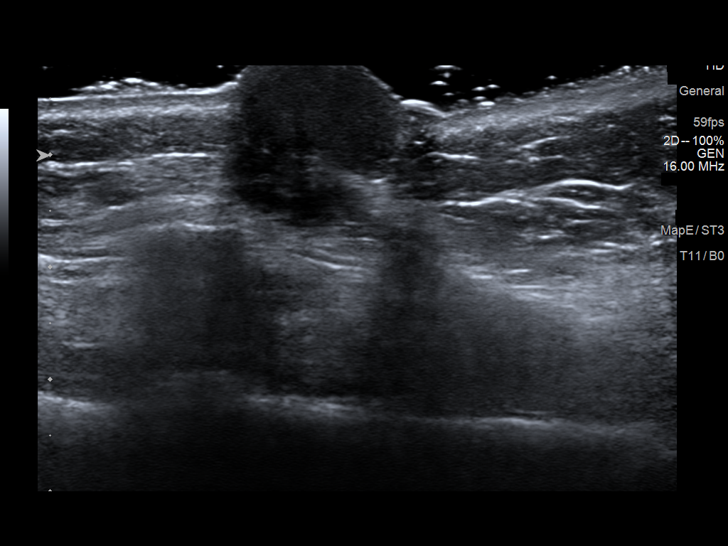
[im 4/14]
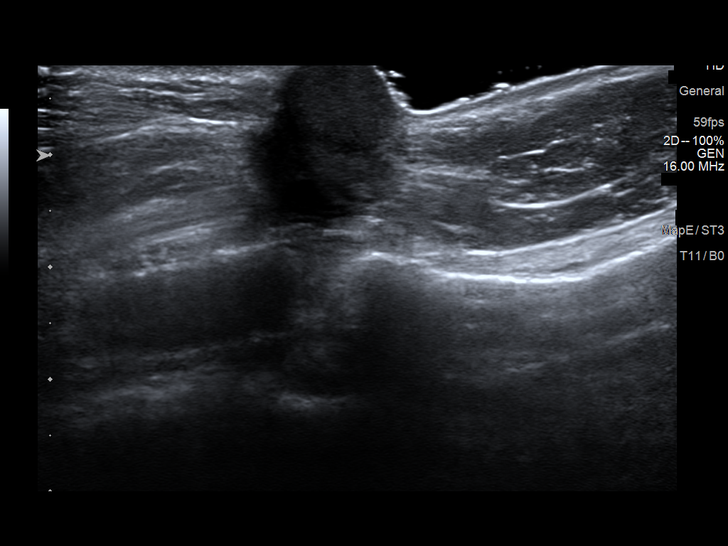
[im 5/14]
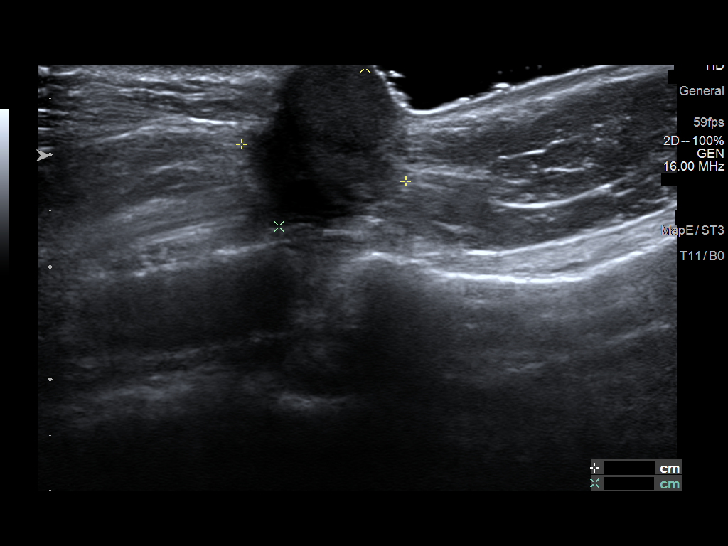
[im 6/14]
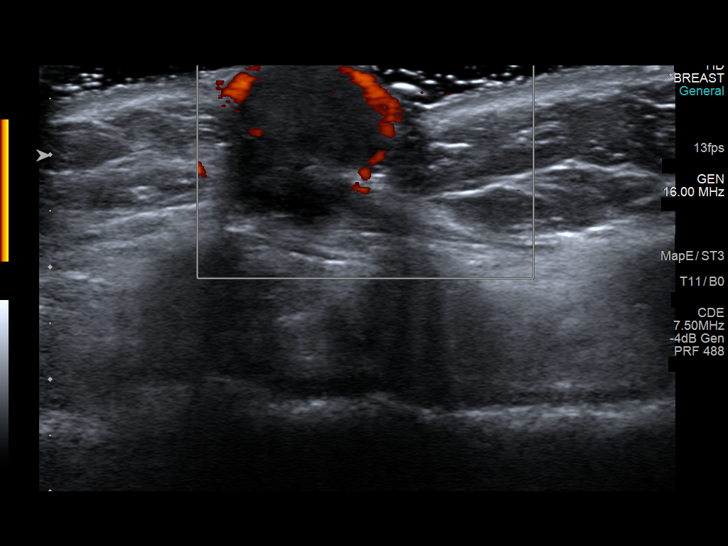
[im 8/14]
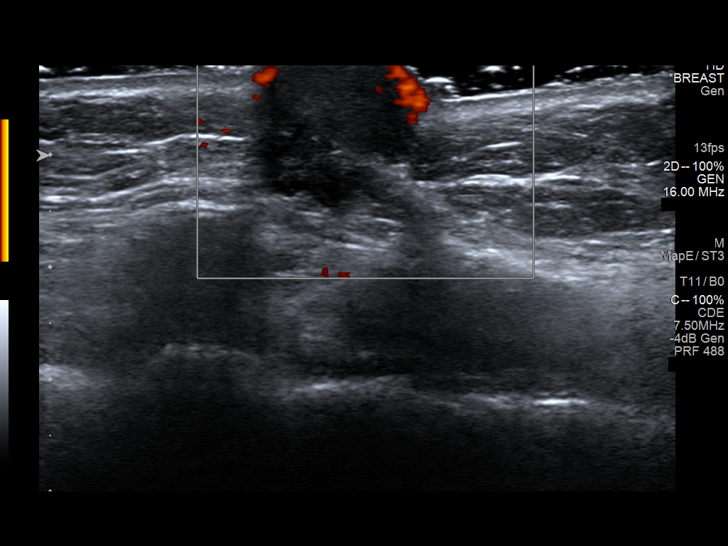
[im 9/14]
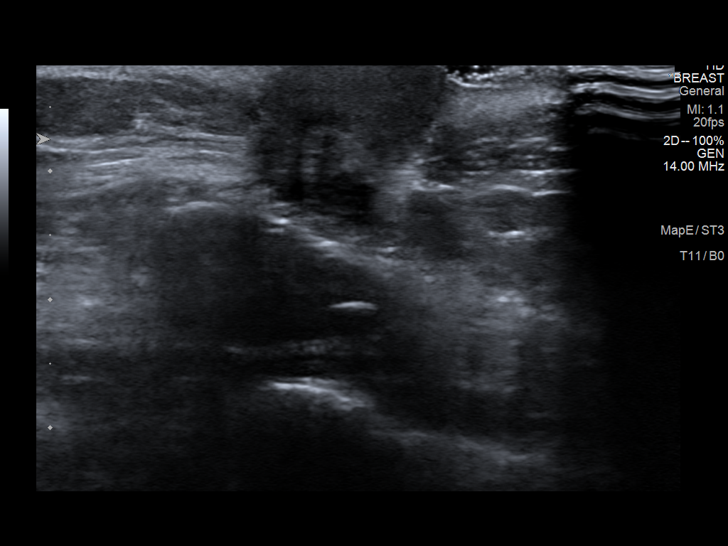
[im 10/14]
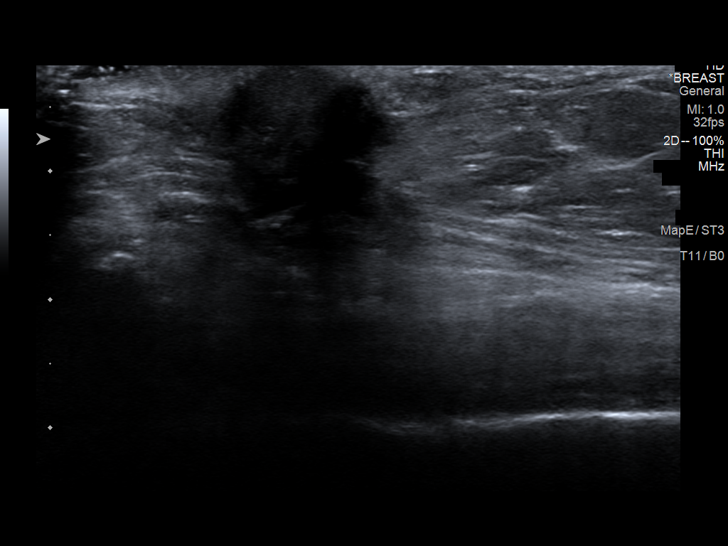
[im 11/14]
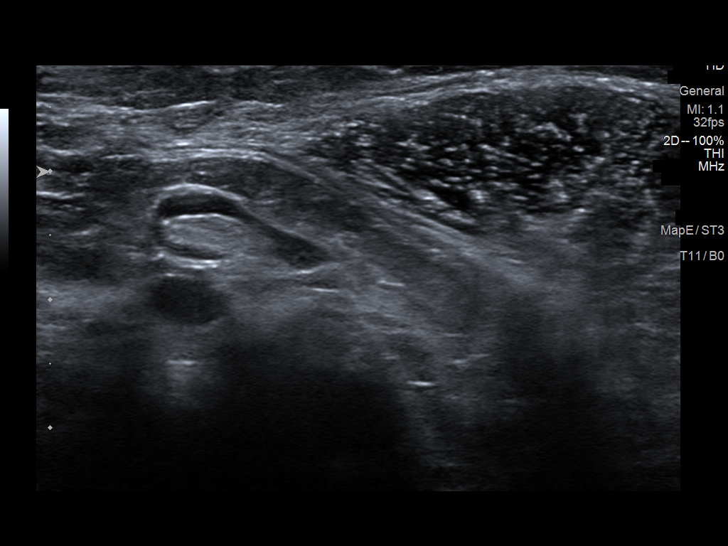
[im 12/14]
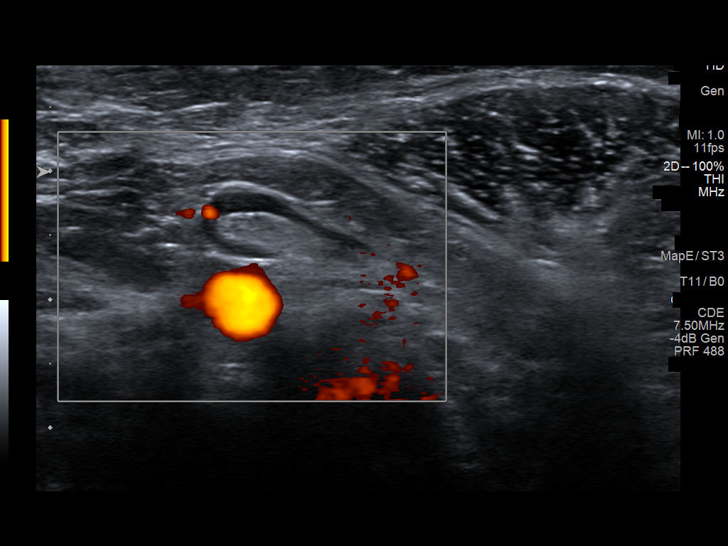
[im 13/14]
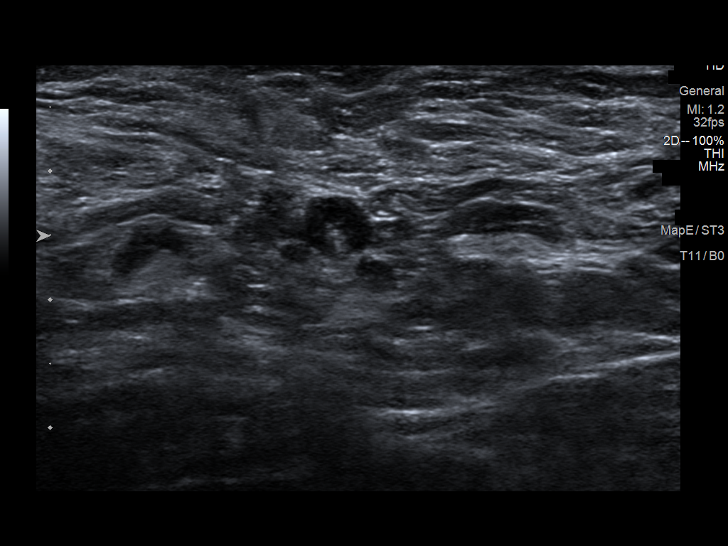
[im 14/14]
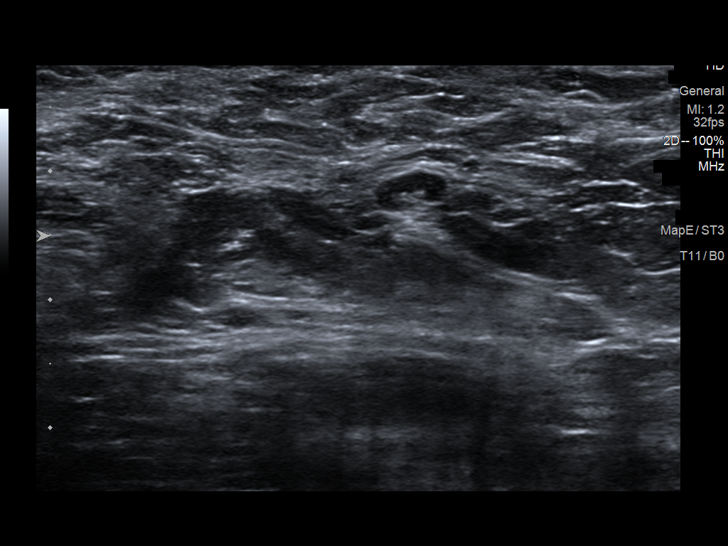

[13 of 14 positions shown; findings below may reference images not displayed]

ACR Breast Density Category c: The breast tissue is heterogeneously
dense, which may obscure small masses.
FINDINGS: No suspicious mass, distortion, or microcalcifications are
identified to suggest presence of malignancy. The area of patient's
concern in the MEDIAL portion of the RIGHT breast is too MEDIAL to
be evaluated on routine or spot compression views.

Mammographic images were processed with CAD.

On physical exam, there is an erythematous nontender firm fixed 1-2
centimeter mass in the 3 o'clock parasternal region of the RIGHT
breast.

Targeted ultrasound is performed, showing an irregular hypoechoic
mass with irregular margins and internal blood flow. Mass measures
1.6 x 1.4 x 1.5 centimeters. Mass appears to involve the pectoralis
muscle, abutting the ribs and LATERAL margin of the sternum.

No RIGHT axillary adenopathy. No internal mammary adenopathy seen
sonographically.
IMPRESSION: Suspicious mass in the 3 o'clock location of the RIGHT breast for
which biopsy is recommended.

RECOMMENDATION:
Ultrasound-guided core biopsy of mass in the 3 o'clock location of
the RIGHT breast.

I have discussed the findings and recommendations with the patient.
If applicable, a reminder letter will be sent to the patient
regarding the next appointment.

BI-RADS CATEGORY  4: Suspicious.

## 2020-05-08 ENCOUNTER — Encounter: Payer: Self-pay | Admitting: Radiation Oncology

## 2020-05-14 ENCOUNTER — Ambulatory Visit
Admission: RE | Admit: 2020-05-14 | Discharge: 2020-05-14 | Disposition: A | Discharge status: Home / Self Care | Payer: Medicare Other | Source: Ambulatory Visit | Attending: Radiation Oncology | Admitting: Radiation Oncology

## 2020-05-14 ENCOUNTER — Other Ambulatory Visit: Payer: Self-pay

## 2020-05-14 ENCOUNTER — Encounter: Payer: Self-pay | Admitting: Radiation Oncology

## 2020-05-14 DIAGNOSIS — Z79899 Other long term (current) drug therapy: Secondary | ICD-10-CM | POA: Insufficient documentation

## 2020-05-14 DIAGNOSIS — Z853 Personal history of malignant neoplasm of breast: Secondary | ICD-10-CM | POA: Diagnosis present

## 2020-05-14 DIAGNOSIS — C50211 Malignant neoplasm of upper-inner quadrant of right female breast: Secondary | ICD-10-CM

## 2020-05-14 HISTORY — DX: Personal history of irradiation: Z92.3

## 2020-05-14 NOTE — Progress Notes (Signed)
Radiation Oncology         [phone]) [phone] ________________________________  Name: Barbara Green MRN: [id number]  Date: 05/14/2020  DOB: Apr 19, 1940  Follow-Up Visit Note  CC: Nicolette Bang, DO  Caryl Never*    ICD-10-CM   1. Malignant neoplasm of upper-inner quadrant of right breast in female, estrogen receptor positive ([place])  C50.211    Z17.0     Diagnosis: StageIA(pT1c, pN0, cM0)RightBreast UIQ,Invasive DuctalCarcinoma, ER+/ PR+/ Her2-, Grade2  Interval Since Last Radiation: Nine months, two weeks, and two days  Radiation Treatment Dates: 06/29/2019 through 07/29/2019 Site Technique Total Dose (Gy) Dose per Fx (Gy) Completed Fx Beam Energies  Breast, Right: Breast_Rt 3D 40.05/40.05 2.67 15/15 6X  Breast, Right: Breast_Rt_Bst 3D 16/16 2 8/8 6X, 10X    Narrative:  The patient returns today for routine follow-up. No significant medical interval history since her last visit.  She has had some problems with her blood pressure and is on hydrochlorothiazide for this issue.  She tried amlodipine which did not work well.  Unfortunately the patient's husband of 60 years passed away over the past few months secondary to a major stroke.  On review of systems, she reports occasional twinges of pain along her lumpectomy scar but no consistent pain. She denies nipple discharge or bleeding.  She is not had her arm mammograms performed in light of events as above.  I have recommended she go ahead and get this scheduled sometime in the spring.  She missed her follow-up with Dr. Ninfa Linden in light of her husband's death..                       ALLERGIES:  has No Known Allergies.  Meds: Current Outpatient Medications  Medication Sig Dispense Refill  . hydrochlorothiazide (MICROZIDE) 12.5 MG capsule Take 1 capsule (12.5 mg total) by mouth daily. 90 capsule 1  . nicotine (NICODERM CQ - DOSED IN MG/24 HR) 7 mg/24hr patch Place 1 patch (7 mg total) onto the skin daily. 28  patch 1   No current facility-administered medications for this encounter.    Physical Findings: The patient is in no acute distress. Patient is alert and oriented.  weight is 117 lb 6.4 oz (53.3 kg). Her tympanic temperature is 97.2 F (36.2 C) (abnormal). Her blood pressure is 187/96 (abnormal) and her pulse is 103 (abnormal). Her respiration is 18 and oxygen saturation is 100%.  No significant changes. Lungs are clear to auscultation bilaterally. Heart has regular rate and rhythm. No palpable cervical, supraclavicular, or axillary adenopathy. Abdomen soft, non-tender, normal bowel sounds. Left breast: No palpable mass, nipple discharge, or bleeding.  Right breast: Breast is well-healed. Scar in the medial aspect of the breast that shows some induration and hyperpigmentation changes consistent with radiation effect.  No obvious signs of recurrence.  No nipple discharge or bleeding  Lab Findings: Lab Results  Component Value Date   WBC 10.4 02/08/2019   HGB 14.8 02/08/2019   HCT 43.8 02/08/2019   MCV 91 02/08/2019   PLT 335 02/08/2019    Radiographic Findings: No results found.  Impression: StageIA(pT1c, pN0, cM0)RightBreast UIQ,Invasive DuctalCarcinoma, ER+/ PR+/ Her2-, Grade2  No evidence of recurrence on clinical exam today.  Patient elected not to take an aromatase inhibitor.  Plan: The patient will follow-up with radiation oncology in six months.  As above she will go ahead and get her mammogram scheduled this spring.  I have asked her to make sure that the breast  center since me copies of her reports.  Total time spent in this encounter was 15 minutes which included reviewing the patient's most recent interval history, physical examination, and documentation. ____________________________________   Blair Promise, PhD, MD  This document serves as a record of services personally performed by Gery Pray, MD. It was created on his behalf by Clerance Lav, a trained  medical scribe. The creation of this record is based on the scribe's personal observations and the provider's statements to them. This document has been checked and approved by the attending provider.

## 2020-05-14 NOTE — Progress Notes (Signed)
Barbara Green is here today for follow up post radiation to the breast.   Breast Side:Right    They completed their radiation on: 07/29/19  Does the patient complain of any of the following: . Post radiation skin issues: continues to have hyperpigmentation. . Breast Tenderness: no . Breast Swelling: no . Lymphadema: no . Range of Motion limitations: full range of motion to right arm . Fatigue post radiation: Patient reports having more energy . Appetite good/fair/poor: good   Additional comments if applicable: Patient blood pressure elevated at time of visit. Patient reports recently starting on hydrochlorothiazide.  Rechecked BP at end of visit,  173/93. Encouraged patient to reach out to PCP regarding high readings. Patient voiced understanding.   Vitals:   05/14/20 1516  BP: (!) 187/96  Pulse: (!) 103  Resp: 18  Temp: (!) 97.2 F (36.2 C)  TempSrc: Tympanic  SpO2: 100%  Weight: 117 lb 6.4 oz (53.3 kg)

## 2020-05-15 ENCOUNTER — Telehealth: Payer: Self-pay | Admitting: *Deleted

## 2020-05-15 NOTE — Telephone Encounter (Signed)
Called patient to inform of fu with Dr. Sondra Come on 11-15-20 @ 4 pm, spoke with patient and she is aware of this appt.

## 2020-05-21 ENCOUNTER — Ambulatory Visit
Admission: EM | Admit: 2020-05-21 | Discharge: 2020-05-21 | Disposition: A | Discharge status: Home / Self Care | Payer: Medicare Other | Attending: Emergency Medicine | Admitting: Emergency Medicine

## 2020-05-21 ENCOUNTER — Encounter: Payer: Self-pay | Admitting: Emergency Medicine

## 2020-05-21 ENCOUNTER — Ambulatory Visit (INDEPENDENT_AMBULATORY_CARE_PROVIDER_SITE_OTHER): Payer: Medicare Other

## 2020-05-21 ENCOUNTER — Telehealth: Payer: Self-pay

## 2020-05-21 ENCOUNTER — Other Ambulatory Visit: Payer: Self-pay

## 2020-05-21 DIAGNOSIS — S42024A Nondisplaced fracture of shaft of right clavicle, initial encounter for closed fracture: Secondary | ICD-10-CM | POA: Diagnosis not present

## 2020-05-21 DIAGNOSIS — M25511 Pain in right shoulder: Secondary | ICD-10-CM

## 2020-05-21 DIAGNOSIS — W19XXXA Unspecified fall, initial encounter: Secondary | ICD-10-CM | POA: Diagnosis not present

## 2020-05-21 MED ORDER — IBUPROFEN 600 MG PO TABS: 600.0000 mg | ORAL_TABLET | Freq: Four times a day (QID) | ORAL | 0 refills | Status: DC | PRN

## 2020-05-21 MED ORDER — TRAMADOL HCL 50 MG PO TABS: 50.0000 mg | ORAL_TABLET | Freq: Four times a day (QID) | ORAL | 0 refills | Status: DC | PRN

## 2020-05-21 NOTE — ED Triage Notes (Signed)
Pt presents with right shoulder pain after slipping on gumballs on back deck 1 week ago. States fell into the railing of steps. Has taken ibuprofen and tylenol for the pain but only temporary relief.

## 2020-05-21 NOTE — ED Provider Notes (Signed)
[name]    CSN: [id number] Arrival date & time: 05/21/20  0102      History   Chief Complaint Chief Complaint  Patient presents with  . Shoulder Pain  . Fall    HPI Barbara Green is a 80 y.o. female presenting today for evaluation of right shoulder pain.  Reports slipped and landed on right shoulder approximately 1 week ago.  Reports continued pain to her right upper chest and shoulder area.  Pain worsens throughout the day.  Using Biofreeze, Tylenol and ibuprofen.  HPI  Past Medical History:  Diagnosis Date  . History of radiation therapy 06/29/19-07/29/19   Right breast IMRT- Dr. Sondra Come  . Hypertension     Patient Active Problem List   Diagnosis Date Noted  . Malignant neoplasm of upper-inner quadrant of right breast in female, estrogen receptor positive ([place]) 04/20/2019  . Tobacco dependence 02/08/2019  . Unspecified lump in the right breast, upper inner quadrant  02/08/2019  . Essential hypertension 02/08/2019  . Tachycardia 02/08/2019    Past Surgical History:  Procedure Laterality Date  . BREAST LUMPECTOMY WITH SENTINEL LYMPH NODE BIOPSY Right 05/17/2019   Procedure: RIGHT BREAST LUMPECTOMY WITH SENTINEL LYMPH NODE BIOPSY;  Surgeon: Coralie Keens, MD;  Location: [facility];  Service: General;  Laterality: Right;  . NO PAST SURGERIES      OB History   No obstetric history on file.      Home Medications    Prior to Admission medications   Medication Sig Start Date End Date Taking? Authorizing Provider  ibuprofen (ADVIL) 600 MG tablet Take 1 tablet (600 mg total) by mouth every 6 (six) hours as needed. 05/21/20  Yes Shakeerah Gradel C, PA-C  traMADol (ULTRAM) 50 MG tablet Take 1 tablet (50 mg total) by mouth every 6 (six) hours as needed for severe pain. 05/21/20  Yes Gilmore List C, PA-C  hydrochlorothiazide (MICROZIDE) 12.5 MG capsule Take 1 capsule (12.5 mg total) by mouth daily. 04/30/20   Nicolette Bang, DO   nicotine (NICODERM CQ - DOSED IN MG/24 HR) 7 mg/24hr patch Place 1 patch (7 mg total) onto the skin daily. 04/30/20   Nicolette Bang, DO    Family History Family History  Problem Relation Age of Onset  . Alzheimer's disease Mother   . Heart disease Father   . Alcohol abuse Father     Social History Social History   Tobacco Use  . Smoking status: Current Every Day Smoker    Packs/day: 0.33    Years: 30.00    Pack years: 9.90  . Smokeless tobacco: Never Used  Vaping Use  . Vaping Use: Never used  Substance Use Topics  . Alcohol use: Yes    Comment: occasionally a glass wine  . Drug use: Never     Allergies   Patient has no known allergies.   Review of Systems Review of Systems  Constitutional: Negative for fatigue and fever.  Eyes: Negative for visual disturbance.  Respiratory: Negative for shortness of breath.   Cardiovascular: Negative for chest pain.  Gastrointestinal: Negative for abdominal pain, nausea and vomiting.  Musculoskeletal: Positive for arthralgias. Negative for joint swelling.  Skin: Negative for color change, rash and wound.  Neurological: Negative for dizziness, weakness, light-headedness and headaches.     Physical Exam Triage Vital Signs ED Triage Vitals  Enc Vitals Group     BP 05/21/20 1006 (!) 170/98     Pulse Rate 05/21/20 1006 95  Resp 05/21/20 1006 18     Temp 05/21/20 1006 98 F (36.7 C)     Temp Source 05/21/20 1006 Oral     SpO2 05/21/20 1006 95 %     Weight --      Height --      Head Circumference --      Peak Flow --      Pain Score 05/21/20 1007 6     Pain Loc --      Pain Edu? --      Excl. in [place]? --    No data found.  Updated Vital Signs BP (!) 170/98 (BP Location: Left Arm)   Pulse 95   Temp 98 F (36.7 C) (Oral)   Resp 18   SpO2 95%   Visual Acuity Right Eye Distance:   Left Eye Distance:   Bilateral Distance:    Right Eye Near:   Left Eye Near:    Bilateral Near:     Physical  Exam Vitals and nursing note reviewed.  Constitutional:      Appearance: She is well-developed.     Comments: No acute distress  HENT:     Head: Normocephalic and atraumatic.     Nose: Nose normal.  Eyes:     Conjunctiva/sclera: Conjunctivae normal.  Cardiovascular:     Rate and Rhythm: Normal rate.  Pulmonary:     Effort: Pulmonary effort is normal. No respiratory distress.  Abdominal:     General: There is no distension.  Musculoskeletal:        General: Normal range of motion.     Cervical back: Neck supple.     Comments: Right shoulder: No obvious swelling or deformity, nontender to palpation along length of clavicle, AC joint or scapular spine, tenderness to palpation to right upper chest and infraclavicular area extending to anterior shoulder, nontender to palpation to trapezius/upper thoracic area, nontender throughout proximal humerus, full active range of motion of right shoulder, negative resisted external rotation  Skin:    General: Skin is warm and dry.  Neurological:     Mental Status: She is alert and oriented to person, place, and time.      UC Treatments / Results  Labs (all labs ordered are listed, but only abnormal results are displayed) Labs Reviewed - No data to display  EKG   Radiology DG Shoulder Right  Result Date: 05/21/2020 CLINICAL DATA:  Right shoulder pain after falling 1 week previous EXAM: RIGHT SHOULDER - 2+ VIEW COMPARISON:  None. FINDINGS: Acute minimally displaced fracture through the mid aspect of the right clavicle. The bones appear diffusely demineralized. No other acute fracture or malalignment identified. IMPRESSION: Acute minimally displaced fracture through the mid shaft of the right clavicle. Electronically Signed   By: Jacqulynn Cadet M.D.   On: 05/21/2020 10:49    Procedures Procedures (including critical care time)  Medications Ordered in UC Medications - No data to display  Initial Impression / Assessment and Plan / UC  Course  I have reviewed the triage vital signs and the nursing notes.  Pertinent labs & imaging results that were available during my care of the patient were reviewed by me and considered in my medical decision making (see chart for details).     Acute right nondisplaced clavicle fracture-placing in sling immobilizer and will have follow-up with sports medicine/orthopedics.  Tylenol and ibuprofen for mild to moderate pain, tramadol for severe pain.  Neurovascularly intact.  Apply ice  Discussed strict return precautions. Patient  verbalized understanding and is agreeable with plan.  Final Clinical Impressions(s) / UC Diagnoses   Final diagnoses:  Closed nondisplaced fracture of shaft of right clavicle, initial encounter     Discharge Instructions     Right clavicle fracture Follow-up with sports medicine/orthopedics for further monitoring of healing of fractures Wear shoulder immobilizer until cleared by sports medicine Tylenol/ibuprofen for mild to moderate pain Tramadol for severe pain-will cause drowsiness, do not drive or work after taking Apply ice to area Follow-up for any concerns    ED Prescriptions    Medication Sig Dispense Auth. Provider   ibuprofen (ADVIL) 600 MG tablet Take 1 tablet (600 mg total) by mouth every 6 (six) hours as needed. 30 tablet Gaven Eugene C, PA-C   traMADol (ULTRAM) 50 MG tablet Take 1 tablet (50 mg total) by mouth every 6 (six) hours as needed for severe pain. 12 tablet Pleas Carneal, [place] C, PA-C     I have reviewed the PDMP during this encounter.   Janith Lima, PA-C 05/21/20 1102

## 2020-05-21 NOTE — Discharge Instructions (Signed)
Right clavicle fracture Follow-up with sports medicine/orthopedics for further monitoring of healing of fractures Wear shoulder immobilizer until cleared by sports medicine Tylenol/ibuprofen for mild to moderate pain Tramadol for severe pain-will cause drowsiness, do not drive or work after taking Apply ice to area Follow-up for any concerns

## 2020-05-30 ENCOUNTER — Ambulatory Visit (INDEPENDENT_AMBULATORY_CARE_PROVIDER_SITE_OTHER): Payer: Medicare Other | Admitting: Family Medicine

## 2020-05-30 ENCOUNTER — Other Ambulatory Visit: Payer: Self-pay

## 2020-05-30 VITALS — BP 150/90 | Ht 60.0 in | Wt 112.0 lb

## 2020-05-30 DIAGNOSIS — S42024D Nondisplaced fracture of shaft of right clavicle, subsequent encounter for fracture with routine healing: Secondary | ICD-10-CM | POA: Diagnosis not present

## 2020-05-30 NOTE — Progress Notes (Signed)
PCP: Nicolette Bang, DO  Subjective:   HPI: Patient is a 80 y.o. female here for right shoulder pain.  Had a fall the end of April over deck and hit posterior shoulder on railing. Present to UC 1 wk later and was found to have a clavicle fracture. Given Ibuprofen and tramadol. Ibuprofen has helped but did not take tramadol long because it made her head swim. Pain is worse at night and in the early morning causing sleep disturbance.   Past Medical History:  Diagnosis Date  . History of radiation therapy 06/29/19-07/29/19   Right breast IMRT- Dr. Sondra Come  . Hypertension     Current Outpatient Medications on File Prior to Visit  Medication Sig Dispense Refill  . hydrochlorothiazide (MICROZIDE) 12.5 MG capsule Take 1 capsule (12.5 mg total) by mouth daily. 90 capsule 1  . ibuprofen (ADVIL) 600 MG tablet Take 1 tablet (600 mg total) by mouth every 6 (six) hours as needed. 30 tablet 0  . nicotine (NICODERM CQ - DOSED IN MG/24 HR) 7 mg/24hr patch Place 1 patch (7 mg total) onto the skin daily. 28 patch 1  . traMADol (ULTRAM) 50 MG tablet Take 1 tablet (50 mg total) by mouth every 6 (six) hours as needed for severe pain. (Patient not taking: Reported on 05/30/2020) 12 tablet 0   No current facility-administered medications on file prior to visit.    Past Surgical History:  Procedure Laterality Date  . BREAST LUMPECTOMY WITH SENTINEL LYMPH NODE BIOPSY Right 05/17/2019   Procedure: RIGHT BREAST LUMPECTOMY WITH SENTINEL LYMPH NODE BIOPSY;  Surgeon: Coralie Keens, MD;  Location: [facility];  Service: General;  Laterality: Right;  . NO PAST SURGERIES      No Known Allergies  Social History   Socioeconomic History  . Marital status: Married    Spouse name: Not on file  . Number of children: 3  . Years of education: Not on file  . Highest education level: 12th grade  Occupational History  . Not on file  Tobacco Use  . Smoking status: Current Every Day Smoker     Packs/day: 0.33    Years: 30.00    Pack years: 9.90  . Smokeless tobacco: Never Used  Vaping Use  . Vaping Use: Never used  Substance and Sexual Activity  . Alcohol use: Yes    Comment: occasionally a glass wine  . Drug use: Never  . Sexual activity: Not on file  Other Topics Concern  . Not on file  Social History Narrative  . Not on file   Social Determinants of Health   Financial Resource Strain: Not on file  Food Insecurity: Not on file  Transportation Needs: Not on file  Physical Activity: Not on file  Stress: Not on file  Social Connections: Not on file  Intimate Partner Violence: Not on file    Family History  Problem Relation Age of Onset  . Alzheimer's disease Mother   . Heart disease Father   . Alcohol abuse Father     BP (!) 150/90   Ht 5' (1.524 m)   Wt 112 lb (50.8 kg)   BMI 21.87 kg/m   No flowsheet data found.  No flowsheet data found.  Review of Systems: See HPI above.     Objective:  Physical Exam:  Gen: NAD, comfortable in exam room No swelling, ecchymoses.  No gross deformity. TTP surrounding fracture site at anterior right shoulder. FROM w/ discomfort  Strength 5/5 NV intact distally.  Assessment & Plan:  1. Right clavicle fracture Reviewed UC imaging and notable right clavicle fracture with minimal displacement. Patient currently in sling. Discussed continued use of sling for a total duration of at least 4 weeks. NSAIDs for pain relief. Discussed meloxicam. Patient prefers to continue with ibuprofen. Can alternate with tylenol for breakthrough pain throughout the day. -Continue shoulder sling  -Obtain plain films prior to f/u to look for increased displacement -F/u in 2-3 weeks  Simone Autry-Lott, DO   I have seen and evaluated this patient independently, and agree with the resident's note as above.  Dagoberto Ligas, MD Sports Medicine Fellow, [place]  Addendum:  I was the preceptor for this visit and available for  immediate consultation.  Karlton Lemon MD Kirt Boys

## 2020-06-13 ENCOUNTER — Ambulatory Visit
Admission: RE | Admit: 2020-06-13 | Discharge: 2020-06-13 | Disposition: A | Discharge status: Home / Self Care | Payer: Medicare Other | Source: Ambulatory Visit | Attending: Family Medicine | Admitting: Family Medicine

## 2020-06-13 DIAGNOSIS — S42024D Nondisplaced fracture of shaft of right clavicle, subsequent encounter for fracture with routine healing: Secondary | ICD-10-CM

## 2020-06-20 ENCOUNTER — Ambulatory Visit (INDEPENDENT_AMBULATORY_CARE_PROVIDER_SITE_OTHER): Payer: Medicare Other | Admitting: Family Medicine

## 2020-06-20 ENCOUNTER — Encounter: Payer: Self-pay | Admitting: Family Medicine

## 2020-06-20 ENCOUNTER — Other Ambulatory Visit: Payer: Self-pay

## 2020-06-20 VITALS — BP 143/76 | Ht 60.0 in | Wt 112.0 lb

## 2020-06-20 DIAGNOSIS — S42024D Nondisplaced fracture of shaft of right clavicle, subsequent encounter for fracture with routine healing: Secondary | ICD-10-CM

## 2020-06-20 NOTE — Progress Notes (Signed)
   PCP: Nicolette Bang, DO  Subjective:   HPI: Patient is a 80 y.o. female here for follow-up on right mid clavicle fracture.  Initial injury mid August, she was initially seen a week later on 05/21/2020 at urgent care, was placed in a sling.  Followed up with [place] 05/30/2020 and was continued in the sling with plan to follow-up today and repeat x-rays.  Today, patient states she has no new concerns, pain has significantly subsided and she is having minimal pain at this point.  She has been using the sling for the most part but admits to taking it off sometimes during the day and at night when sleeping.  No new concerns, no new trauma or injury.   Review of Systems:  Per HPI.   [place], medications and smoking status reviewed.      Objective:  Physical Exam:  No flowsheet data found.   Gen: awake, alert, NAD, comfortable in exam room Pulm: breathing unlabored  Right shoulder/clavicle: -Inspection: There is a small nodule over the mid clavicle near the area of fracture.  No effusion, erythema or edema of the shoulder. -Palpation: Nontender palpation along the clavicle. -ROM: Almost full extension of the elbow, full pronation and supination of the elbow.  Some stiffness with end abduction and internal rotation of the shoulder, otherwise full range of motion. -Strength: 5/5 strength in all planes of shoulder and elbow -Neurovascular intact in the right upper extremity  Imaging: I reviewed the x-ray and radiology read of 2 view x-ray of the right clavicle shows interval healing with callus formation.  No significant displacement since initial x-ray.   Assessment & Plan:  1.  5 weeks status post right mid clavicle fracture, closed, nondisplaced Patient is doing well, has had near resolution of pain and good callus formation.  She is nontender on exam.  We will plan to come out of the sling, and start range of motion exercises at home of the shoulder and elbow.  She already has pretty  good range of motion today which is reassuring.  Discussed that her bone is not complete healing and to be very careful and avoid any activities that could cause falls or trauma to the area.  We will follow-up in 1 month for final examination, likely will not need rpt x-ray or PT if she is doing well.   Dagoberto Ligas, MD [facility] Fellow 06/20/2020 1:43 PM  Addendum:  I was the preceptor for this visit and available for immediate consultation.  Karlton Lemon MD Kirt Boys

## 2020-07-18 ENCOUNTER — Encounter: Payer: Self-pay | Admitting: Family Medicine

## 2020-07-18 ENCOUNTER — Other Ambulatory Visit: Payer: Self-pay

## 2020-07-18 ENCOUNTER — Ambulatory Visit (INDEPENDENT_AMBULATORY_CARE_PROVIDER_SITE_OTHER): Payer: Medicare Other | Admitting: Family Medicine

## 2020-07-18 VITALS — BP 140/82 | Ht 60.0 in | Wt 116.0 lb

## 2020-07-18 DIAGNOSIS — S42024D Nondisplaced fracture of shaft of right clavicle, subsequent encounter for fracture with routine healing: Secondary | ICD-10-CM

## 2020-07-18 NOTE — Progress Notes (Signed)
   PCP: Nicolette Bang, MD  Subjective:   HPI: Patient is a 80 y.o. female here for follow-up on right mid clavicle fracture.  Initial injury mid April, she was initially seen a week later on 05/21/2020 at urgent care, was placed in a sling.  Followed up with [place] 05/30/2020 and was continued in the sling.  Follow-up on 5/25 showed interval healing on x-ray and she was doing well, transitioned out of the sling.   Today, patient states she has no pain over her clavicle.  She has full range of motion of her shoulder and elbow and has not noticed any problems whatsoever.  No numbness or tingling, no new falls, no new concerns.   Review of Systems:  Per HPI.   [place], medications and smoking status reviewed.      Objective:  Physical Exam:  No flowsheet data found.   Gen: awake, alert, NAD, comfortable in exam room Pulm: breathing unlabored  Right shoulder/clavicle: -Inspection: There is a small nodule over the mid clavicle near the area of fracture.  No effusion, erythema or edema of the shoulder. -Palpation: Nontender palpation along the clavicle. -ROM: full extension of the elbow, full pronation and supination of the elbow.  Full range of motion in all planes of right shoulder. -Strength: 5/5 strength in all planes of shoulder and elbow -Neurovascular intact in the right upper extremity   Assessment & Plan:  1. 9-10 weeks status post right mid clavicle fracture, closed, nondisplaced  Patient is doing well, no pain tenderness on exam today, full range of motion of the elbow and shoulder.  Given this, she is okay to continue activities as tolerated.  No need for follow-up x-ray given clinical picture.  We will follow-up as needed if any questions or concerns.   Dagoberto Ligas, MD [facility] Fellow 07/18/2020 1:05 PM  I was the preceptor for this visit and available for immediate consultation Shellia Cleverly, DO

## 2020-10-29 ENCOUNTER — Other Ambulatory Visit: Payer: Self-pay | Admitting: Radiation Oncology

## 2020-10-29 DIAGNOSIS — Z9889 Other specified postprocedural states: Secondary | ICD-10-CM

## 2020-10-31 ENCOUNTER — Telehealth: Payer: Self-pay | Admitting: *Deleted

## 2020-10-31 NOTE — Telephone Encounter (Signed)
RETURNED PATIENT'S PHONE CALL, SPOKE WITH PATIENT. ?

## 2020-11-15 ENCOUNTER — Ambulatory Visit: Payer: Self-pay | Admitting: Radiation Oncology

## 2020-11-28 ENCOUNTER — Ambulatory Visit
Admission: RE | Admit: 2020-11-28 | Discharge: 2020-11-28 | Disposition: A | Discharge status: Home / Self Care | Payer: Medicare Other | Source: Ambulatory Visit | Attending: Radiation Oncology | Admitting: Radiation Oncology

## 2020-11-28 ENCOUNTER — Telehealth: Payer: Self-pay | Admitting: Family Medicine

## 2020-11-28 ENCOUNTER — Other Ambulatory Visit: Payer: Self-pay

## 2020-11-28 DIAGNOSIS — Z9889 Other specified postprocedural states: Secondary | ICD-10-CM

## 2020-11-28 NOTE — Progress Notes (Signed)
Radiation Oncology         [phone] ________________________________  Name: Barbara Green MRN: [id number]  Date: 11/29/2020  DOB: May 02, 1940  Follow-Up Visit Note  CC: Dorna Mai, MD  Caryl Never*    ICD-10-CM   1. Malignant neoplasm of upper-inner quadrant of right breast in female, estrogen receptor positive ([facility])  C50.211    Z17.0       Diagnosis: Stage IA (pT1c, pN0, cM0) Right Breast UIQ, Invasive Ductal Carcinoma, ER+ / PR+ / Her2-, Grade 2  Interval Since Last Radiation: 1 year, 4 months, and 1 day   Radiation Treatment Dates: 06/29/2019 through 07/29/2019 Site Technique Total Dose (Gy) Dose per Fx (Gy) Completed Fx Beam Energies  Breast, Right: Breast_Rt 3D 40.05/40.05 2.67 15/15 6X  Breast, Right: Breast_Rt_Bst 3D 16/16 2 8/8 6X, 10X    Narrative:  The patient returns today for routine follow-up, she was last seen here for follow-up on 05/14/20. Since her last visit, the patient presented to an Urgent Care center on 05/21/20 with right shoulder pain following a fall 1 week prior where she landed on her shoulder. Patient reported continuous pain to her right upper chest and shoulder, worsening though out the day. Right shoulder XR revealed an acute minimally displaced fracture through the mid shaft of the right clavicle. Patient was accordingly placed in sling immobilizer and instructed to follow-up with sports medicine/ orthopedics. Follow-up right clavicular XR on 06/13/20 showed the mild clavicle fracture with stable alignment and present callus.       She denies any further pain from this fracture.  She is a little sore today in her right shoulder area after doing a lot of yard work yesterday.  She denies any pain in either breast nipple discharge or bleeding  Diagnostic bilateral mammogram on 11/28/20 demonstrated no mammographic evidence of malignancy in either breast. Right breast post-treatment changes were also appreciated in the right breast.                         Allergies:  has No Known Allergies.  Meds: Current Outpatient Medications  Medication Sig Dispense Refill   acetaminophen (TYLENOL) 325 MG tablet Take 650 mg by mouth as needed.     nicotine (NICODERM CQ - DOSED IN MG/24 HR) 7 mg/24hr patch Place 1 patch (7 mg total) onto the skin daily. 28 patch 1   hydrochlorothiazide (MICROZIDE) 12.5 MG capsule Take 1 capsule (12.5 mg total) by mouth daily. (Patient not taking: Reported on 11/29/2020) 90 capsule 1   ibuprofen (ADVIL) 600 MG tablet Take 1 tablet (600 mg total) by mouth every 6 (six) hours as needed. (Patient not taking: Reported on 11/29/2020) 30 tablet 0   traMADol (ULTRAM) 50 MG tablet Take 1 tablet (50 mg total) by mouth every 6 (six) hours as needed for severe pain. (Patient not taking: No sig reported) 12 tablet 0   No current facility-administered medications for this encounter.    Physical Findings: The patient is in no acute distress. Patient is alert and oriented.  height is 5' (1.524 m) and weight is 118 lb (53.5 kg). Her temperature is 97.7 F (36.5 C). Her blood pressure is 156/85 (abnormal) and her pulse is 103 (abnormal). Her respiration is 20 and oxygen saturation is 100%. .  No significant changes. Lungs are clear to auscultation bilaterally. Heart has regular rate and rhythm. No palpable cervical, supraclavicular, or axillary adenopathy. Abdomen soft, non-tender, normal bowel sounds.  Left Breast: no palpable mass, nipple discharge or bleeding.  Right breast exam shows some moderate edema.  No dominant mass appreciated in the breast nipple discharge or bleeding.  Patient has a medially placed scar in the lower breast approaching the sternum area.  Careful inspection of this area reveals no evidence of recurrence.    Lab Findings: Lab Results  Component Value Date   WBC 10.4 02/08/2019   HGB 14.8 02/08/2019   HCT 43.8 02/08/2019   MCV 91 02/08/2019   PLT 335 02/08/2019    Radiographic Findings: MM  DIAG BREAST TOMO BILATERAL  Result Date: 11/28/2020 CLINICAL DATA:  80 year old female status post malignant right lumpectomy with radiation therapy in 2021. EXAM: DIGITAL DIAGNOSTIC BILATERAL MAMMOGRAM WITH TOMOSYNTHESIS AND CAD TECHNIQUE: Bilateral digital diagnostic mammography and breast tomosynthesis was performed. The images were evaluated with computer-aided detection. COMPARISON:  Previous exam(s). ACR Breast Density Category c: The breast tissue is heterogeneously dense, which may obscure small masses. FINDINGS: Diffuse right-sided skin and trabecular thickening is consistent with radiation related changes. Otherwise, no new or suspicious findings identified. Due to the far medial location of the lumpectomy bed, it is not included in today's mammographic views. IMPRESSION: 1. No mammographic evidence of malignancy in either breast. 2. Right breast posttreatment changes. RECOMMENDATION: Diagnostic mammogram is suggested in 1 year. (Code:[id number]-01Y) I have discussed the findings and recommendations with the patient. If applicable, a reminder letter will be sent to the patient regarding the next appointment. BI-RADS CATEGORY  2: Benign. Electronically Signed   By: Kristopher Oppenheim M.D.   On: 11/28/2020 16:35    Impression:  Stage IA (pT1c, pN0, cM0) Right Breast UIQ, Invasive Ductal Carcinoma, ER+ / PR+ / Her2-, Grade 2  No evidence of recurrence on clinical exam.  The patient exhibits no long-term effects from her radiation therapy.  As above she does have some breast lymphedema but does not not seem to bother her.  Patient elected not to proceed with adjuvant hormonal therapy and is not following up with medical oncology.  she has not been following with her surgeon.  Plan: Routine follow-up in 6 months.  She will proceed with mammogram in 1 year.   20 minutes of total time was spent for this patient encounter, including preparation, face-to-face counseling with the patient and coordination of care,  physical exam, and documentation of the encounter. ____________________________________  Blair Promise, PhD, MD   This document serves as a record of services personally performed by Gery Pray, MD. It was created on his behalf by Roney Mans, a trained medical scribe. The creation of this record is based on the scribe's personal observations and the provider's statements to them. This document has been checked and approved by the attending provider.

## 2020-11-28 NOTE — Telephone Encounter (Signed)
Dr. Alcario Drought pt states she's low on this medication and asked for an emergency refill and scheduled the next available she can come due to other scheduled appts she has this week.  hydrochlorothiazide (MICROZIDE) 12.5 MG capsule [[id number]]   Pharmacy  [facility] [address]  [place], [place] [address]  Phone:  [phone]  Fax:  [phone]

## 2020-11-29 ENCOUNTER — Ambulatory Visit
Admission: RE | Admit: 2020-11-29 | Discharge: 2020-11-29 | Disposition: A | Discharge status: Home / Self Care | Payer: Medicare Other | Source: Ambulatory Visit | Attending: Radiation Oncology | Admitting: Radiation Oncology

## 2020-11-29 ENCOUNTER — Encounter: Payer: Self-pay | Admitting: Radiation Oncology

## 2020-11-29 ENCOUNTER — Other Ambulatory Visit: Payer: Self-pay | Admitting: *Deleted

## 2020-11-29 VITALS — BP 156/85 | HR 103 | Temp 97.7°F | Resp 20 | Ht 60.0 in | Wt 118.0 lb

## 2020-11-29 DIAGNOSIS — Z170 Estrogen receptor positive status [ER+]: Secondary | ICD-10-CM | POA: Diagnosis not present

## 2020-11-29 DIAGNOSIS — Z79899 Other long term (current) drug therapy: Secondary | ICD-10-CM | POA: Diagnosis not present

## 2020-11-29 DIAGNOSIS — C50211 Malignant neoplasm of upper-inner quadrant of right female breast: Secondary | ICD-10-CM | POA: Diagnosis present

## 2020-11-29 DIAGNOSIS — Z923 Personal history of irradiation: Secondary | ICD-10-CM | POA: Insufficient documentation

## 2020-11-29 DIAGNOSIS — I10 Essential (primary) hypertension: Secondary | ICD-10-CM

## 2020-11-29 MED ORDER — HYDROCHLOROTHIAZIDE 12.5 MG PO CAPS: 12.5000 mg | ORAL_CAPSULE | Freq: Every day | ORAL | 0 refills | Status: DC

## 2020-11-29 NOTE — Telephone Encounter (Signed)
A 30 day supply has been sent

## 2020-11-29 NOTE — Progress Notes (Signed)
Barbara Green is here today for follow up post radiation to the breast.   Breast Side:right   They completed their radiation on: 07/29/2019   Does the patient complain of any of the following: Post radiation skin issues: n/a Breast Tenderness: denies Breast Swelling: denies Lymphadema: denies Range of Motion limitations: demonstrates full range of motion Fatigue post radiation: denies Appetite good/fair/poor: good Sleeping well Pain to right shoulder from yard work relieved with ice and tylenol.  Additional comments if applicable: none  Vitals:   11/29/20 0947  BP: (!) 156/85  Pulse: (!) 103  Resp: 20  Temp: 97.7 F (36.5 C)  SpO2: 100%  Weight: 118 lb (53.5 kg)  Height: 5' (1.524 m)

## 2020-12-05 ENCOUNTER — Other Ambulatory Visit: Payer: Self-pay

## 2020-12-05 ENCOUNTER — Encounter: Payer: Self-pay | Admitting: Family Medicine

## 2020-12-05 ENCOUNTER — Ambulatory Visit (INDEPENDENT_AMBULATORY_CARE_PROVIDER_SITE_OTHER): Payer: Medicare Other | Admitting: Family Medicine

## 2020-12-05 VITALS — BP 150/87 | HR 96 | Temp 98.1°F | Resp 16 | Wt 117.2 lb

## 2020-12-05 DIAGNOSIS — I10 Essential (primary) hypertension: Secondary | ICD-10-CM | POA: Diagnosis not present

## 2020-12-05 DIAGNOSIS — F17200 Nicotine dependence, unspecified, uncomplicated: Secondary | ICD-10-CM

## 2020-12-05 DIAGNOSIS — F17210 Nicotine dependence, cigarettes, uncomplicated: Secondary | ICD-10-CM | POA: Diagnosis not present

## 2020-12-05 MED ORDER — HYDROCHLOROTHIAZIDE 12.5 MG PO CAPS: 12.5000 mg | ORAL_CAPSULE | Freq: Every day | ORAL | 1 refills | Status: DC

## 2020-12-05 NOTE — Progress Notes (Signed)
Established Patient Office Visit  Subjective:  Patient ID: Barbara Green, female    DOB: 10-24-1940  Age: 80 y.o. MRN: [id number]  CC:  Chief Complaint  Patient presents with   Follow-up    HPI Barbara Green presents for follow up of hypertension. Patient reports that she has been out of meds. She denies any acute complaints.   Past Medical History:  Diagnosis Date   History of radiation therapy 06/29/19-07/29/19   Right breast IMRT- Dr. Sondra Come   Hypertension     Past Surgical History:  Procedure Laterality Date   BREAST LUMPECTOMY WITH SENTINEL LYMPH NODE BIOPSY Right 05/17/2019   Procedure: RIGHT BREAST LUMPECTOMY WITH SENTINEL LYMPH NODE BIOPSY;  Surgeon: Coralie Keens, MD;  Location: [facility];  Service: General;  Laterality: Right;   NO PAST SURGERIES        Social History   Socioeconomic History   Marital status: Married    Spouse name: Not on file   Number of children: 3   Years of education: Not on file   Highest education level: 12th grade  Occupational History   Not on file  Tobacco Use   Smoking status: Every Day    Packs/day: 0.33    Years: 30.00    Pack years: 9.90    Types: Cigarettes   Smokeless tobacco: Never  Vaping Use   Vaping Use: Never used  Substance and Sexual Activity   Alcohol use: Yes    Comment: occasionally a glass wine   Drug use: Never   Sexual activity: Not on file  Other Topics Concern   Not on file  Social History Narrative   Not on file   Social Determinants of Health   Financial Resource Strain: Not on file  Food Insecurity: Not on file  Transportation Needs: Not on file  Physical Activity: Not on file  Stress: Not on file  Social Connections: Not on file  Intimate Partner Violence: Not on file    ROS Review of Systems  All other systems reviewed and are negative.  Objective:   Today's Vitals: BP (!) 150/87   Pulse 96   Temp 98.1 F (36.7 C) (Oral)   Resp 16   Wt 117 lb 3.2 oz (53.2  kg)   SpO2 96%   BMI 22.89 kg/m   Physical Exam Vitals and nursing note reviewed.  Constitutional:      General: She is not in acute distress. Cardiovascular:     Rate and Rhythm: Normal rate and regular rhythm.  Pulmonary:     Effort: Pulmonary effort is normal.     Breath sounds: Normal breath sounds.  Abdominal:     Palpations: Abdomen is soft.     Tenderness: There is no abdominal tenderness.  Musculoskeletal:     Right lower leg: No edema.     Left lower leg: No edema.  Neurological:     General: No focal deficit present.     Mental Status: She is alert and oriented to person, place, and time.    Assessment & Plan:   1. Essential hypertension Slightly elevated readings most likely due to not having meds. Meds refilled. Will monitor - hydrochlorothiazide (MICROZIDE) 12.5 MG capsule; Take 1 capsule (12.5 mg total) by mouth daily.  Dispense: 90 capsule; Refill: 1  2. Tobacco dependence Encouraged reduction/cessation.    Outpatient Encounter Medications as of 12/05/2020  Medication Sig   acetaminophen (TYLENOL) 325 MG tablet Take 650 mg by mouth as  needed.   hydrochlorothiazide (MICROZIDE) 12.5 MG capsule Take 1 capsule (12.5 mg total) by mouth daily.   ibuprofen (ADVIL) 600 MG tablet Take 1 tablet (600 mg total) by mouth every 6 (six) hours as needed.   nicotine (NICODERM CQ - DOSED IN MG/24 HR) 7 mg/24hr patch Place 1 patch (7 mg total) onto the skin daily.   traMADol (ULTRAM) 50 MG tablet Take 1 tablet (50 mg total) by mouth every 6 (six) hours as needed for severe pain.   No facility-administered encounter medications on file as of 12/05/2020.    Follow-up: No follow-ups on file.   Becky Sax, MD

## 2020-12-12 ENCOUNTER — Other Ambulatory Visit: Payer: Self-pay

## 2020-12-12 ENCOUNTER — Encounter: Payer: Self-pay | Admitting: Emergency Medicine

## 2020-12-12 ENCOUNTER — Ambulatory Visit
Admission: EM | Admit: 2020-12-12 | Discharge: 2020-12-12 | Disposition: A | Discharge status: Home / Self Care | Payer: Medicare Other | Attending: Physician Assistant | Admitting: Physician Assistant

## 2020-12-12 DIAGNOSIS — S46811A Strain of other muscles, fascia and tendons at shoulder and upper arm level, right arm, initial encounter: Secondary | ICD-10-CM

## 2020-12-12 MED ORDER — TIZANIDINE HCL 4 MG PO TABS: 4.0000 mg | ORAL_TABLET | Freq: Four times a day (QID) | ORAL | 0 refills | Status: DC | PRN

## 2020-12-12 MED ORDER — PREDNISONE 20 MG PO TABS: 40.0000 mg | ORAL_TABLET | Freq: Every day | ORAL | 0 refills | Status: AC

## 2020-12-12 NOTE — ED Triage Notes (Signed)
Patient c/o doing some yard work about a week ago, started having some neck/right shoulder pain.  Patient c/o that the pain is radiating up into her head.  Has applied ice, taken Tylenol and Ibuprofen.

## 2020-12-12 NOTE — ED Provider Notes (Signed)
[name]    CSN: [id number] Arrival date & time: 12/12/20  1027      History   Chief Complaint Chief Complaint  Patient presents with   Neck Pain    HPI Barbara Green is a 80 y.o. female.   Patient here today for evaluation of right shoulder and neck pain that started about a week ago after she had been doing some yard work.  She states that she was pruning and did not have pain with activity, however soon after developed some right shoulder pain.  She reports that pain has now radiated into her neck on the right side and is starting to develop some head pain as well.  She has tried using ice and heat as well as Tylenol ibuprofen with mild relief.  She does not report any numbness or tingling.  Movement of her right arm and shoulder seems to make pain worse.  The history is provided by the patient.  Neck Pain Associated symptoms: no fever and no numbness    Past Medical History:  Diagnosis Date   History of radiation therapy 06/29/19-07/29/19   Right breast IMRT- Dr. Sondra Come   Hypertension     Patient Active Problem List   Diagnosis Date Noted   Malignant neoplasm of upper-inner quadrant of right breast in female, estrogen receptor positive ([facility]) 04/20/2019   Tobacco dependence 02/08/2019   Unspecified lump in the right breast, upper inner quadrant  02/08/2019   Essential hypertension 02/08/2019   Tachycardia 02/08/2019    Past Surgical History:  Procedure Laterality Date   BREAST LUMPECTOMY WITH SENTINEL LYMPH NODE BIOPSY Right 05/17/2019   Procedure: RIGHT BREAST LUMPECTOMY WITH SENTINEL LYMPH NODE BIOPSY;  Surgeon: Coralie Keens, MD;  Location: [facility];  Service: General;  Laterality: Right;   NO PAST SURGERIES      OB History   No obstetric history on file.      Home Medications    Prior to Admission medications   Medication Sig Start Date End Date Taking? Authorizing Provider  acetaminophen (TYLENOL) 325 MG tablet Take  650 mg by mouth as needed.   Yes [provider]  hydrochlorothiazide (MICROZIDE) 12.5 MG capsule Take 1 capsule (12.5 mg total) by mouth daily. 12/05/20  Yes Dorna Mai, MD  ibuprofen (ADVIL) 600 MG tablet Take 1 tablet (600 mg total) by mouth every 6 (six) hours as needed. 05/21/20  Yes Wieters, Hallie C, PA-C  nicotine (NICODERM CQ - DOSED IN MG/24 HR) 7 mg/24hr patch Place 1 patch (7 mg total) onto the skin daily. 04/30/20  Yes Nicolette Bang, MD  predniSONE (DELTASONE) 20 MG tablet Take 2 tablets (40 mg total) by mouth daily with breakfast for 5 days. 12/12/20 12/17/20 Yes Francene Finders, PA-C  tiZANidine (ZANAFLEX) 4 MG tablet Take 1 tablet (4 mg total) by mouth every 6 (six) hours as needed for muscle spasms. 12/12/20  Yes Francene Finders, PA-C  traMADol (ULTRAM) 50 MG tablet Take 1 tablet (50 mg total) by mouth every 6 (six) hours as needed for severe pain. 05/21/20   Wieters, Elesa Hacker, PA-C    Family History Family History  Problem Relation Age of Onset   Alzheimer's disease Mother    Heart disease Father    Alcohol abuse Father     Social History Social History   Tobacco Use   Smoking status: Every Day    Packs/day: 0.33    Years: 30.00    Pack years:  9.90    Types: Cigarettes   Smokeless tobacco: Never  Vaping Use   Vaping Use: Never used  Substance Use Topics   Alcohol use: Yes    Comment: occasionally a glass wine   Drug use: Never     Allergies   Patient has no known allergies.   Review of Systems Review of Systems  Constitutional:  Negative for chills and fever.  Eyes:  Negative for discharge and redness.  Respiratory:  Negative for shortness of breath.   Gastrointestinal:  Negative for abdominal pain, nausea and vomiting.  Genitourinary:  Positive for vaginal bleeding and vaginal discharge.  Musculoskeletal:  Positive for back pain, myalgias and neck pain.  Neurological:  Negative for numbness.    Physical Exam Triage Vital  Signs ED Triage Vitals  Enc Vitals Group     BP 12/12/20 1154 133/72     Pulse Rate 12/12/20 1154 (!) 102     Resp --      Temp 12/12/20 1154 98 F (36.7 C)     Temp Source 12/12/20 1154 Oral     SpO2 12/12/20 1154 97 %     Weight 12/12/20 1155 116 lb (52.6 kg)     Height 12/12/20 1155 5' (1.524 m)     Head Circumference --      Peak Flow --      Pain Score 12/12/20 1155 10     Pain Loc --      Pain Edu? --      Excl. in [place]? --    No data found.  Updated Vital Signs BP 133/72 (BP Location: Left Arm)   Pulse (!) 102   Temp 98 F (36.7 C) (Oral)   Ht 5' (1.524 m)   Wt 116 lb (52.6 kg)   SpO2 97%   BMI 22.65 kg/m      Physical Exam Vitals and nursing note reviewed.  Constitutional:      General: She is not in acute distress.    Appearance: Normal appearance. She is not ill-appearing.  HENT:     Head: Normocephalic and atraumatic.  Eyes:     Conjunctiva/sclera: Conjunctivae normal.  Cardiovascular:     Rate and Rhythm: Normal rate.  Pulmonary:     Effort: Pulmonary effort is normal.  Musculoskeletal:     Comments: Mild TTP to right trap diffusely  Neurological:     Mental Status: She is alert.  Psychiatric:        Mood and Affect: Mood normal.        Behavior: Behavior normal.        Thought Content: Thought content normal.     UC Treatments / Results  Labs (all labs ordered are listed, but only abnormal results are displayed) Labs Reviewed - No data to display  EKG   Radiology No results found.  Procedures Procedures (including critical care time)  Medications Ordered in UC Medications - No data to display  Initial Impression / Assessment and Plan / UC Course  I have reviewed the triage vital signs and the nursing notes.  Pertinent labs & imaging results that were available during my care of the patient were reviewed by me and considered in my medical decision making (see chart for details).   Suspect likely muscular strain- will treat with  steroid burst and muscle relaxers. Advised that muscle relaxer may cause drowsiness and advised to use with caution. Encouraged follow up if no improvement or if symptoms worsen in any way.  Final Clinical Impressions(s) / UC Diagnoses   Final diagnoses:  Strain of right trapezius muscle, initial encounter   Discharge Instructions   None    ED Prescriptions     Medication Sig Dispense Auth. Provider   predniSONE (DELTASONE) 20 MG tablet Take 2 tablets (40 mg total) by mouth daily with breakfast for 5 days. 10 tablet Ewell Poe F, PA-C   tiZANidine (ZANAFLEX) 4 MG tablet Take 1 tablet (4 mg total) by mouth every 6 (six) hours as needed for muscle spasms. 30 tablet Francene Finders, PA-C      PDMP not reviewed this encounter.   Francene Finders, PA-C 12/12/20 1254

## 2021-03-22 ENCOUNTER — Ambulatory Visit: Payer: Self-pay

## 2021-03-22 NOTE — Telephone Encounter (Signed)
°  Chief Complaint: Fast HR - BP too high Symptoms: Bps today  Morning  - 142/86 HR 88 4pm - 128/93 HR 107 5:30pm  - 120/82 HR 109 Frequency: For awhile pt has felt "off" Pertinent Negatives: Patient denies pain, SOB. Disposition: [] ED /[] Urgent Care (no appt availability in office) / [] Appointment(In office/virtual)/ []  [facility]/ [] Home Care/ [] Refused Recommended Disposition /[] [facility]/ [x]  Follow-up with PCP Additional Notes: Pt called regarding BP and HR. Pt is in no distress.    Pt states that she feels "off" and needs to be seen by a provider. Pt states she called [facility] , but call was not returned.   Pt will call again on Monday for an office visit. Pt will go to UC or ED if needed.  Answer Assessment - Initial Assessment Questions 1. DESCRIPTION: "Please describe your heart rate or heartbeat that you are having" (e.g., fast/slow, regular/irregular, skipped or extra beats, "palpitations")     Seems fast to pt 2. ONSET: "When did it start?" (Minutes, hours or days)      unsure 3. DURATION: "How long does it last" (e.g., seconds, minutes, hours)     All day 4. PATTERN "Does it come and go, or has it been constant since it started?"  "Does it get worse with exertion?"   "Are you feeling it now?"     Pt feels fine. Saw HR when taking BP 5. TAP: "Using your hand, can you tap out what you are feeling on a chair or table in front of you, so that I can hear?" (Note: not all patients can do this)       HR per BP machine is 107, 109 6. HEART RATE: "Can you tell me your heart rate?" "How many beats in 15 seconds?"  (Note: not all patients can do this)        7. RECURRENT SYMPTOM: "Have you ever had this before?" If Yes, ask: "When was the last time?" and "What happened that time?"       8. CAUSE: "What do you think is causing the palpitations?"     Pt feels BP medication is causing this 9. CARDIAC HISTORY: "Do you have any history of heart disease?"  (e.g., heart attack, angina, bypass surgery, angioplasty, arrhythmia)      no 10. OTHER SYMPTOMS: "Do you have any other symptoms?" (e.g., dizziness, chest pain, sweating, difficulty breathing)       no 11. PREGNANCY: "Is there any chance you are pregnant?" "When was your last menstrual period?"       no  Protocols used: Heart Rate and Heartbeat Questions-A-AH

## 2021-03-22 NOTE — Telephone Encounter (Signed)
Duplicate encounter

## 2021-03-25 ENCOUNTER — Ambulatory Visit: Payer: Self-pay | Admitting: *Deleted

## 2021-03-25 NOTE — Telephone Encounter (Signed)
°  Chief Complaint: elevated BP and heart rate. Requesting medication adjustment Symptoms: lightheaded, feeling tired. BP 176/113 HR 108 after 5 minutes 12:42 pm BP 160/104 HR 104.  Frequency: today  Pertinent Negatives: Patient denies chest pain , difficulty breathing, no dizziness now , weakness on either side of body, no N/T.  Disposition: [x] ED /[] Urgent Care (no appt availability in office) / [] Appointment(In office/virtual)/ []  [facility]/ [] Home Care/ [x] Refused Recommended Disposition /[] [facility]/ []  Follow-up with PCP Additional Notes:   Requesting a call back from PCP for medication management. Patient reports PCP was going to increase dose of medication if BP elevated. No call back from PCP yet. Instructed patient if symptoms worsen to go to ED or call 911. Patient report she will if PCP does not call back. Recommended increase water intake and rest. Recheck BP if dizziness or lightheadedness returns.       Reason for Disposition  [2] Systolic BP  >= 694 OR Diastolic >= 854 AND [6] cardiac or neurologic symptoms (e.g., chest pain, difficulty breathing, unsteady gait, blurred vision)  Answer Assessment - Initial Assessment Questions 1. BLOOD PRESSURE: "What is the blood pressure?" "Did you take at least two measurements 5 minutes apart?"     BP 176/113 Hr 99  12:42 pm BP 160 / 104 HR 104 2. ONSET: "When did you take your blood pressure?"     Now  3. HOW: "How did you obtain the blood pressure?" (e.g., visiting nurse, automatic home BP monitor)     Automatic home BP monitor  4. HISTORY: "Do you have a history of high blood pressure?"     Yes  5. MEDICATIONS: "Are you taking any medications for blood pressure?" "Have you missed any doses recently?"     Yes taking Hydrochlorothiazide ( microzide) 12.5 mg  6. OTHER SYMPTOMS: "Do you have any symptoms?" (e.g., headache, chest pain, blurred vision, difficulty breathing, weakness)     Lightheaded. Tired feeling   7. PREGNANCY: "Is there any chance you are pregnant?" "When was your last menstrual period?"     na  Protocols used: Blood Pressure - High-A-AH

## 2021-03-26 ENCOUNTER — Other Ambulatory Visit: Payer: Self-pay

## 2021-03-26 ENCOUNTER — Encounter: Payer: Self-pay | Admitting: Nurse Practitioner

## 2021-03-26 ENCOUNTER — Ambulatory Visit (INDEPENDENT_AMBULATORY_CARE_PROVIDER_SITE_OTHER): Payer: Medicare Other | Admitting: Nurse Practitioner

## 2021-03-26 VITALS — BP 156/98 | HR 95 | Resp 16 | Ht 60.0 in | Wt 116.1 lb

## 2021-03-26 DIAGNOSIS — I10 Essential (primary) hypertension: Secondary | ICD-10-CM | POA: Diagnosis not present

## 2021-03-26 NOTE — Progress Notes (Signed)
Subjective:    Patient here for follow-up of elevated blood pressure.  She is not exercising and is adherent to a low-salt diet.  Blood pressure is not well controlled at home. Cardiac symptoms: none. Patient denies: chest pain, chest pressure/discomfort, dyspnea, irregular heart beat, and palpitations. Cardiovascular risk factors: advanced age (older than 32 for men, 66 for women) and hypertension. Use of agents associated with hypertension: none. History of target organ damage: none.    Review of Systems Review of Systems  Constitutional: Negative.   HENT: Negative.    Eyes: Negative.   Respiratory: Negative.    Cardiovascular: Negative.   Gastrointestinal: Negative.   Genitourinary: Negative.   Musculoskeletal: Negative.   Skin: Negative.   Neurological: Negative.   Endo/Heme/Allergies: Negative.   Psychiatric/Behavioral: Negative.        Objective:   Vitals:   03/26/21 1324  BP: (!) 156/98  Pulse: 95  Resp: 16  SpO2: 95%     Physical Exam Constitutional:      General: She is not in acute distress. Cardiovascular:     Rate and Rhythm: Normal rate and regular rhythm.  Pulmonary:     Effort: Pulmonary effort is normal.     Breath sounds: Normal breath sounds.  Skin:    General: Skin is warm and dry.  Neurological:     Mental Status: She is alert and oriented to person, place, and time.  Psychiatric:        Mood and Affect: Affect normal.      Assessment:    Essential hypertension   Plan:    Medication: increase to 2 capsules daily. Dietary sodium restriction. Regular aerobic exercise. Follow up: 2 weeks and as needed.   Patient Instructions  1. Essential hypertension  Slightly elevated blood pressure and heart rate readings. Increase medication to 2 capsules daily. Keep BP log.  - hydrochlorothiazide (MICROZIDE) 12.5 MG capsule; Take 2 capsule (25 mg total) by mouth daily.    Low salt diet  Stay active  Follow up:  Follow up with Dr Redmond Pulling  in 2 weeks or sooner if needed  How to Take Your Blood Pressure Blood pressure measures how strongly your blood is pressing against the walls of your arteries. Arteries are blood vessels that carry blood from your heart throughout your body. You can take your blood pressure at home with a machine. You may need to check your blood pressure at home: To check if you have high blood pressure (hypertension). To check your blood pressure over time. To make sure your blood pressure medicine is working. Supplies needed: Blood pressure machine, or monitor. Dining room chair to sit in. Table or desk. Small notebook. Pencil or pen. How to prepare Avoid these things for 30 minutes before checking your blood pressure: Having drinks with caffeine in them, such as coffee or tea. Drinking alcohol. Eating. Smoking. Exercising. Do these things five minutes before checking your blood pressure: Go to the bathroom and pee (urinate). Sit in a dining chair. Do not sit on a soft couch or an armchair. Be quiet. Do not talk. How to take your blood pressure Follow the instructions that came with your machine. If you have a digital blood pressure monitor, these may be the instructions: Sit up straight. Place your feet on the floor. Do not cross your ankles or legs. Rest your left arm at the level of your heart. You may rest it on a table, desk, or chair. Pull up your shirt sleeve. Wrap the  blood pressure cuff around the upper part of your left arm. The cuff should be 1 inch (2.5 cm) above your elbow. It is best to wrap the cuff around bare skin. Fit the cuff snugly around your arm. You should be able to place only one finger between the cuff and your arm. Place the cord so that it rests in the bend of your elbow. Press the power button. Sit quietly while the cuff fills with air and loses air. Write down the numbers on the screen. Wait 2-3 minutes and then repeat steps 1-10. What do the numbers mean? Two  numbers make up your blood pressure. The first number is called systolic pressure. The second is called diastolic pressure. An example of a blood pressure reading is "120 over 80" (or 120/80). If you are an adult and do not have a medical condition, use this guide to find out if your blood pressure is normal: Normal First number: below 120. Second number: below 80. Elevated First number: 120-129. Second number: below 80. Hypertension stage 1 First number: 130-139. Second number: 80-89. Hypertension stage 2 First number: 140 or above. Second number: 39 or above. Your blood pressure is above normal even if only the first or only the second number is above normal. Follow these instructions at home: Medicines Take over-the-counter and prescription medicines only as told by your doctor. Tell your doctor if your medicine is causing side effects. General instructions Check your blood pressure as often as your doctor tells you to. Check your blood pressure at the same time every day. Take your monitor to your next doctor's appointment. Your doctor will: Make sure you are using it correctly. Make sure it is working right. Understand what your blood pressure numbers should be. Keep all follow-up visits as told by your doctor. This is important. General tips You will need a blood pressure machine, or monitor. Your doctor can suggest a monitor. You can buy one at a drugstore or online. When choosing one: Choose one with an arm cuff. Choose one that wraps around your upper arm. Only one finger should fit between your arm and the cuff. Do not choose one that measures your blood pressure from your wrist or finger. Where to find more information American Heart Association: www.heart.org Contact a doctor if: Your blood pressure keeps being high. Your blood pressure is suddenly low. Get help right away if: Your first blood pressure number is higher than 180. Your second blood pressure number is  higher than 120. Summary Check your blood pressure at the same time every day. Avoid caffeine, alcohol, smoking, and exercise for 30 minutes before checking your blood pressure. Make sure you understand what your blood pressure numbers should be. This information is not intended to replace advice given to you by your health care provider. Make sure you discuss any questions you have with your health care provider. Document Revised: 11/23/2019 Document Reviewed: 01/07/2019 Elsevier Patient Education  2022 [place] Your Hypertension Hypertension, also called high blood pressure, is when the force of the blood pressing against the walls of the arteries is too strong. Arteries are blood vessels that carry blood from your heart throughout your body. Hypertension forces the heart to work harder to pump blood and may cause the arteries to become narrow or stiff. Understanding blood pressure readings Your personal target blood pressure may vary depending on your medical conditions, your age, and other factors. A blood pressure reading includes a higher number over a  lower number. Ideally, your blood pressure should be below 120/80. You should know that: The first, or top, number is called the systolic pressure. It is a measure of the pressure in your arteries as your heart beats. The second, or bottom number, is called the diastolic pressure. It is a measure of the pressure in your arteries as the heart relaxes. Blood pressure is classified into four stages. Based on your blood pressure reading, your health care provider may use the following stages to determine what type of treatment you need, if any. Systolic pressure and diastolic pressure are measured in a unit called mmHg. Normal Systolic pressure: below 408. Diastolic pressure: below 80. Elevated Systolic pressure: 144-818. Diastolic pressure: below 80. Hypertension stage 1 Systolic pressure: 563-149. Diastolic pressure:  70-26. Hypertension stage 2 Systolic pressure: 378 or above. Diastolic pressure: 90 or above. How can this condition affect me? Managing your hypertension is an important responsibility. Over time, hypertension can damage the arteries and decrease blood flow to important parts of the body, including the brain, heart, and kidneys. Having untreated or uncontrolled hypertension can lead to: A heart attack. A stroke. A weakened blood vessel (aneurysm). Heart failure. Kidney damage. Eye damage. Metabolic syndrome. Memory and concentration problems. Vascular dementia. What actions can I take to manage this condition? Hypertension can be managed by making lifestyle changes and possibly by taking medicines. Your health care provider will help you make a plan to bring your blood pressure within a normal range. Nutrition  Eat a diet that is high in fiber and potassium, and low in salt (sodium), added sugar, and fat. An example eating plan is called the Dietary Approaches to Stop Hypertension (DASH) diet. To eat this way: Eat plenty of fresh fruits and vegetables. Try to fill one-half of your plate at each meal with fruits and vegetables. Eat whole grains, such as whole-wheat pasta, brown rice, or whole-grain bread. Fill about one-fourth of your plate with whole grains. Eat low-fat dairy products. Avoid fatty cuts of meat, processed or cured meats, and poultry with skin. Fill about one-fourth of your plate with lean proteins such as fish, chicken without skin, beans, eggs, and tofu. Avoid pre-made and processed foods. These tend to be higher in sodium, added sugar, and fat. Reduce your daily sodium intake. Most people with hypertension should eat less than 1,500 mg of sodium a day. Lifestyle  Work with your health care provider to maintain a healthy body weight or to lose weight. Ask what an ideal weight is for you. Get at least 30 minutes of exercise that causes your heart to beat faster (aerobic  exercise) most days of the week. Activities may include walking, swimming, or biking. Include exercise to strengthen your muscles (resistance exercise), such as weight lifting, as part of your weekly exercise routine. Try to do these types of exercises for 30 minutes at least 3 days a week. Do not use any products that contain nicotine or tobacco, such as cigarettes, e-cigarettes, and chewing tobacco. If you need help quitting, ask your health care provider. Control any long-term (chronic) conditions you have, such as high cholesterol or diabetes. Identify your sources of stress and find ways to manage stress. This may include meditation, deep breathing, or making time for fun activities. Alcohol use Do not drink alcohol if: Your health care provider tells you not to drink. You are pregnant, may be pregnant, or are planning to become pregnant. If you drink alcohol: Limit how much you use to: 0-1 drink  a day for women. 0-2 drinks a day for men. Be aware of how much alcohol is in your drink. In the [place] one drink equals one 12 oz bottle of beer (355 mL), one 5 oz glass of wine (148 mL), or one 1 oz glass of hard liquor (44 mL). Medicines Your health care provider may prescribe medicine if lifestyle changes are not enough to get your blood pressure under control and if: Your systolic blood pressure is 130 or higher. Your diastolic blood pressure is 80 or higher. Take medicines only as told by your health care provider. Follow the directions carefully. Blood pressure medicines must be taken as told by your health care provider. The medicine does not work as well when you skip doses. Skipping doses also puts you at risk for problems. Monitoring Before you monitor your blood pressure: Do not smoke, drink caffeinated beverages, or exercise within 30 minutes before taking a measurement. Use the bathroom and empty your bladder (urinate). Sit quietly for at least 5 minutes before taking  measurements. Monitor your blood pressure at home as told by your health care provider. To do this: Sit with your back straight and supported. Place your feet flat on the floor. Do not cross your legs. Support your arm on a flat surface, such as a table. Make sure your upper arm is at heart level. Each time you measure, take two or three readings one minute apart and record the results. You may also need to have your blood pressure checked regularly by your health care provider. General information Talk with your health care provider about your diet, exercise habits, and other lifestyle factors that may be contributing to hypertension. Review all the medicines you take with your health care provider because there may be side effects or interactions. Keep all visits as told by your health care provider. Your health care provider can help you create and adjust your plan for managing your high blood pressure. Where to find more information National Heart, Lung, and Blood Institute: https://wilson-eaton.com/ American Heart Association: www.heart.org Contact a health care provider if: You think you are having a reaction to medicines you have taken. You have repeated (recurrent) headaches. You feel dizzy. You have swelling in your ankles. You have trouble with your vision. Get help right away if: You develop a severe headache or confusion. You have unusual weakness or numbness, or you feel faint. You have severe pain in your chest or abdomen. You vomit repeatedly. You have trouble breathing. These symptoms may represent a serious problem that is an emergency. Do not wait to see if the symptoms will go away. Get medical help right away. Call your local emergency services (911 in the [place] Do not drive yourself to the hospital. Summary Hypertension is when the force of blood pumping through your arteries is too strong. If this condition is not controlled, it may put you at risk for serious  complications. Your personal target blood pressure may vary depending on your medical conditions, your age, and other factors. For most people, a normal blood pressure is less than 120/80. Hypertension is managed by lifestyle changes, medicines, or both. Lifestyle changes to help manage hypertension include losing weight, eating a healthy, low-sodium diet, exercising more, stopping smoking, and limiting alcohol. This information is not intended to replace advice given to you by your health care provider. Make sure you discuss any questions you have with your health care provider. Document Revised: 01/31/2019 Document Reviewed: 12/14/2018 Elsevier Patient Education  2022  [employer]   Blood Pressure Record Sheet To take your blood pressure, you will need a blood pressure machine. You can buy a blood pressure machine (blood pressure monitor) at your clinic, drug store, or online. When choosing one, consider: An automatic monitor that has an arm cuff. A cuff that wraps snugly around your upper arm. You should be able to fit only one finger between your arm and the cuff. A device that stores blood pressure reading results. Do not choose a monitor that measures your blood pressure from your wrist or finger. Follow your health care provider's instructions for how to take your blood pressure. To use this form: Get one reading in the morning (a.m.) before you take any medicines. Get one reading in the evening (p.m.) before supper. Take at least 2 readings with each blood pressure check. This makes sure the results are correct. Wait 1-2 minutes between measurements. Write down the results in the spaces on this form. Repeat this once a week, or as told by your health care provider. Make a follow-up appointment with your health care provider to discuss the results. Blood pressure log Date: _______________________ a.m. _____________________(1st reading) _____________________(2nd reading) p.m.  _____________________(1st reading) _____________________(2nd reading) Date: _______________________ a.m. _____________________(1st reading) _____________________(2nd reading) p.m. _____________________(1st reading) _____________________(2nd reading) Date: _______________________ a.m. _____________________(1st reading) _____________________(2nd reading) p.m. _____________________(1st reading) _____________________(2nd reading) Date: _______________________ a.m. _____________________(1st reading) _____________________(2nd reading) p.m. _____________________(1st reading) _____________________(2nd reading) Date: _______________________ a.m. _____________________(1st reading) _____________________(2nd reading) p.m. _____________________(1st reading) _____________________(2nd reading) This information is not intended to replace advice given to you by your health care provider. Make sure you discuss any questions you have with your health care provider. Document Revised: 05/03/2019 Document Reviewed: 05/04/2019 Elsevier Patient Education  2022 [place], [place] 03/26/21

## 2021-03-26 NOTE — Progress Notes (Signed)
Brought blood pressure reading with her. Concern that she might need a different medication to keep it more even.

## 2021-03-26 NOTE — Patient Instructions (Addendum)
1. Essential hypertension  Slightly elevated blood pressure and heart rate readings. Increase medication to 2 capsules daily. Keep BP log.  - hydrochlorothiazide (MICROZIDE) 12.5 MG capsule; Take 2 capsule (25 mg total) by mouth daily.    Low salt diet  Stay active  Follow up:  Follow up with Dr Redmond Pulling in 2 weeks or sooner if needed  How to Take Your Blood Pressure Blood pressure measures how strongly your blood is pressing against the walls of your arteries. Arteries are blood vessels that carry blood from your heart throughout your body. You can take your blood pressure at home with a machine. You may need to check your blood pressure at home: To check if you have high blood pressure (hypertension). To check your blood pressure over time. To make sure your blood pressure medicine is working. Supplies needed: Blood pressure machine, or monitor. Dining room chair to sit in. Table or desk. Small notebook. Pencil or pen. How to prepare Avoid these things for 30 minutes before checking your blood pressure: Having drinks with caffeine in them, such as coffee or tea. Drinking alcohol. Eating. Smoking. Exercising. Do these things five minutes before checking your blood pressure: Go to the bathroom and pee (urinate). Sit in a dining chair. Do not sit on a soft couch or an armchair. Be quiet. Do not talk. How to take your blood pressure Follow the instructions that came with your machine. If you have a digital blood pressure monitor, these may be the instructions: Sit up straight. Place your feet on the floor. Do not cross your ankles or legs. Rest your left arm at the level of your heart. You may rest it on a table, desk, or chair. Pull up your shirt sleeve. Wrap the blood pressure cuff around the upper part of your left arm. The cuff should be 1 inch (2.5 cm) above your elbow. It is best to wrap the cuff around bare skin. Fit the cuff snugly around your arm. You should be able  to place only one finger between the cuff and your arm. Place the cord so that it rests in the bend of your elbow. Press the power button. Sit quietly while the cuff fills with air and loses air. Write down the numbers on the screen. Wait 2-3 minutes and then repeat steps 1-10. What do the numbers mean? Two numbers make up your blood pressure. The first number is called systolic pressure. The second is called diastolic pressure. An example of a blood pressure reading is "120 over 80" (or 120/80). If you are an adult and do not have a medical condition, use this guide to find out if your blood pressure is normal: Normal First number: below 120. Second number: below 80. Elevated First number: 120-129. Second number: below 80. Hypertension stage 1 First number: 130-139. Second number: 80-89. Hypertension stage 2 First number: 140 or above. Second number: 82 or above. Your blood pressure is above normal even if only the first or only the second number is above normal. Follow these instructions at home: Medicines Take over-the-counter and prescription medicines only as told by your doctor. Tell your doctor if your medicine is causing side effects. General instructions Check your blood pressure as often as your doctor tells you to. Check your blood pressure at the same time every day. Take your monitor to your next doctor's appointment. Your doctor will: Make sure you are using it correctly. Make sure it is working right. Understand what your blood pressure numbers  should be. Keep all follow-up visits as told by your doctor. This is important. General tips You will need a blood pressure machine, or monitor. Your doctor can suggest a monitor. You can buy one at a drugstore or online. When choosing one: Choose one with an arm cuff. Choose one that wraps around your upper arm. Only one finger should fit between your arm and the cuff. Do not choose one that measures your blood pressure  from your wrist or finger. Where to find more information American Heart Association: www.heart.org Contact a doctor if: Your blood pressure keeps being high. Your blood pressure is suddenly low. Get help right away if: Your first blood pressure number is higher than 180. Your second blood pressure number is higher than 120. Summary Check your blood pressure at the same time every day. Avoid caffeine, alcohol, smoking, and exercise for 30 minutes before checking your blood pressure. Make sure you understand what your blood pressure numbers should be. This information is not intended to replace advice given to you by your health care provider. Make sure you discuss any questions you have with your health care provider. Document Revised: 11/23/2019 Document Reviewed: 01/07/2019 Elsevier Patient Education  2022 [place] Your Hypertension Hypertension, also called high blood pressure, is when the force of the blood pressing against the walls of the arteries is too strong. Arteries are blood vessels that carry blood from your heart throughout your body. Hypertension forces the heart to work harder to pump blood and may cause the arteries to become narrow or stiff. Understanding blood pressure readings Your personal target blood pressure may vary depending on your medical conditions, your age, and other factors. A blood pressure reading includes a higher number over a lower number. Ideally, your blood pressure should be below 120/80. You should know that: The first, or top, number is called the systolic pressure. It is a measure of the pressure in your arteries as your heart beats. The second, or bottom number, is called the diastolic pressure. It is a measure of the pressure in your arteries as the heart relaxes. Blood pressure is classified into four stages. Based on your blood pressure reading, your health care provider may use the following stages to determine what type of  treatment you need, if any. Systolic pressure and diastolic pressure are measured in a unit called mmHg. Normal Systolic pressure: below 829. Diastolic pressure: below 80. Elevated Systolic pressure: 562-130. Diastolic pressure: below 80. Hypertension stage 1 Systolic pressure: 865-784. Diastolic pressure: 69-62. Hypertension stage 2 Systolic pressure: 952 or above. Diastolic pressure: 90 or above. How can this condition affect me? Managing your hypertension is an important responsibility. Over time, hypertension can damage the arteries and decrease blood flow to important parts of the body, including the brain, heart, and kidneys. Having untreated or uncontrolled hypertension can lead to: A heart attack. A stroke. A weakened blood vessel (aneurysm). Heart failure. Kidney damage. Eye damage. Metabolic syndrome. Memory and concentration problems. Vascular dementia. What actions can I take to manage this condition? Hypertension can be managed by making lifestyle changes and possibly by taking medicines. Your health care provider will help you make a plan to bring your blood pressure within a normal range. Nutrition  Eat a diet that is high in fiber and potassium, and low in salt (sodium), added sugar, and fat. An example eating plan is called the Dietary Approaches to Stop Hypertension (DASH) diet. To eat this way: Eat plenty of fresh fruits  and vegetables. Try to fill one-half of your plate at each meal with fruits and vegetables. Eat whole grains, such as whole-wheat pasta, brown rice, or whole-grain bread. Fill about one-fourth of your plate with whole grains. Eat low-fat dairy products. Avoid fatty cuts of meat, processed or cured meats, and poultry with skin. Fill about one-fourth of your plate with lean proteins such as fish, chicken without skin, beans, eggs, and tofu. Avoid pre-made and processed foods. These tend to be higher in sodium, added sugar, and fat. Reduce your daily  sodium intake. Most people with hypertension should eat less than 1,500 mg of sodium a day. Lifestyle  Work with your health care provider to maintain a healthy body weight or to lose weight. Ask what an ideal weight is for you. Get at least 30 minutes of exercise that causes your heart to beat faster (aerobic exercise) most days of the week. Activities may include walking, swimming, or biking. Include exercise to strengthen your muscles (resistance exercise), such as weight lifting, as part of your weekly exercise routine. Try to do these types of exercises for 30 minutes at least 3 days a week. Do not use any products that contain nicotine or tobacco, such as cigarettes, e-cigarettes, and chewing tobacco. If you need help quitting, ask your health care provider. Control any long-term (chronic) conditions you have, such as high cholesterol or diabetes. Identify your sources of stress and find ways to manage stress. This may include meditation, deep breathing, or making time for fun activities. Alcohol use Do not drink alcohol if: Your health care provider tells you not to drink. You are pregnant, may be pregnant, or are planning to become pregnant. If you drink alcohol: Limit how much you use to: 0-1 drink a day for women. 0-2 drinks a day for men. Be aware of how much alcohol is in your drink. In the [place] one drink equals one 12 oz bottle of beer (355 mL), one 5 oz glass of wine (148 mL), or one 1 oz glass of hard liquor (44 mL). Medicines Your health care provider may prescribe medicine if lifestyle changes are not enough to get your blood pressure under control and if: Your systolic blood pressure is 130 or higher. Your diastolic blood pressure is 80 or higher. Take medicines only as told by your health care provider. Follow the directions carefully. Blood pressure medicines must be taken as told by your health care provider. The medicine does not work as well when you skip doses.  Skipping doses also puts you at risk for problems. Monitoring Before you monitor your blood pressure: Do not smoke, drink caffeinated beverages, or exercise within 30 minutes before taking a measurement. Use the bathroom and empty your bladder (urinate). Sit quietly for at least 5 minutes before taking measurements. Monitor your blood pressure at home as told by your health care provider. To do this: Sit with your back straight and supported. Place your feet flat on the floor. Do not cross your legs. Support your arm on a flat surface, such as a table. Make sure your upper arm is at heart level. Each time you measure, take two or three readings one minute apart and record the results. You may also need to have your blood pressure checked regularly by your health care provider. General information Talk with your health care provider about your diet, exercise habits, and other lifestyle factors that may be contributing to hypertension. Review all the medicines you take with your health care  provider because there may be side effects or interactions. Keep all visits as told by your health care provider. Your health care provider can help you create and adjust your plan for managing your high blood pressure. Where to find more information National Heart, Lung, and Blood Institute: https://wilson-eaton.com/ American Heart Association: www.heart.org Contact a health care provider if: You think you are having a reaction to medicines you have taken. You have repeated (recurrent) headaches. You feel dizzy. You have swelling in your ankles. You have trouble with your vision. Get help right away if: You develop a severe headache or confusion. You have unusual weakness or numbness, or you feel faint. You have severe pain in your chest or abdomen. You vomit repeatedly. You have trouble breathing. These symptoms may represent a serious problem that is an emergency. Do not wait to see if the symptoms will go  away. Get medical help right away. Call your local emergency services (911 in the [place] Do not drive yourself to the hospital. Summary Hypertension is when the force of blood pumping through your arteries is too strong. If this condition is not controlled, it may put you at risk for serious complications. Your personal target blood pressure may vary depending on your medical conditions, your age, and other factors. For most people, a normal blood pressure is less than 120/80. Hypertension is managed by lifestyle changes, medicines, or both. Lifestyle changes to help manage hypertension include losing weight, eating a healthy, low-sodium diet, exercising more, stopping smoking, and limiting alcohol. This information is not intended to replace advice given to you by your health care provider. Make sure you discuss any questions you have with your health care provider. Document Revised: 01/31/2019 Document Reviewed: 12/14/2018 Elsevier Patient Education  2022 [place].   Blood Pressure Record Sheet To take your blood pressure, you will need a blood pressure machine. You can buy a blood pressure machine (blood pressure monitor) at your clinic, drug store, or online. When choosing one, consider: An automatic monitor that has an arm cuff. A cuff that wraps snugly around your upper arm. You should be able to fit only one finger between your arm and the cuff. A device that stores blood pressure reading results. Do not choose a monitor that measures your blood pressure from your wrist or finger. Follow your health care provider's instructions for how to take your blood pressure. To use this form: Get one reading in the morning (a.m.) before you take any medicines. Get one reading in the evening (p.m.) before supper. Take at least 2 readings with each blood pressure check. This makes sure the results are correct. Wait 1-2 minutes between measurements. Write down the results in the spaces on this  form. Repeat this once a week, or as told by your health care provider. Make a follow-up appointment with your health care provider to discuss the results. Blood pressure log Date: _______________________ a.m. _____________________(1st reading) _____________________(2nd reading) p.m. _____________________(1st reading) _____________________(2nd reading) Date: _______________________ a.m. _____________________(1st reading) _____________________(2nd reading) p.m. _____________________(1st reading) _____________________(2nd reading) Date: _______________________ a.m. _____________________(1st reading) _____________________(2nd reading) p.m. _____________________(1st reading) _____________________(2nd reading) Date: _______________________ a.m. _____________________(1st reading) _____________________(2nd reading) p.m. _____________________(1st reading) _____________________(2nd reading) Date: _______________________ a.m. _____________________(1st reading) _____________________(2nd reading) p.m. _____________________(1st reading) _____________________(2nd reading) This information is not intended to replace advice given to you by your health care provider. Make sure you discuss any questions you have with your health care provider. Document Revised: 05/03/2019 Document Reviewed: 05/04/2019 Elsevier Patient Education  [place].

## 2021-04-11 ENCOUNTER — Other Ambulatory Visit: Payer: Self-pay

## 2021-04-11 ENCOUNTER — Ambulatory Visit (INDEPENDENT_AMBULATORY_CARE_PROVIDER_SITE_OTHER): Payer: Medicare Other | Admitting: Family Medicine

## 2021-04-11 ENCOUNTER — Encounter: Payer: Self-pay | Admitting: Family Medicine

## 2021-04-11 VITALS — BP 162/93 | HR 101 | Temp 98.0°F | Resp 16 | Wt 116.0 lb

## 2021-04-11 DIAGNOSIS — F17200 Nicotine dependence, unspecified, uncomplicated: Secondary | ICD-10-CM

## 2021-04-11 DIAGNOSIS — I10 Essential (primary) hypertension: Secondary | ICD-10-CM

## 2021-04-11 DIAGNOSIS — F411 Generalized anxiety disorder: Secondary | ICD-10-CM

## 2021-04-11 MED ORDER — HYDROCHLOROTHIAZIDE 25 MG PO TABS: 25.0000 mg | ORAL_TABLET | Freq: Every day | ORAL | 1 refills | Status: DC

## 2021-04-11 NOTE — Progress Notes (Signed)
? ?Established ? Patient Office Visit ? ?Subjective:  ?Patient ID: Barbara Green, female    DOB: [birth year]  Age: 81 y.o. MRN: [id number] ? ?CC:  ?Chief Complaint  ?Patient presents with  ? Follow-up  ? ? ?HPI ?Barbara Green presents for follow up of hypertension. Patient reports that she has had no issues with the increased med. She has been taking her bp readings at home and they have been lower that what is noted in the office today.  ? ?Past Medical History:  ?Diagnosis Date  ? History of radiation therapy 06/29/19-07/29/19  ? Right breast IMRT- Dr. Sondra Come  ? Hypertension   ? ? ?Past Surgical History:  ?Procedure Laterality Date  ? BREAST LUMPECTOMY WITH SENTINEL LYMPH NODE BIOPSY Right 05/17/2019  ? Procedure: RIGHT BREAST LUMPECTOMY WITH SENTINEL LYMPH NODE BIOPSY;  Surgeon: Coralie Keens, MD;  Location: [facility];  Service: General;  Laterality: Right;  ? NO PAST SURGERIES    ? ? ?Family History  ?Problem Relation Age of Onset  ? Alzheimer's disease Mother   ? Heart disease Father   ? Alcohol abuse Father   ? ? ?Social History  ? ?Socioeconomic History  ? Marital status: Married  ?  Spouse name: Not on file  ? Number of children: 3  ? Years of education: Not on file  ? Highest education level: 12th grade  ?Occupational History  ? Not on file  ?Tobacco Use  ? Smoking status: Every Day  ?  Packs/day: 0.33  ?  Years: 30.00  ?  Pack years: 9.90  ?  Types: Cigarettes  ? Smokeless tobacco: Never  ?Vaping Use  ? Vaping Use: Never used  ?Substance and Sexual Activity  ? Alcohol use: Yes  ?  Comment: occasionally a glass wine  ? Drug use: Never  ? Sexual activity: Not on file  ?Other Topics Concern  ? Not on file  ?Social History Narrative  ? Not on file  ? ?Social Determinants of Health  ? ?Financial Resource Strain: Not on file  ?Food Insecurity: Not on file  ?Transportation Needs: Not on file  ?Physical Activity: Not on file  ?Stress: Not on file  ?Social Connections: Not on file  ?Intimate Partner  Violence: Not on file  ? ? ?ROS ?Review of Systems  ?All other systems reviewed and are negative. ? ?Objective:  ? ?Today's Vitals: BP (!) 162/93   Pulse (!) 101   Temp 98 ?F (36.7 ?C) (Oral)   Resp 16   Wt 116 lb (52.6 kg)   SpO2 96%   BMI 22.65 kg/m?  ? ?Physical Exam ?Vitals and nursing note reviewed.  ?Constitutional:   ?   General: She is not in acute distress. ?Cardiovascular:  ?   Rate and Rhythm: Normal rate and regular rhythm.  ?Pulmonary:  ?   Effort: Pulmonary effort is normal.  ?   Breath sounds: Normal breath sounds.  ?Abdominal:  ?   Palpations: Abdomen is soft.  ?   Tenderness: There is no abdominal tenderness.  ?Musculoskeletal:  ?   Right lower leg: No edema.  ?   Left lower leg: No edema.  ?Neurological:  ?   General: No focal deficit present.  ?   Mental Status: She is alert and oriented to person, place, and time.  ? ? ?Assessment & Plan:  ? ?1. Essential hypertension ?Improved readings. Continue present management and monitor. Meds refilled ? ?2. Generalized anxiety disorder ?Will monitor ? ?3. Tobacco  dependence ?Discussed reduction/cessation ? ?Outpatient Encounter Medications as of 04/11/2021  ?Medication Sig  ? hydrochlorothiazide (HYDRODIURIL) 25 MG tablet Take 1 tablet (25 mg total) by mouth daily.  ? acetaminophen (TYLENOL) 325 MG tablet Take 650 mg by mouth as needed.  ? ibuprofen (ADVIL) 600 MG tablet Take 1 tablet (600 mg total) by mouth every 6 (six) hours as needed.  ? nicotine (NICODERM CQ - DOSED IN MG/24 HR) 7 mg/24hr patch Place 1 patch (7 mg total) onto the skin daily. (Patient not taking: Reported on 03/26/2021)  ? tiZANidine (ZANAFLEX) 4 MG tablet Take 1 tablet (4 mg total) by mouth every 6 (six) hours as needed for muscle spasms. (Patient not taking: Reported on 03/26/2021)  ? traMADol (ULTRAM) 50 MG tablet Take 1 tablet (50 mg total) by mouth every 6 (six) hours as needed for severe pain. (Patient not taking: Reported on 03/26/2021)  ? [DISCONTINUED] hydrochlorothiazide  (MICROZIDE) 12.5 MG capsule Take 1 capsule (12.5 mg total) by mouth daily.  ? ?No facility-administered encounter medications on file as of 04/11/2021.  ? ? ?Follow-up: No follow-ups on file.  ? ?Becky Sax, MD ? ?

## 2021-04-11 NOTE — Progress Notes (Signed)
Patient is here for a follow-up blood pressure check. Patient said taken her bp at home, it sometime fluctuate  ?

## 2021-05-10 ENCOUNTER — Ambulatory Visit: Payer: Medicare Other

## 2021-05-10 NOTE — Progress Notes (Deleted)
? ?Subjective:  ? Barbara Green is a 81 y.o. female who presents for an Initial Medicare Annual Wellness Visit. ? ?Review of Systems    ?*** ?  ? ?   ?Objective:  ?  ?There were no vitals filed for this visit. ?There is no height or weight on file to calculate BMI. ? ? ?  11/29/2020  ?  9:50 AM 05/14/2020  ?  3:26 PM 12/12/2019  ?  3:11 PM 09/05/2019  ?  3:58 PM 06/16/2019  ?  2:34 PM 05/30/2019  ?  2:12 PM 05/17/2019  ?  1:05 PM  ?Advanced Directives  ?Does Patient Have a Medical Advance Directive? Yes Yes Yes Yes Yes Yes Yes  ?Type of Paramedic of [place];Living will  [place];Living will [place];Living will [place];Living will Living will;Healthcare Power of [place];Living will  ?Does patient want to make changes to medical advance directive? No - Patient declined No - Patient declined No - Patient declined No - Patient declined No - Patient declined  No - Patient declined  ?Copy of [place] in Chart? No - copy requested      No - copy requested  ? ? ?Current Medications (verified) ?Outpatient Encounter Medications as of 05/10/2021  ?Medication Sig  ? acetaminophen (TYLENOL) 325 MG tablet Take 650 mg by mouth as needed.  ? hydrochlorothiazide (HYDRODIURIL) 25 MG tablet Take 1 tablet (25 mg total) by mouth daily.  ? ibuprofen (ADVIL) 600 MG tablet Take 1 tablet (600 mg total) by mouth every 6 (six) hours as needed.  ? nicotine (NICODERM CQ - DOSED IN MG/24 HR) 7 mg/24hr patch Place 1 patch (7 mg total) onto the skin daily. (Patient not taking: Reported on 03/26/2021)  ? tiZANidine (ZANAFLEX) 4 MG tablet Take 1 tablet (4 mg total) by mouth every 6 (six) hours as needed for muscle spasms. (Patient not taking: Reported on 03/26/2021)  ? traMADol (ULTRAM) 50 MG tablet Take 1 tablet (50 mg total) by mouth every 6 (six) hours as needed for severe pain. (Patient not taking: Reported on  03/26/2021)  ? ?No facility-administered encounter medications on file as of 05/10/2021.  ? ? ?Allergies (verified) ?Patient has no known allergies.  ? ?History: ?Past Medical History:  ?Diagnosis Date  ? History of radiation therapy 06/29/19-07/29/19  ? Right breast IMRT- Dr. Sondra Come  ? Hypertension   ? ?Past Surgical History:  ?Procedure Laterality Date  ? BREAST LUMPECTOMY WITH SENTINEL LYMPH NODE BIOPSY Right 05/17/2019  ? Procedure: RIGHT BREAST LUMPECTOMY WITH SENTINEL LYMPH NODE BIOPSY;  Surgeon: Coralie Keens, MD;  Location: [facility];  Service: General;  Laterality: Right;  ? NO PAST SURGERIES    ? ?Family History  ?Problem Relation Age of Onset  ? Alzheimer's disease Mother   ? Heart disease Father   ? Alcohol abuse Father   ? ?Social History  ? ?Socioeconomic History  ? Marital status: Married  ?  Spouse name: Not on file  ? Number of children: 3  ? Years of education: Not on file  ? Highest education level: 12th grade  ?Occupational History  ? Not on file  ?Tobacco Use  ? Smoking status: Every Day  ?  Packs/day: 0.33  ?  Years: 30.00  ?  Pack years: 9.90  ?  Types: Cigarettes  ? Smokeless tobacco: Never  ?Vaping Use  ? Vaping Use: Never used  ?Substance and  Sexual Activity  ? Alcohol use: Yes  ?  Comment: occasionally a glass wine  ? Drug use: Never  ? Sexual activity: Not on file  ?Other Topics Concern  ? Not on file  ?Social History Narrative  ? Not on file  ? ?Social Determinants of Health  ? ?Financial Resource Strain: Not on file  ?Food Insecurity: Not on file  ?Transportation Needs: Not on file  ?Physical Activity: Not on file  ?Stress: Not on file  ?Social Connections: Not on file  ? ? ?Tobacco Counseling ?Ready to quit: Not Answered ?Counseling given: Not Answered ? ? ?Clinical Intake: ? ?  ? ?  ? ?  ? ?  ? ?  ? ?Diabetic?*** ? ?  ? ?  ? ? ?Activities of Daily Living ? ?  12/05/2020  ?  3:21 PM  ?In your present state of health, do you have any difficulty performing the following  activities:  ?Hearing? 0  ?Vision? 0  ?Difficulty concentrating or making decisions? 0  ?Walking or climbing stairs? 0  ?Dressing or bathing? 0  ?Doing errands, shopping? 0  ? ? ?Patient Care Team: ?Dorna Mai, MD as PCP - General (Family Medicine) ?Mauro Kaufmann, RN as Oncology Nurse Navigator ?Rockwell Germany, RN as Medical Oncologist ?Coralie Keens, MD as Consulting Physician (General Surgery) ?Truitt Merle, MD as Consulting Physician (Hematology) ?Gery Pray, MD as Consulting Physician (Radiation Oncology) ?Alla Feeling, NP as Nurse Practitioner (Nurse Practitioner) ? ?Indicate any recent Medical Services you may have received from other than [facility] providers in the past year (date may be approximate). ? ?   ?Assessment:  ? This is a routine wellness examination for Barbara Green. ? ?Hearing/Vision screen ?No results found. ? ?Dietary issues and exercise activities discussed: ?  ? ? Goals Addressed   ?None ?  ?Depression Screen ? ?  04/11/2021  ?  9:43 AM 03/26/2021  ?  1:30 PM 04/30/2020  ?  4:17 PM 10/19/2019  ?  2:17 PM 03/16/2019  ?  1:34 PM  ?PHQ 2/9 Scores  ?PHQ - 2 Score 0 0 0 0 0  ?PHQ- 9 Score 0 1     ?  ?Fall Risk ? ?  04/30/2020  ?  4:19 PM  ?Fall Risk   ?Falls in the past year? 0  ?Number falls in past yr: 0  ?Injury with Fall? 0  ?Follow up Falls evaluation completed  ? ? ?FALL RISK PREVENTION PERTAINING TO THE HOME: ? ?Any stairs in or around the home? {YES/NO:21197} ?If so, are there any without handrails? {YES/NO:21197} ?Home free of loose throw rugs in walkways, pet beds, electrical cords, etc? {YES/NO:21197} ?Adequate lighting in your home to reduce risk of falls? {YES/NO:21197} ? ?ASSISTIVE DEVICES UTILIZED TO PREVENT FALLS: ? ?Life alert? {YES/NO:21197} ?Use of a cane, walker or w/c? {YES/NO:21197} ?Grab bars in the bathroom? {YES/NO:21197} ?Shower chair or bench in shower? {YES/NO:21197} ?Elevated toilet seat or a handicapped toilet? {YES/NO:21197} ? ?TIMED UP AND GO: ? ?Was the test performed?  {YES/NO:21197}.  ?Length of time to ambulate 10 feet: *** sec.  ? ?{Appearance of Gait:2101803} ? ?Cognitive Function: ?  ?  ?  ? ?Immunizations ? ?There is no immunization history on file for this patient. ? ?{TDAP status:2101805} ? ?{Flu Vaccine status:2101806} ? ?{Pneumococcal vaccine status:2101807} ? ?{Covid-19 vaccine status:2101808} ? ?Qualifies for Shingles Vaccine? {YES/NO:21197}  ?Zostavax completed {YES/NO:21197}  ?{Shingrix Completed?:2101804} ? ?Screening Tests ?Health Maintenance  ?Topic Date Due  ? Pneumonia Vaccine 60+ Years old (  1 - PCV) Never done  ? Hepatitis C Screening  Never done  ? TETANUS/TDAP  Never done  ? Zoster Vaccines- Shingrix (1 of 2) Never done  ? DEXA SCAN  Never done  ? INFLUENZA VACCINE  08/27/2021  ? HPV VACCINES  Aged Out  ? COVID-19 Vaccine  Discontinued  ? ? ?Health Maintenance ? ?Health Maintenance Due  ?Topic Date Due  ? Pneumonia Vaccine 1+ Years old (1 - PCV) Never done  ? Hepatitis C Screening  Never done  ? TETANUS/TDAP  Never done  ? Zoster Vaccines- Shingrix (1 of 2) Never done  ? DEXA SCAN  Never done  ? ? ?{Colorectal cancer screening:2101809} ? ?{Mammogram status:21018020} ? ?{Bone Density status:21018021} ? ?Lung Cancer Screening: (Low Dose CT Chest recommended if Age 49-80 years, 30 pack-year currently smoking OR have quit w/in 15years.) {DOES NOT does:27190::"does not"} qualify.  ? ?Lung Cancer Screening Referral: *** ? ?Additional Screening: ? ?Hepatitis C Screening: {DOES NOT does:27190::"does not"} qualify; Completed *** ? ?Vision Screening: Recommended annual ophthalmology exams for early detection of glaucoma and other disorders of the eye. ?Is the patient up to date with their annual eye exam?  {YES/NO:21197} ?Who is the provider or what is the name of the office in which the patient attends annual eye exams? *** ?If pt is not established with a provider, would they like to be referred to a provider to establish care? {YES/NO:21197}.  ? ?Dental Screening:  Recommended annual dental exams for proper oral hygiene ? ?Community Resource Referral / Chronic Care Management: ?CRR required this visit?  {YES/NO:21197} ? ?CCM required this visit?  {YES/NO:21197} ? ? ?  ?Plan

## 2021-05-23 ENCOUNTER — Encounter: Payer: Self-pay | Admitting: Family Medicine

## 2021-05-23 ENCOUNTER — Ambulatory Visit (INDEPENDENT_AMBULATORY_CARE_PROVIDER_SITE_OTHER): Payer: Medicare Other | Admitting: Family Medicine

## 2021-05-23 VITALS — BP 132/84 | HR 107 | Temp 98.0°F | Resp 16 | Wt 114.0 lb

## 2021-05-23 DIAGNOSIS — I10 Essential (primary) hypertension: Secondary | ICD-10-CM

## 2021-05-23 MED ORDER — LISINOPRIL 2.5 MG PO TABS: 2.5000 mg | ORAL_TABLET | Freq: Every day | ORAL | 1 refills | Status: DC

## 2021-05-23 NOTE — Progress Notes (Signed)
Patient is her for follow-up HTN.  ?Patient bp was taken with our monitor and than with patient monitor that she brought from home. Patient personal monitor is reading 20 points higher than ours.  ?Patient will get machine calibrated  ?

## 2021-05-24 ENCOUNTER — Encounter: Payer: Self-pay | Admitting: Family Medicine

## 2021-05-24 NOTE — Progress Notes (Signed)
? ?Established Patient Office Visit ? ?Subjective   ? ?Patient ID: Barbara Green, female    DOB: Apr 27, 1940  Age: 81 y.o. MRN: [id number] ? ?CC:  ?Chief Complaint  ?Patient presents with  ? Hypertension  ? ? ?HPI ?Barbara Green presents for follow up of hypertension. She reports that she has had intermittent dizziness with her present med and sometimes does not take it as recommended. She would like to try a new med. ? ?Outpatient Encounter Medications as of 05/23/2021  ?Medication Sig  ? acetaminophen (TYLENOL) 325 MG tablet Take 650 mg by mouth as needed.  ? ibuprofen (ADVIL) 600 MG tablet Take 1 tablet (600 mg total) by mouth every 6 (six) hours as needed.  ? lisinopril (ZESTRIL) 2.5 MG tablet Take 1 tablet (2.5 mg total) by mouth daily.  ? hydrochlorothiazide (HYDRODIURIL) 25 MG tablet Take 1 tablet (25 mg total) by mouth daily. (Patient not taking: Reported on 05/23/2021)  ? nicotine (NICODERM CQ - DOSED IN MG/24 HR) 7 mg/24hr patch Place 1 patch (7 mg total) onto the skin daily. (Patient not taking: Reported on 03/26/2021)  ? tiZANidine (ZANAFLEX) 4 MG tablet Take 1 tablet (4 mg total) by mouth every 6 (six) hours as needed for muscle spasms. (Patient not taking: Reported on 03/26/2021)  ? traMADol (ULTRAM) 50 MG tablet Take 1 tablet (50 mg total) by mouth every 6 (six) hours as needed for severe pain. (Patient not taking: Reported on 03/26/2021)  ? ?No facility-administered encounter medications on file as of 05/23/2021.  ? ? ?Past Medical History:  ?Diagnosis Date  ? History of radiation therapy 06/29/19-07/29/19  ? Right breast IMRT- Dr. Sondra Come  ? Hypertension   ? ? ?Past Surgical History:  ?Procedure Laterality Date  ? BREAST LUMPECTOMY WITH SENTINEL LYMPH NODE BIOPSY Right 05/17/2019  ? Procedure: RIGHT BREAST LUMPECTOMY WITH SENTINEL LYMPH NODE BIOPSY;  Surgeon: Coralie Keens, MD;  Location: [place];  Service: General;  Laterality: Right;  ? NO PAST SURGERIES    ? ? ?Family History  ?Problem  Relation Age of Onset  ? Alzheimer's disease Mother   ? Heart disease Father   ? Alcohol abuse Father   ? ? ?Social History  ? ?Socioeconomic History  ? Marital status: Married  ?  Spouse name: Not on file  ? Number of children: 3  ? Years of education: Not on file  ? Highest education level: 12th grade  ?Occupational History  ? Not on file  ?Tobacco Use  ? Smoking status: Every Day  ?  Packs/day: 0.33  ?  Years: 30.00  ?  Pack years: 9.90  ?  Types: Cigarettes  ? Smokeless tobacco: Never  ?Vaping Use  ? Vaping Use: Never used  ?Substance and Sexual Activity  ? Alcohol use: Yes  ?  Comment: occasionally a glass wine  ? Drug use: Never  ? Sexual activity: Not on file  ?Other Topics Concern  ? Not on file  ?Social History Narrative  ? Not on file  ? ?Social Determinants of Health  ? ?Financial Resource Strain: Not on file  ?Food Insecurity: Not on file  ?Transportation Needs: Not on file  ?Physical Activity: Not on file  ?Stress: Not on file  ?Social Connections: Not on file  ?Intimate Partner Violence: Not on file  ? ? ?Review of Systems  ?All other systems reviewed and are negative. ? ?  ? ? ?Objective   ? ?BP 132/84   Pulse (!) 107  Temp 98 ?F (36.7 ?C) (Oral)   Resp 16   Wt 114 lb (51.7 kg)   SpO2 96%   BMI 22.26 kg/m?  ? ?Physical Exam ?Vitals and nursing note reviewed.  ?Constitutional:   ?   General: She is not in acute distress. ?Cardiovascular:  ?   Rate and Rhythm: Normal rate and regular rhythm.  ?Pulmonary:  ?   Effort: Pulmonary effort is normal.  ?   Breath sounds: Normal breath sounds.  ?Abdominal:  ?   Palpations: Abdomen is soft.  ?   Tenderness: There is no abdominal tenderness.  ?Musculoskeletal:  ?   Right lower leg: No edema.  ?   Left lower leg: No edema.  ?Neurological:  ?   General: No focal deficit present.  ?   Mental Status: She is alert and oriented to person, place, and time.  ? ? ? ?  ? ?Assessment & Plan:  ? ?1. Essential hypertension ?Patient requests change in agent. Lisinopril  2.5 mg prescribed -will monitor ? ? ?Return in about 4 weeks (around 06/20/2021) for follow up.  ? ?Becky Sax, MD ? ? ?

## 2021-05-29 NOTE — Progress Notes (Signed)
?Radiation Oncology         [phone] ?________________________________ ? ?Name: Barbara Green MRN: [id number]  ?Date: 05/30/2021  DOB: 04/13/1940 ? ?Follow-Up Visit Note ? ?CC: Dorna Mai, MD  Caryl Never* ? ?  ICD-10-CM   ?1. Malignant neoplasm of upper-inner quadrant of right breast in female, estrogen receptor positive ([place])  C50.211 MM DIAG BREAST TOMO BILATERAL  ? Z17.0   ?  ? ? ?Diagnosis: Stage IA (pT1c, pN0, cM0) Right Breast UIQ, Invasive Ductal Carcinoma, ER+ / PR+ / Her2-, Grade 2 ? ?Interval Since Last Radiation:  1 year, 10 months, and 2 days  ? ?Radiation Treatment Dates: 06/29/2019 through 07/29/2019 ?Site Technique Total Dose (Gy) Dose per Fx (Gy) Completed Fx Beam Energies  ?Breast, Right: Breast_Rt 3D 40.05/40.05 2.67 15/15 6X  ?Breast, Right: Breast_Rt_Bst 3D 16/16 2 8/8 6X, 10X  ? ? ?Narrative:  The patient returns today for routine 6 month follow-up. No significant interval history since the patient was last seen.  ? ?Of note: the patient presented to an urgent care on 12/12/20 with right shoulder and neck pain after doing some yard work which radiated to her head. She was ultimately treated for a muscle strain with steroids and muscle relaxer's.               ? ?She denies any further pain from her right clavicular fracture last year.  She denies any pain within the right breast area nipple discharge or bleeding.  She denies any problems with swelling in the right arm or hand.                 ? ?Allergies:  has No Known Allergies. ? ?Meds: ?Current Outpatient Medications  ?Medication Sig Dispense Refill  ? acetaminophen (TYLENOL) 325 MG tablet Take 650 mg by mouth as needed.    ? lisinopril (ZESTRIL) 2.5 MG tablet Take 1 tablet (2.5 mg total) by mouth daily. 30 tablet 1  ? hydrochlorothiazide (HYDRODIURIL) 25 MG tablet Take 1 tablet (25 mg total) by mouth daily. (Patient not taking: Reported on 05/23/2021) 90 tablet 1  ? ibuprofen (ADVIL) 600 MG tablet Take 1 tablet (600 mg  total) by mouth every 6 (six) hours as needed. (Patient not taking: Reported on 05/30/2021) 30 tablet 0  ? nicotine (NICODERM CQ - DOSED IN MG/24 HR) 7 mg/24hr patch Place 1 patch (7 mg total) onto the skin daily. (Patient not taking: Reported on 03/26/2021) 28 patch 1  ? tiZANidine (ZANAFLEX) 4 MG tablet Take 1 tablet (4 mg total) by mouth every 6 (six) hours as needed for muscle spasms. (Patient not taking: Reported on 03/26/2021) 30 tablet 0  ? traMADol (ULTRAM) 50 MG tablet Take 1 tablet (50 mg total) by mouth every 6 (six) hours as needed for severe pain. (Patient not taking: Reported on 03/26/2021) 12 tablet 0  ? ?No current facility-administered medications for this encounter.  ? ? ?Physical Findings: ?The patient is in no acute distress. Patient is alert and oriented. ? height is 5' (1.524 m) and weight is 115 lb (52.2 kg). Her temperature is 96.8 ?F (36 ?C) (abnormal). Her blood pressure is 154/98 (abnormal) and her pulse is 93. Her respiration is 18 and oxygen saturation is 99%. .   Lungs are clear to auscultation bilaterally. Heart has regular rate and rhythm. No palpable cervical, supraclavicular, or axillary adenopathy. Abdomen soft, non-tender, normal bowel sounds. ? ?Right Breast: no palpable mass, nipple discharge or bleeding.  She does have some  swelling in the right breast but this does not seem to be bothering her.  She has a medially placed lumpectomy scar adjacent to the sternum which shows some radiation changes.  No palpable or visible signs of recurrence in this area.  The left breast was without mass nipple discharge or bleeding. ? ? ? ?Lab Findings: ?Lab Results  ?Component Value Date  ? WBC 10.4 02/08/2019  ? HGB 14.8 02/08/2019  ? HCT 43.8 02/08/2019  ? MCV 91 02/08/2019  ? PLT 335 02/08/2019  ? ? ?Radiographic Findings: ?No results found. ? ?Impression:  Stage IA (pT1c, pN0, cM0) Right Breast UIQ, Invasive Ductal Carcinoma, ER+ / PR+ / Her2-, Grade 2 ? ?No evidence of recurrence on clinical  exam today.  She elected not to proceed with adjuvant hormonal therapy and is not following up in medical oncology. ? ?Plan: She will proceed with bilateral mammograms in November.  I will see her in early December to review and for physical examination. ? ? ?20 minutes of total time was spent for this patient encounter, including preparation, face-to-face counseling with the patient and coordination of care, physical exam, and documentation of the encounter. ?____________________________________ ? ?Blair Promise, PhD, MD ? ?This document serves as a record of services personally performed by Gery Pray, MD. It was created on his behalf by Roney Mans, a trained medical scribe. The creation of this record is based on the scribe's personal observations and the provider's statements to them. This document has been checked and approved by the attending provider. ? ?

## 2021-05-30 ENCOUNTER — Other Ambulatory Visit: Payer: Self-pay

## 2021-05-30 ENCOUNTER — Encounter: Payer: Self-pay | Admitting: Radiation Oncology

## 2021-05-30 ENCOUNTER — Ambulatory Visit
Admission: RE | Admit: 2021-05-30 | Discharge: 2021-05-30 | Disposition: A | Discharge status: Home / Self Care | Payer: Medicare Other | Source: Ambulatory Visit | Attending: Radiation Oncology | Admitting: Radiation Oncology

## 2021-05-30 DIAGNOSIS — Z853 Personal history of malignant neoplasm of breast: Secondary | ICD-10-CM | POA: Insufficient documentation

## 2021-05-30 DIAGNOSIS — Z923 Personal history of irradiation: Secondary | ICD-10-CM | POA: Insufficient documentation

## 2021-05-30 DIAGNOSIS — Z79899 Other long term (current) drug therapy: Secondary | ICD-10-CM | POA: Insufficient documentation

## 2021-05-30 DIAGNOSIS — Z170 Estrogen receptor positive status [ER+]: Secondary | ICD-10-CM

## 2021-05-30 NOTE — Progress Notes (Signed)
Barbara Green is here today for follow up post radiation to the breast. ? ? Breast Side:Right ? ? ?They completed their radiation on: 07/29/19  ? ?Does the patient complain of any of the following: ?Post radiation skin issues: No ?Breast Tenderness: No ?Breast Swelling: No ?Lymphadema: No ?Range of Motion limitations: No ?Fatigue post radiation: No ?Appetite good/fair/poor: good ? ?Additional comments if applicable:  ?Vitals:  ? 05/30/21 1002  ?BP: (!) 154/98  ?Pulse: 93  ?Resp: 18  ?Temp: (!) 96.8 ?F (36 ?C)  ?SpO2: 99%  ?Weight: 115 lb (52.2 kg)  ?Height: 5' (1.524 m)  ?  ?

## 2021-06-20 IMAGING — DX DG SHOULDER 2+V*R*
4 series · 4 of 4 positions shown · non-contrast
Comparison: None.

CLINICAL DATA: Right shoulder pain after falling 1 week previous

EXAM:
RIGHT SHOULDER - 2+ VIEW

[shoulder neutral ap]
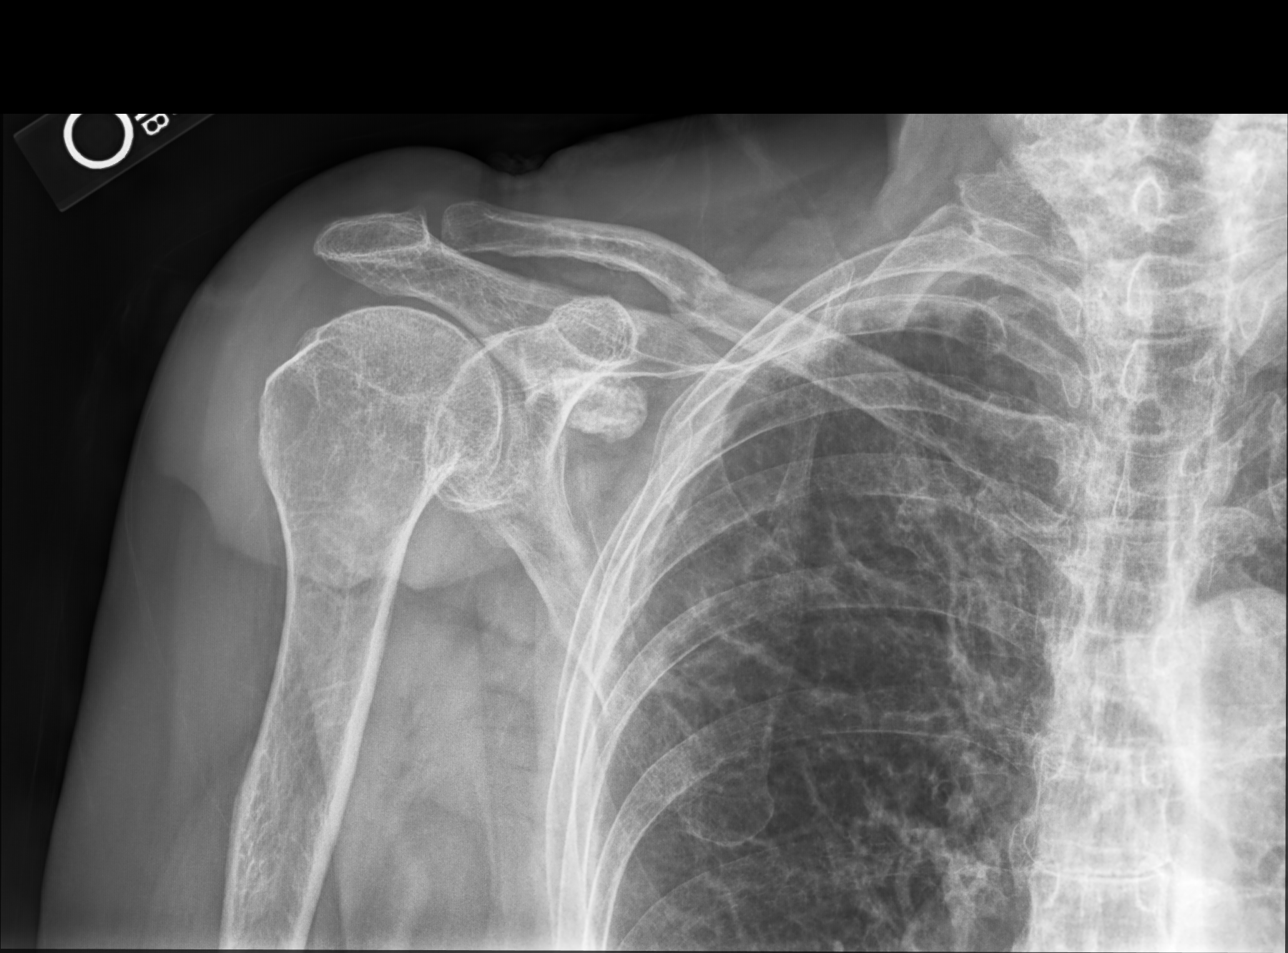

[shoulder axial 5° seated]
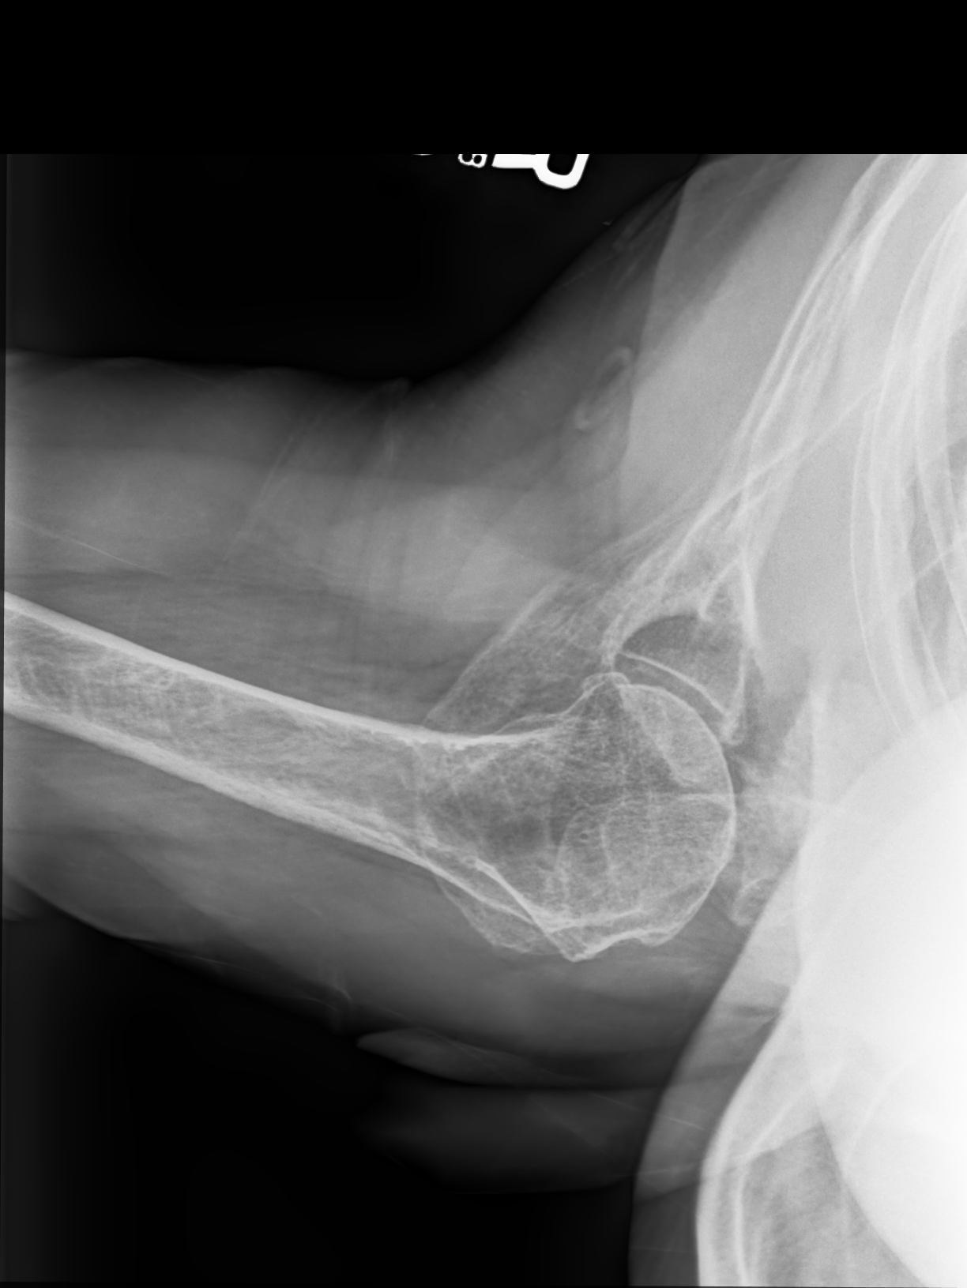

[shoulder internal rotation ap]
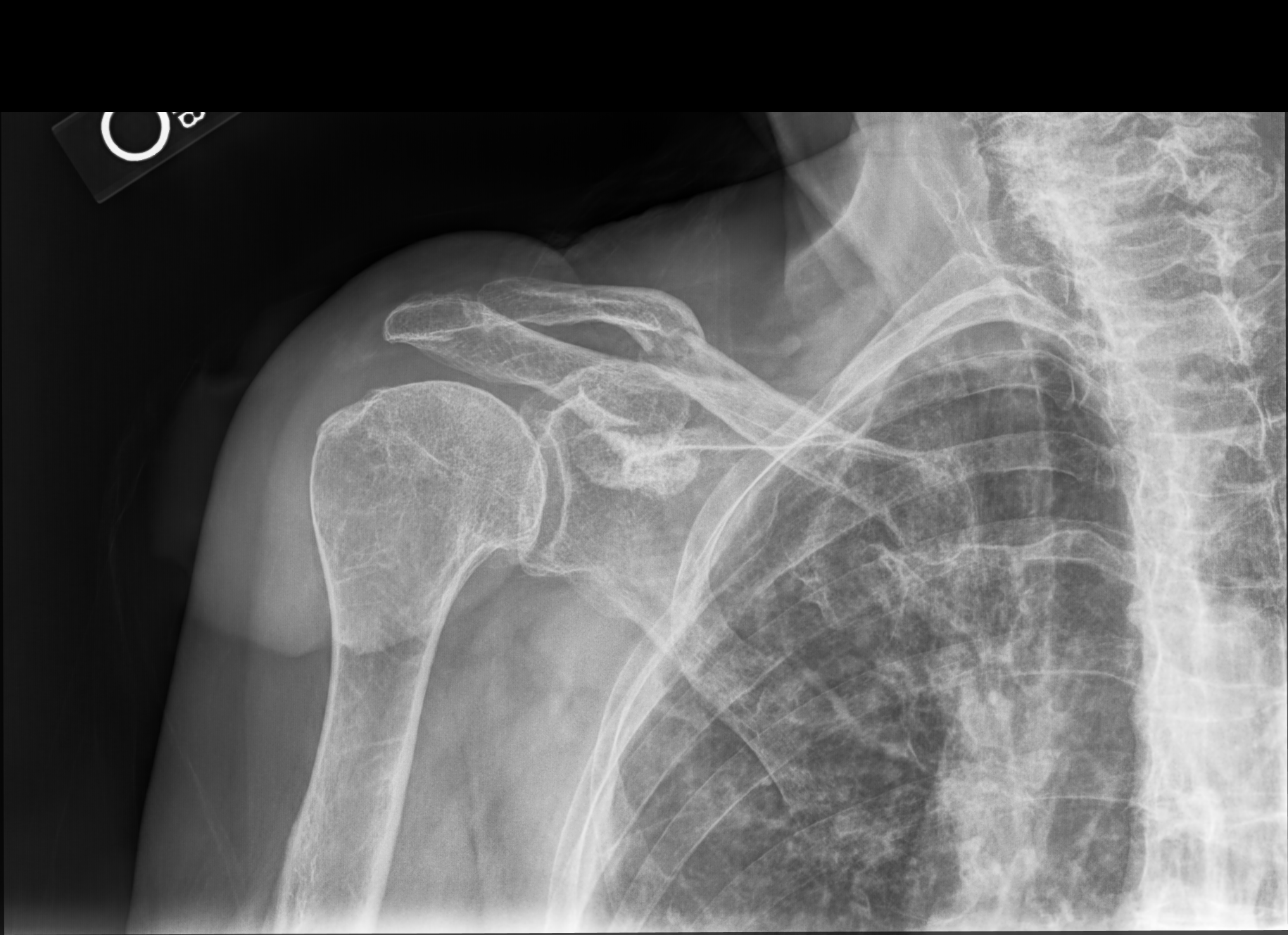

[shoulder transscapular y view (neer)]
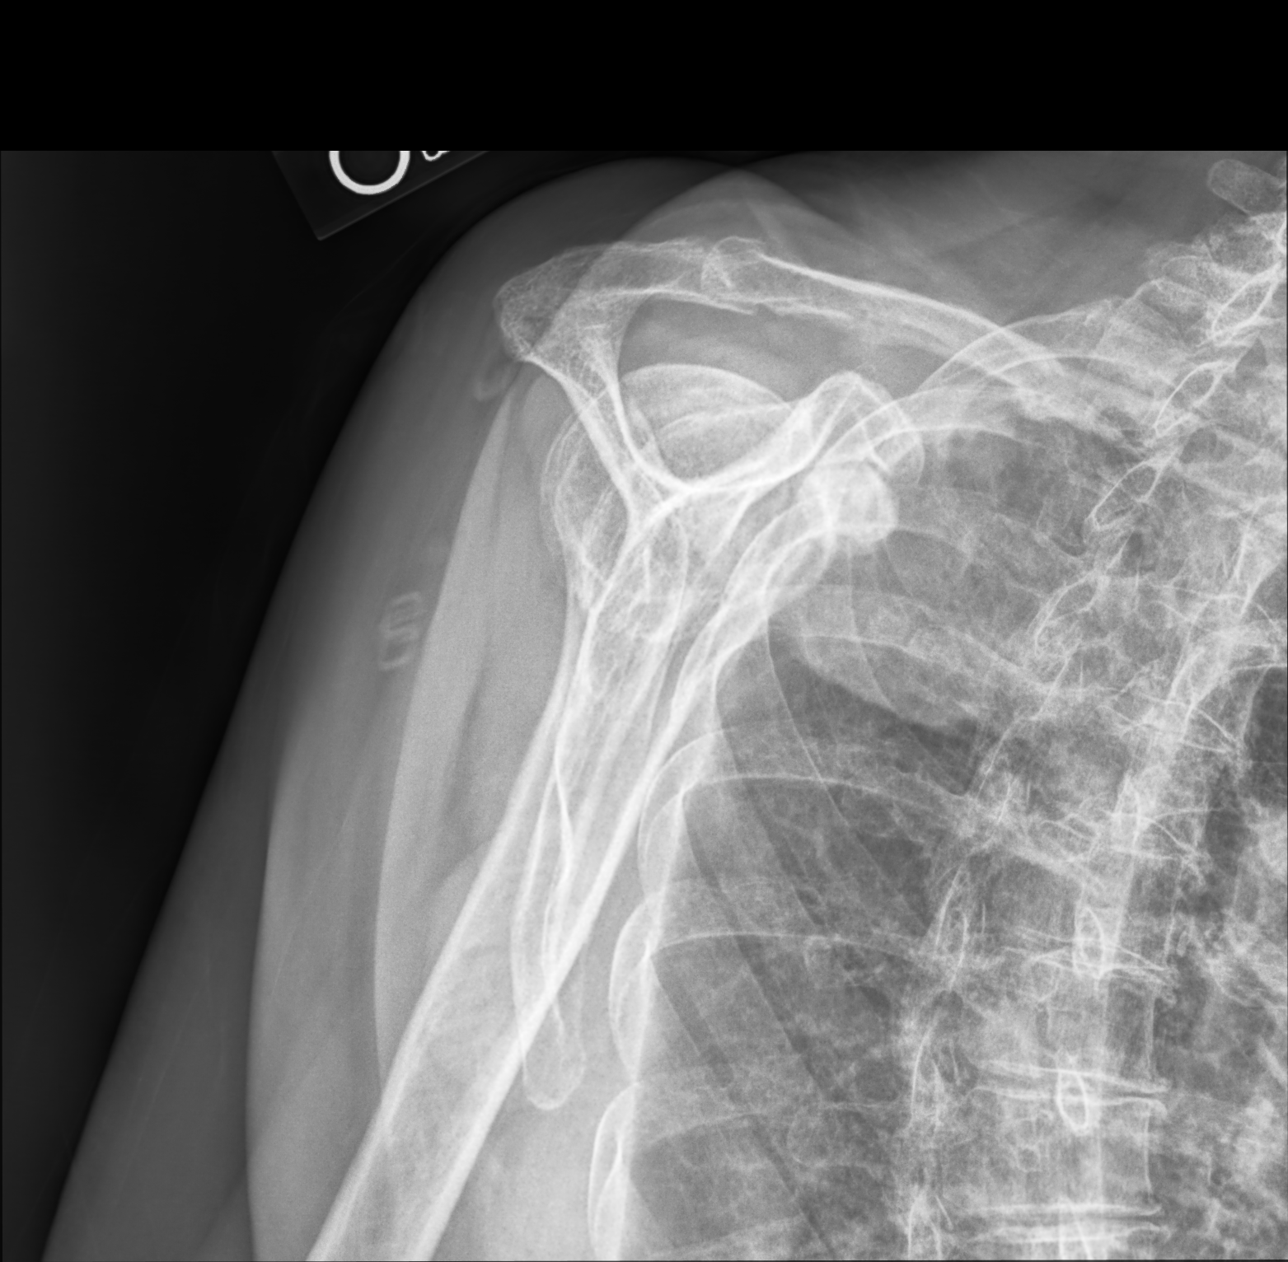

[4 of 4 positions shown; findings below may reference images not displayed]

FINDINGS: Acute minimally displaced fracture through the mid aspect of the
right clavicle. The bones appear diffusely demineralized. No other
acute fracture or malalignment identified.
IMPRESSION: Acute minimally displaced fracture through the mid shaft of the
right clavicle.

## 2021-07-24 NOTE — Progress Notes (Signed)
Patient ID: Barbara Green, female    DOB: 07/09/1940  MRN: [id number]  CC: Hypertension Follow-Up  Subjective: Tyrone Balash is a 81 y.o. female who presents for hypertension follow-up.   Her concerns today include:  Since last appointment reports blood pressures are at goal. Endorses intermittent dizziness. Taking medication prior to bedtime. No other issues/concerns.   Patient Active Problem List   Diagnosis Date Noted   Malignant neoplasm of upper-inner quadrant of right breast in female, estrogen receptor positive ([place]) 04/20/2019   Tobacco dependence 02/08/2019   Unspecified lump in the right breast, upper inner quadrant  02/08/2019   Essential hypertension 02/08/2019   Tachycardia 02/08/2019     Current Outpatient Medications on File Prior to Visit  Medication Sig Dispense Refill   acetaminophen (TYLENOL) 325 MG tablet Take 650 mg by mouth as needed.     hydrochlorothiazide (HYDRODIURIL) 25 MG tablet Take 1 tablet (25 mg total) by mouth daily. (Patient not taking: Reported on 05/23/2021) 90 tablet 1   ibuprofen (ADVIL) 600 MG tablet Take 1 tablet (600 mg total) by mouth every 6 (six) hours as needed. (Patient not taking: Reported on 05/30/2021) 30 tablet 0   lisinopril (ZESTRIL) 2.5 MG tablet Take 1 tablet (2.5 mg total) by mouth daily. 30 tablet 1   nicotine (NICODERM CQ - DOSED IN MG/24 HR) 7 mg/24hr patch Place 1 patch (7 mg total) onto the skin daily. (Patient not taking: Reported on 03/26/2021) 28 patch 1   tiZANidine (ZANAFLEX) 4 MG tablet Take 1 tablet (4 mg total) by mouth every 6 (six) hours as needed for muscle spasms. (Patient not taking: Reported on 03/26/2021) 30 tablet 0   traMADol (ULTRAM) 50 MG tablet Take 1 tablet (50 mg total) by mouth every 6 (six) hours as needed for severe pain. (Patient not taking: Reported on 03/26/2021) 12 tablet 0   No current facility-administered medications on file prior to visit.    No Known Allergies  Social History    Socioeconomic History   Marital status: Married    Spouse name: Not on file   Number of children: 3   Years of education: Not on file   Highest education level: 12th grade  Occupational History   Not on file  Tobacco Use   Smoking status: Every Day    Packs/day: 0.33    Years: 30.00    Total pack years: 9.90    Types: Cigarettes    Passive exposure: Current   Smokeless tobacco: Never  Vaping Use   Vaping Use: Never used  Substance and Sexual Activity   Alcohol use: Yes    Comment: occasionally a glass wine   Drug use: Never   Sexual activity: Not on file  Other Topics Concern   Not on file  Social History Narrative   Not on file   Social Determinants of Health   Financial Resource Strain: Not on file  Food Insecurity: Not on file  Transportation Needs: Not on file  Physical Activity: Not on file  Stress: Not on file  Social Connections: Not on file  Intimate Partner Violence: Not on file    Family History  Problem Relation Age of Onset   Alzheimer's disease Mother    Heart disease Father    Alcohol abuse Father     Past Surgical History:  Procedure Laterality Date   BREAST LUMPECTOMY WITH SENTINEL LYMPH NODE BIOPSY Right 05/17/2019   Procedure: RIGHT BREAST LUMPECTOMY WITH SENTINEL LYMPH NODE BIOPSY;  Surgeon:  Coralie Keens, MD;  Location: [facility];  Service: General;  Laterality: Right;   NO PAST SURGERIES      ROS: Review of Systems Negative except as stated above  PHYSICAL EXAM: BP 125/85 (BP Location: Left Arm, Patient Position: Sitting, Cuff Size: Normal)   Pulse 100   Temp 98.3 F (36.8 C)   Resp 16   Ht 5' (1.524 m)   Wt 111 lb (50.3 kg)   SpO2 97%   BMI 21.68 kg/m   Physical Exam HENT:     Head: Normocephalic and atraumatic.  Eyes:     Extraocular Movements: Extraocular movements intact.     Conjunctiva/sclera: Conjunctivae normal.     Pupils: Pupils are equal, round, and reactive to light.  Cardiovascular:      Rate and Rhythm: Normal rate and regular rhythm.     Pulses: Normal pulses.     Heart sounds: Normal heart sounds.  Pulmonary:     Effort: Pulmonary effort is normal.     Breath sounds: Normal breath sounds.  Musculoskeletal:     Cervical back: Normal range of motion and neck supple.  Neurological:     General: No focal deficit present.     Mental Status: She is alert and oriented to person, place, and time.  Psychiatric:        Mood and Affect: Mood normal.        Behavior: Behavior normal.     ASSESSMENT AND PLAN: 1. Essential hypertension - Counseled to take Lisinopril 2.5 mg daily by mouth every other day with goal of decreasing side effects of dizziness.  - Continue to monitor blood pressures at home.  - Follow-up with PCP in 7 days or sooner if needed.     Patient was given the opportunity to ask questions.  Patient verbalized understanding of the plan and was able to repeat key elements of the plan. Patient was given clear instructions to go to Emergency Department or return to medical center if symptoms don't improve, worsen, or new problems develop.The patient verbalized understanding.    Return in about 1 week (around 08/07/2021) for Follow-Up or next available blood pressure check with Dorna Mai, MD.  Camillia Herter, NP

## 2021-07-31 ENCOUNTER — Ambulatory Visit (INDEPENDENT_AMBULATORY_CARE_PROVIDER_SITE_OTHER): Payer: Medicare Other | Admitting: Family

## 2021-07-31 ENCOUNTER — Encounter: Payer: Self-pay | Admitting: Family

## 2021-07-31 VITALS — BP 125/85 | HR 100 | Temp 98.3°F | Resp 16 | Ht 60.0 in | Wt 111.0 lb

## 2021-07-31 DIAGNOSIS — I10 Essential (primary) hypertension: Secondary | ICD-10-CM

## 2021-07-31 NOTE — Progress Notes (Signed)
.  Pt presents for hypertension f/u , pt states that she is waking up lightheaded frequently since starting Lisinopril

## 2021-08-07 ENCOUNTER — Ambulatory Visit: Payer: Medicare Other | Admitting: Family Medicine

## 2021-08-26 ENCOUNTER — Other Ambulatory Visit: Payer: Self-pay | Admitting: Family Medicine

## 2021-08-26 DIAGNOSIS — I10 Essential (primary) hypertension: Secondary | ICD-10-CM

## 2021-08-26 NOTE — Telephone Encounter (Signed)
Order complete. Schedule appointment.

## 2021-09-09 ENCOUNTER — Encounter: Payer: Self-pay | Admitting: Family Medicine

## 2021-09-09 ENCOUNTER — Ambulatory Visit (INDEPENDENT_AMBULATORY_CARE_PROVIDER_SITE_OTHER): Payer: Medicare Other | Admitting: Family Medicine

## 2021-09-09 VITALS — BP 137/89 | HR 102 | Temp 99.0°F | Resp 16 | Ht 60.0 in | Wt 111.0 lb

## 2021-09-09 DIAGNOSIS — R5383 Other fatigue: Secondary | ICD-10-CM

## 2021-09-09 DIAGNOSIS — I10 Essential (primary) hypertension: Secondary | ICD-10-CM | POA: Diagnosis not present

## 2021-09-09 NOTE — Progress Notes (Unsigned)
Patient is concern as to why she is always fatigue. Patient said that she has no energy to do much of anything anymore.

## 2021-09-10 LAB — CBC WITH DIFFERENTIAL/PLATELET
Basophils Absolute: 0.1 10*3/uL (ref 0.0–0.2)
Basos: 1 %
EOS (ABSOLUTE): 0.1 10*3/uL (ref 0.0–0.4)
Eos: 1 %
Hematocrit: 48.8 % — ABNORMAL HIGH (ref 34.0–46.6)
Hemoglobin: 16.5 g/dL — ABNORMAL HIGH (ref 11.1–15.9)
Immature Grans (Abs): 0 10*3/uL (ref 0.0–0.1)
Immature Granulocytes: 0 %
Lymphocytes Absolute: 2.6 10*3/uL (ref 0.7–3.1)
Lymphs: 29 %
MCH: 30.4 pg (ref 26.6–33.0)
MCHC: 33.8 g/dL (ref 31.5–35.7)
MCV: 90 fL (ref 79–97)
Monocytes Absolute: 1 10*3/uL — ABNORMAL HIGH (ref 0.1–0.9)
Monocytes: 11 %
Neutrophils Absolute: 5.3 10*3/uL (ref 1.4–7.0)
Neutrophils: 58 %
Platelets: 298 10*3/uL (ref 150–450)
RBC: 5.42 x10E6/uL — ABNORMAL HIGH (ref 3.77–5.28)
RDW: 13.2 % (ref 11.7–15.4)
WBC: 9 10*3/uL (ref 3.4–10.8)

## 2021-09-10 LAB — CMP14+EGFR
ALT: 13 IU/L (ref 0–32)
AST: 18 IU/L (ref 0–40)
Albumin/Globulin Ratio: 1.9 (ref 1.2–2.2)
Albumin: 4.6 g/dL (ref 3.8–4.8)
Alkaline Phosphatase: 145 IU/L — ABNORMAL HIGH (ref 44–121)
BUN/Creatinine Ratio: 14 (ref 12–28)
BUN: 7 mg/dL — ABNORMAL LOW (ref 8–27)
Bilirubin Total: 0.6 mg/dL (ref 0.0–1.2)
CO2: 22 mmol/L (ref 20–29)
Calcium: 10.6 mg/dL — ABNORMAL HIGH (ref 8.7–10.3)
Chloride: 88 mmol/L — ABNORMAL LOW (ref 96–106)
Creatinine, Ser: 0.49 mg/dL — ABNORMAL LOW (ref 0.57–1.00)
Globulin, Total: 2.4 g/dL (ref 1.5–4.5)
Glucose: 103 mg/dL — ABNORMAL HIGH (ref 70–99)
Potassium: 6.5 mmol/L — ABNORMAL HIGH (ref 3.5–5.2)
Sodium: 129 mmol/L — ABNORMAL LOW (ref 134–144)
Total Protein: 7 g/dL (ref 6.0–8.5)
eGFR: 95 mL/min/{1.73_m2} (ref >59)

## 2021-09-10 LAB — TSH: TSH: 1.3 u[IU]/mL (ref 0.450–4.500)

## 2021-09-11 ENCOUNTER — Other Ambulatory Visit: Payer: Self-pay | Admitting: *Deleted

## 2021-09-11 ENCOUNTER — Encounter: Payer: Self-pay | Admitting: Family Medicine

## 2021-09-11 DIAGNOSIS — R5383 Other fatigue: Secondary | ICD-10-CM

## 2021-09-11 NOTE — Progress Notes (Signed)
Established Patient Office Visit  Subjective    Patient ID: Barbara Green, female    DOB: 04/16/1940  Age: 81 y.o. MRN: [id number]  CC: No chief complaint on file.   HPI Barbara Green presents with complaint of persistent and increasing fatigue over the past several weeks.    Outpatient Encounter Medications as of 09/09/2021  Medication Sig   acetaminophen (TYLENOL) 325 MG tablet Take 650 mg by mouth as needed.   lisinopril (ZESTRIL) 2.5 MG tablet TAKE 1 TABLET BY MOUTH DAILY.   [DISCONTINUED] hydrochlorothiazide (HYDRODIURIL) 25 MG tablet Take 1 tablet (25 mg total) by mouth daily. (Patient not taking: Reported on 05/23/2021)   [DISCONTINUED] ibuprofen (ADVIL) 600 MG tablet Take 1 tablet (600 mg total) by mouth every 6 (six) hours as needed. (Patient not taking: Reported on 05/30/2021)   [DISCONTINUED] nicotine (NICODERM CQ - DOSED IN MG/24 HR) 7 mg/24hr patch Place 1 patch (7 mg total) onto the skin daily. (Patient not taking: Reported on 03/26/2021)   [DISCONTINUED] tiZANidine (ZANAFLEX) 4 MG tablet Take 1 tablet (4 mg total) by mouth every 6 (six) hours as needed for muscle spasms. (Patient not taking: Reported on 03/26/2021)   [DISCONTINUED] traMADol (ULTRAM) 50 MG tablet Take 1 tablet (50 mg total) by mouth every 6 (six) hours as needed for severe pain. (Patient not taking: Reported on 03/26/2021)   No facility-administered encounter medications on file as of 09/09/2021.    Past Medical History:  Diagnosis Date   History of radiation therapy 06/29/19-07/29/19   Right breast IMRT- Dr. Sondra Come   Hypertension     Past Surgical History:  Procedure Laterality Date   BREAST LUMPECTOMY WITH SENTINEL LYMPH NODE BIOPSY Right 05/17/2019   Procedure: RIGHT BREAST LUMPECTOMY WITH SENTINEL LYMPH NODE BIOPSY;  Surgeon: Coralie Keens, MD;  Location: [facility];  Service: General;  Laterality: Right;   NO PAST SURGERIES      Family History  Problem Relation Age of Onset    Alzheimer's disease Mother    Heart disease Father    Alcohol abuse Father     Social History   Socioeconomic History   Marital status: Married    Spouse name: Not on file   Number of children: 3   Years of education: Not on file   Highest education level: 12th grade  Occupational History   Not on file  Tobacco Use   Smoking status: Every Day    Packs/day: 0.33    Years: 30.00    Total pack years: 9.90    Types: Cigarettes    Passive exposure: Current   Smokeless tobacco: Never  Vaping Use   Vaping Use: Never used  Substance and Sexual Activity   Alcohol use: Yes    Comment: occasionally a glass wine   Drug use: Never   Sexual activity: Not on file  Other Topics Concern   Not on file  Social History Narrative   Not on file   Social Determinants of Health   Financial Resource Strain: Not on file  Food Insecurity: Not on file  Transportation Needs: Not on file  Physical Activity: Not on file  Stress: Not on file  Social Connections: Not on file  Intimate Partner Violence: Not on file    Review of Systems  Constitutional:  Positive for malaise/fatigue.  Cardiovascular: Negative.   Gastrointestinal: Negative.   All other systems reviewed and are negative.       Objective    BP 137/89  Pulse (!) 102   Temp 99 F (37.2 C) (Oral)   Resp 16   Ht 5' (1.524 m)   Wt 111 lb (50.3 kg)   BMI 21.68 kg/m   Physical Exam Vitals and nursing note reviewed.  Constitutional:      General: She is not in acute distress. Cardiovascular:     Rate and Rhythm: Normal rate and regular rhythm.  Pulmonary:     Effort: Pulmonary effort is normal.     Breath sounds: Normal breath sounds.  Abdominal:     Palpations: Abdomen is soft.     Tenderness: There is no abdominal tenderness.  Neurological:     General: No focal deficit present.     Mental Status: She is alert and oriented to person, place, and time.  Psychiatric:        Mood and Affect: Mood normal.         Behavior: Behavior normal.         Assessment & Plan:   1. Other fatigue Monitoring labs ordered and are pending - CBC with Differential - CMP14+EGFR - TSH  2. Essential hypertension Appears stable with present management. Continue and monitor  No follow-ups on file.   Becky Sax, MD

## 2021-09-18 ENCOUNTER — Other Ambulatory Visit: Payer: Medicare Other

## 2021-09-18 DIAGNOSIS — R5383 Other fatigue: Secondary | ICD-10-CM

## 2021-09-19 LAB — CMP14+EGFR
ALT: 12 IU/L (ref 0–32)
AST: 20 IU/L (ref 0–40)
Albumin/Globulin Ratio: 2 (ref 1.2–2.2)
Albumin: 4.6 g/dL (ref 3.8–4.8)
Alkaline Phosphatase: 137 IU/L — ABNORMAL HIGH (ref 44–121)
BUN/Creatinine Ratio: 11 — ABNORMAL LOW (ref 12–28)
BUN: 5 mg/dL — ABNORMAL LOW (ref 8–27)
Bilirubin Total: 0.6 mg/dL (ref 0.0–1.2)
CO2: 21 mmol/L (ref 20–29)
Calcium: 9.6 mg/dL (ref 8.7–10.3)
Chloride: 89 mmol/L — ABNORMAL LOW (ref 96–106)
Creatinine, Ser: 0.47 mg/dL — ABNORMAL LOW (ref 0.57–1.00)
Globulin, Total: 2.3 g/dL (ref 1.5–4.5)
Glucose: 94 mg/dL (ref 70–99)
Potassium: 4.3 mmol/L (ref 3.5–5.2)
Sodium: 129 mmol/L — ABNORMAL LOW (ref 134–144)
Total Protein: 6.9 g/dL (ref 6.0–8.5)
eGFR: 96 mL/min/{1.73_m2} (ref >59)

## 2021-09-19 LAB — CBC WITH DIFFERENTIAL/PLATELET
Basophils Absolute: 0.1 10*3/uL (ref 0.0–0.2)
Basos: 1 %
EOS (ABSOLUTE): 0 10*3/uL (ref 0.0–0.4)
Eos: 1 %
Hematocrit: 45.9 % (ref 34.0–46.6)
Hemoglobin: 15.7 g/dL (ref 11.1–15.9)
Immature Grans (Abs): 0 10*3/uL (ref 0.0–0.1)
Immature Granulocytes: 0 %
Lymphocytes Absolute: 2.5 10*3/uL (ref 0.7–3.1)
Lymphs: 31 %
MCH: 30.8 pg (ref 26.6–33.0)
MCHC: 34.2 g/dL (ref 31.5–35.7)
MCV: 90 fL (ref 79–97)
Monocytes Absolute: 1.1 10*3/uL — ABNORMAL HIGH (ref 0.1–0.9)
Monocytes: 14 %
Neutrophils Absolute: 4.4 10*3/uL (ref 1.4–7.0)
Neutrophils: 53 %
Platelets: 369 10*3/uL (ref 150–450)
RBC: 5.09 x10E6/uL (ref 3.77–5.28)
RDW: 13.4 % (ref 11.7–15.4)
WBC: 8.2 10*3/uL (ref 3.4–10.8)

## 2021-09-24 ENCOUNTER — Telehealth: Payer: Self-pay | Admitting: Family Medicine

## 2021-09-24 NOTE — Telephone Encounter (Signed)
Patient inquiring about 09/18/2021 lab results.

## 2021-12-11 ENCOUNTER — Ambulatory Visit
Admission: RE | Admit: 2021-12-11 | Discharge: 2021-12-11 | Disposition: A | Discharge status: Home / Self Care | Payer: Medicare Other | Source: Ambulatory Visit | Attending: Radiation Oncology | Admitting: Radiation Oncology

## 2021-12-11 DIAGNOSIS — Z170 Estrogen receptor positive status [ER+]: Secondary | ICD-10-CM

## 2022-01-01 NOTE — Progress Notes (Signed)
Radiation Oncology         [phone] ________________________________  Name: TAKEELA PEIL MRN: [id number]  Date: 01/02/2022  DOB: Jun 20, 1940  Follow-Up Visit Note  CC: Dorna Mai, MD  Caryl Never*  No diagnosis found.  Diagnosis: Stage IA (pT1c, pN0, cM0) Right Breast UIQ, Invasive Ductal Carcinoma, ER+ / PR+ / Her2-, Grade 2   Interval Since Last Radiation: 2 years, 5 months, and 5 days   Radiation Treatment Dates: 06/29/2019 through 07/29/2019 Site Technique Total Dose (Gy) Dose per Fx (Gy) Completed Fx Beam Energies  Breast, Right: Breast_Rt 3D 40.05/40.05 2.67 15/15 6X  Breast, Right: Breast_Rt_Bst 3D 16/16 2 8/8 6X, 10X   Narrative:  The patient returns today for routine follow-up. She was last seen here for follow-up on 05/30/21.     Her most recent follow-up diagnostic bilateral mammogram on 12/11/21 showed no evidence of new or recurrent breast carcinoma and benign surgical and radiation induced changes in the right breast.       Otherwise, no significant interval history since the patient was last seen.    ***                         Allergies:  has No Known Allergies.  Meds: Current Outpatient Medications  Medication Sig Dispense Refill   acetaminophen (TYLENOL) 325 MG tablet Take 650 mg by mouth as needed.     lisinopril (ZESTRIL) 2.5 MG tablet TAKE 1 TABLET BY MOUTH DAILY. 30 tablet 0   No current facility-administered medications for this encounter.    Physical Findings: The patient is in no acute distress. Patient is alert and oriented.  vitals were not taken for this visit. .  No significant changes. Lungs are clear to auscultation bilaterally. Heart has regular rate and rhythm. No palpable cervical, supraclavicular, or axillary adenopathy. Abdomen soft, non-tender, normal bowel sounds.  Left Breast: no palpable mass, nipple discharge or bleeding. Right Breast: ***  Lab Findings: Lab Results  Component Value Date   WBC 8.2 09/18/2021    HGB 15.7 09/18/2021   HCT 45.9 09/18/2021   MCV 90 09/18/2021   PLT 369 09/18/2021    Radiographic Findings: MM DIAG BREAST TOMO BILATERAL  Result Date: 12/11/2021 CLINICAL DATA:  Patient right lumpectomy for invasive ductal carcinoma, which was in the far medial aspect of the right breast. She also underwent adjuvant radiation therapy. She currently presents for annual routine post lumpectomy surveillance. EXAM: DIGITAL DIAGNOSTIC BILATERAL MAMMOGRAM WITH TOMOSYNTHESIS TECHNIQUE: Bilateral digital diagnostic mammography and breast tomosynthesis was performed. COMPARISON:  Previous exam(s). ACR Breast Density Category c: The breast tissue is heterogeneously dense, which may obscure small masses. FINDINGS: Right breast shows skin thickening and trabecular thickening consistent with radiation induced edema. There are no breast masses. Subtle postsurgical architectural distortion is noted in the far medial right breast. There are no areas of nonsurgical architectural distortion. There are no suspicious calcifications. IMPRESSION: 1. No evidence of new or recurrent breast carcinoma. 2. Benign post lumpectomy and radiation induced changes on the right. RECOMMENDATION: Diagnostic mammography in 1 year per standard post lumpectomy protocol. I have discussed the findings and recommendations with the patient. If applicable, a reminder letter will be sent to the patient regarding the next appointment. BI-RADS CATEGORY  2: Benign. Electronically Signed   By: Lajean Manes M.D.   On: 12/11/2021 14:56   Impression:  Stage IA (pT1c, pN0, cM0) Right Breast UIQ, Invasive Ductal Carcinoma, ER+ / PR+ /  Her2-, Grade 2   The patient is recovering from the effects of radiation.  ***  Plan:  ***   *** minutes of total time was spent for this patient encounter, including preparation, face-to-face counseling with the patient and coordination of care, physical exam, and documentation of the  encounter. ____________________________________  Blair Promise, PhD, MD  This document serves as a record of services personally performed by Gery Pray, MD. It was created on his behalf by Roney Mans, a trained medical scribe. The creation of this record is based on the scribe's personal observations and the provider's statements to them. This document has been checked and approved by the attending provider.

## 2022-01-02 ENCOUNTER — Encounter: Payer: Self-pay | Admitting: Radiation Oncology

## 2022-01-02 ENCOUNTER — Ambulatory Visit
Admission: RE | Admit: 2022-01-02 | Discharge: 2022-01-02 | Disposition: A | Discharge status: Home / Self Care | Payer: Medicare Other | Source: Ambulatory Visit | Attending: Radiation Oncology | Admitting: Radiation Oncology

## 2022-01-02 VITALS — BP 177/86 | HR 99 | Temp 97.5°F | Resp 20 | Ht 60.0 in | Wt 108.8 lb

## 2022-01-02 DIAGNOSIS — Z170 Estrogen receptor positive status [ER+]: Secondary | ICD-10-CM | POA: Insufficient documentation

## 2022-01-02 DIAGNOSIS — Z79899 Other long term (current) drug therapy: Secondary | ICD-10-CM | POA: Diagnosis not present

## 2022-01-02 DIAGNOSIS — Z923 Personal history of irradiation: Secondary | ICD-10-CM | POA: Diagnosis not present

## 2022-01-02 DIAGNOSIS — C50211 Malignant neoplasm of upper-inner quadrant of right female breast: Secondary | ICD-10-CM | POA: Diagnosis present

## 2022-01-02 NOTE — Progress Notes (Signed)
Barbara Green is here today for follow up post radiation to the breast.   Breast Side:Right   They completed their radiation on: 08/17/19   Does the patient complain of any of the following: Post radiation skin issues:  No Breast Tenderness: No Breast Swelling: Yes Lymphadema: No Range of Motion limitations: No Fatigue post radiation: No Appetite good/fair/poor: good  Additional comments if applicable:  Encouraged patient to follow up with PCP due to elevated blood pressure. Patient is not currently taking her lisinopril. Patient voiced understanding.   BP (!) 177/86 (BP Location: Right Arm, Patient Position: Sitting, Cuff Size: Normal)   Pulse 99   Temp (!) 97.5 F (36.4 C)   Resp 20   Ht 5' (1.524 m)   Wt 108 lb 12.8 oz (49.4 kg)   SpO2 100%   BMI 21.25 kg/m

## 2022-01-21 ENCOUNTER — Other Ambulatory Visit: Payer: Self-pay | Admitting: Family

## 2022-01-21 ENCOUNTER — Other Ambulatory Visit: Payer: Self-pay | Admitting: Family Medicine

## 2022-01-21 DIAGNOSIS — I10 Essential (primary) hypertension: Secondary | ICD-10-CM

## 2022-01-21 NOTE — Telephone Encounter (Signed)
Order complete. 

## 2022-01-21 NOTE — Telephone Encounter (Addendum)
Medication Refill - Medication: lisinopril (ZESTRIL) 2.5 MG tablet [[id number]]  Patient is completely out of medication.  I was able to schedule pt an appointment for 02/18/2022 at 2:20pm. Patient is looking for a short refill to hold her over until this appointment.   Has the patient contacted their pharmacy? Yes.   Pharmacy advised to contact PCP.  Preferred Pharmacy (with phone number or street name):  [facility], [place] Phone: [phone]  Fax: [phone]     Has the patient been seen for an appointment in the last year OR does the patient have an upcoming appointment? Yes.    Agent: Please be advised that RX refills may take up to 3 business days. We ask that you follow-up with your pharmacy.

## 2022-01-23 NOTE — Telephone Encounter (Signed)
Requested Prescriptions  Pending Prescriptions Disp Refills   lisinopril (ZESTRIL) 2.5 MG tablet 30 tablet 0    Sig: Take 1 tablet (2.5 mg total) by mouth daily.     Cardiovascular:  ACE Inhibitors Failed - 01/21/2022  5:18 PM      Failed - Cr in normal range and within 180 days    Creatinine, Ser  Date Value Ref Range Status  09/18/2021 0.47 (L) 0.57 - 1.00 mg/dL Final         Failed - Last BP in normal range    BP Readings from Last 1 Encounters:  01/02/22 (!) 177/86         Passed - K in normal range and within 180 days    Potassium  Date Value Ref Range Status  09/18/2021 4.3 3.5 - 5.2 mmol/L Final         Passed - Patient is not pregnant      Passed - Valid encounter within last 6 months    Recent Outpatient Visits           4 months ago Other fatigue   [facility] at [facility], MD   5 months ago Essential hypertension   Primary Care at [facility] [place], NP   8 months ago Essential hypertension   Primary Care at [facility], MD   9 months ago Essential hypertension   Primary Care at [facility], MD   10 months ago Essential hypertension   Primary Care at [facility], Kriste Basque, NP       Future Appointments             In 3 weeks Dorna Mai, MD [facility] at [facility]

## 2022-02-13 ENCOUNTER — Ambulatory Visit (INDEPENDENT_AMBULATORY_CARE_PROVIDER_SITE_OTHER): Payer: Medicare HMO

## 2022-02-13 VITALS — Ht 60.0 in | Wt 110.0 lb

## 2022-02-13 DIAGNOSIS — Z0000 Encounter for general adult medical examination without abnormal findings: Secondary | ICD-10-CM

## 2022-02-13 NOTE — Patient Instructions (Signed)
Barbara Green , Thank you for taking time to come for your Medicare Wellness Visit. I appreciate your ongoing commitment to your health goals. Please review the following plan we discussed and let me know if I can assist you in the future.   These are the goals we discussed:  Goals      Patient Stated     02/13/2022, no goals        This is a list of the screening recommended for you and due dates:  Health Maintenance  Topic Date Due   DTaP/Tdap/Td vaccine (1 - Tdap) Never done   Zoster (Shingles) Vaccine (1 of 2) Never done   DEXA scan (bone density measurement)  Never done   Flu Shot  Never done   Pneumonia Vaccine (1 - PCV) 09/10/2022*   Medicare Annual Wellness Visit  02/14/2023   HPV Vaccine  Aged Out   COVID-19 Vaccine  Discontinued  *Topic was postponed. The date shown is not the original due date.    Advanced directives: Please bring a copy of your POA (Power of Attorney) and/or Living Will to your next appointment.   Conditions/risks identified: smoking  Next appointment: Follow up in one year for your annual wellness visit    Preventive Care 81 Years and Older, Female Preventive care refers to lifestyle choices and visits with your health care provider that can promote health and wellness. What does preventive care include? A yearly physical exam. This is also called an annual well check. Dental exams once or twice a year. Routine eye exams. Ask your health care provider how often you should have your eyes checked. Personal lifestyle choices, including: Daily care of your teeth and gums. Regular physical activity. Eating a healthy diet. Avoiding tobacco and drug use. Limiting alcohol use. Practicing safe sex. Taking low-dose aspirin every day. Taking vitamin and mineral supplements as recommended by your health care provider. What happens during an annual well check? The services and screenings done by your health care provider during your annual well check  will depend on your age, overall health, lifestyle risk factors, and family history of disease. Counseling  Your health care provider may ask you questions about your: Alcohol use. Tobacco use. Drug use. Emotional well-being. Home and relationship well-being. Sexual activity. Eating habits. History of falls. Memory and ability to understand (cognition). Work and work Statistician. Reproductive health. Screening  You may have the following tests or measurements: Height, weight, and BMI. Blood pressure. Lipid and cholesterol levels. These may be checked every 5 years, or more frequently if you are over 25 years old. Skin check. Lung cancer screening. You may have this screening every year starting at age 10 if you have a 30-pack-year history of smoking and currently smoke or have quit within the past 15 years. Fecal occult blood test (FOBT) of the stool. You may have this test every year starting at age 40. Flexible sigmoidoscopy or colonoscopy. You may have a sigmoidoscopy every 5 years or a colonoscopy every 10 years starting at age 39. Hepatitis C blood test. Hepatitis B blood test. Sexually transmitted disease (STD) testing. Diabetes screening. This is done by checking your blood sugar (glucose) after you have not eaten for a while (fasting). You may have this done every 1-3 years. Bone density scan. This is done to screen for osteoporosis. You may have this done starting at age 53. Mammogram. This may be done every 1-2 years. Talk to your health care provider about how often you should  have regular mammograms. Talk with your health care provider about your test results, treatment options, and if necessary, the need for more tests. Vaccines  Your health care provider may recommend certain vaccines, such as: Influenza vaccine. This is recommended every year. Tetanus, diphtheria, and acellular pertussis (Tdap, Td) vaccine. You may need a Td booster every 10 years. Zoster vaccine. You  may need this after age 2. Pneumococcal 13-valent conjugate (PCV13) vaccine. One dose is recommended after age 50. Pneumococcal polysaccharide (PPSV23) vaccine. One dose is recommended after age 59. Talk to your health care provider about which screenings and vaccines you need and how often you need them. This information is not intended to replace advice given to you by your health care provider. Make sure you discuss any questions you have with your health care provider. Document Released: 02/09/2015 Document Revised: 10/03/2015 Document Reviewed: 11/14/2014 Elsevier Interactive Patient Education  2017 [employer] Prevention in the Home Falls can cause injuries. They can happen to people of all ages. There are many things you can do to make your home safe and to help prevent falls. What can I do on the outside of my home? Regularly fix the edges of walkways and driveways and fix any cracks. Remove anything that might make you trip as you walk through a door, such as a raised step or threshold. Trim any bushes or trees on the path to your home. Use bright outdoor lighting. Clear any walking paths of anything that might make someone trip, such as rocks or tools. Regularly check to see if handrails are loose or broken. Make sure that both sides of any steps have handrails. Any raised decks and porches should have guardrails on the edges. Have any leaves, snow, or ice cleared regularly. Use sand or salt on walking paths during winter. Clean up any spills in your garage right away. This includes oil or grease spills. What can I do in the bathroom? Use night lights. Install grab bars by the toilet and in the tub and shower. Do not use towel bars as grab bars. Use non-skid mats or decals in the tub or shower. If you need to sit down in the shower, use a plastic, non-slip stool. Keep the floor dry. Clean up any water that spills on the floor as soon as it happens. Remove soap buildup  in the tub or shower regularly. Attach bath mats securely with double-sided non-slip rug tape. Do not have throw rugs and other things on the floor that can make you trip. What can I do in the bedroom? Use night lights. Make sure that you have a light by your bed that is easy to reach. Do not use any sheets or blankets that are too big for your bed. They should not hang down onto the floor. Have a firm chair that has side arms. You can use this for support while you get dressed. Do not have throw rugs and other things on the floor that can make you trip. What can I do in the kitchen? Clean up any spills right away. Avoid walking on wet floors. Keep items that you use a lot in easy-to-reach places. If you need to reach something above you, use a strong step stool that has a grab bar. Keep electrical cords out of the way. Do not use floor polish or wax that makes floors slippery. If you must use wax, use non-skid floor wax. Do not have throw rugs and other things on the floor  that can make you trip. What can I do with my stairs? Do not leave any items on the stairs. Make sure that there are handrails on both sides of the stairs and use them. Fix handrails that are broken or loose. Make sure that handrails are as long as the stairways. Check any carpeting to make sure that it is firmly attached to the stairs. Fix any carpet that is loose or worn. Avoid having throw rugs at the top or bottom of the stairs. If you do have throw rugs, attach them to the floor with carpet tape. Make sure that you have a light switch at the top of the stairs and the bottom of the stairs. If you do not have them, ask someone to add them for you. What else can I do to help prevent falls? Wear shoes that: Do not have high heels. Have rubber bottoms. Are comfortable and fit you well. Are closed at the toe. Do not wear sandals. If you use a stepladder: Make sure that it is fully opened. Do not climb a closed  stepladder. Make sure that both sides of the stepladder are locked into place. Ask someone to hold it for you, if possible. Clearly mark and make sure that you can see: Any grab bars or handrails. First and last steps. Where the edge of each step is. Use tools that help you move around (mobility aids) if they are needed. These include: Canes. Walkers. Scooters. Crutches. Turn on the lights when you go into a dark area. Replace any light bulbs as soon as they burn out. Set up your furniture so you have a clear path. Avoid moving your furniture around. If any of your floors are uneven, fix them. If there are any pets around you, be aware of where they are. Review your medicines with your doctor. Some medicines can make you feel dizzy. This can increase your chance of falling. Ask your doctor what other things that you can do to help prevent falls. This information is not intended to replace advice given to you by your health care provider. Make sure you discuss any questions you have with your health care provider. Document Released: 11/09/2008 Document Revised: 06/21/2015 Document Reviewed: 02/17/2014 Elsevier Interactive Patient Education  2017 [employer].

## 2022-02-13 NOTE — Progress Notes (Signed)
I connected with Barbara Green today by telephone and verified that I am speaking with the correct person using two identifiers. Location patient: home Location provider: work Persons participating in the virtual visit: Emer, Onnen LPN.   I discussed the limitations, risks, security and privacy concerns of performing an evaluation and management service by telephone and the availability of in person appointments. I also discussed with the patient that there may be a patient responsible charge related to this service. The patient expressed understanding and verbally consented to this telephonic visit.    Interactive audio and video telecommunications were attempted between this provider and patient, however failed, due to patient having technical difficulties OR patient did not have access to video capability.  We continued and completed visit with audio only.     Vital signs may be patient reported or missing.  Subjective:   Barbara Green is a 82 y.o. female who presents for an Initial Medicare Annual Wellness Visit.  Review of Systems     Cardiac Risk Factors include: advanced age (>67mn, >>61women);hypertension;smoking/ tobacco exposure     Objective:    Today's Vitals   02/13/22 1627  Weight: 110 lb (49.9 kg)  Height: 5' (1.524 m)   Body mass index is 21.48 kg/m.     02/13/2022    4:31 PM 01/02/2022   10:03 AM 05/30/2021   10:06 AM 11/29/2020    9:50 AM 05/14/2020    3:26 PM 12/12/2019    3:11 PM 09/05/2019    3:58 PM  Advanced Directives  Does Patient Have a Medical Advance Directive? Yes Yes Yes Yes Yes Yes Yes  Type of AParamedicof [place]ountlake Terrace[place] will   [place] will  HG[place]Living will [place] will  Does patient want to make changes to medical advance directive?  No - Patient declined No - Patient declined No - Patient declined No - Patient declined No  - Patient declined No - Patient declined  Copy of [place]in Chart? No - copy requested   No - copy requested       Current Medications (verified) Outpatient Encounter Medications as of 02/13/2022  Medication Sig   acetaminophen (TYLENOL) 325 MG tablet Take 650 mg by mouth as needed.   lisinopril (ZESTRIL) 2.5 MG tablet TAKE 1 TABLET BY MOUTH DAILY.   No facility-administered encounter medications on file as of 02/13/2022.    Allergies (verified) Patient has no known allergies.   History: Past Medical History:  Diagnosis Date   History of radiation therapy 06/29/19-07/29/19   Right breast IMRT- Dr. KSondra Come  Hypertension    Past Surgical History:  Procedure Laterality Date   BREAST LUMPECTOMY WITH SENTINEL LYMPH NODE BIOPSY Right 05/17/2019   Procedure: RIGHT BREAST LUMPECTOMY WITH SENTINEL LYMPH NODE BIOPSY;  Surgeon: BCoralie Keens MD;  [facility]  Service: General;  Laterality: Right;   NO PAST SURGERIES     Family History  Problem Relation Age of Onset   Alzheimer's disease Mother    Heart disease Father    Alcohol abuse Father    Social History   Socioeconomic History   Marital status: Widowed    Spouse name: Not on file   Number of children: 3   Years of education: Not on file   Highest education level: 12th grade  Occupational History   Not on file  Tobacco Use   Smoking status: Every  Day    Packs/day: 0.33    Years: 30.00    Total pack years: 9.90    Types: Cigarettes    Passive exposure: Current   Smokeless tobacco: Never  Vaping Use   Vaping Use: Never used  Substance and Sexual Activity   Alcohol use: Yes    Comment: occasionally a glass wine   Drug use: Never   Sexual activity: Not on file  Other Topics Concern   Not on file  Social History Narrative   Not on file   Social Determinants of Health   Financial Resource Strain: Low Risk  (02/13/2022)   Overall Financial Resource Strain (CARDIA)     Difficulty of Paying Living Expenses: Not hard at all  Food Insecurity: No Food Insecurity (02/13/2022)   Hunger Vital Sign    Worried About Running Out of Food in the Last Year: Never true    Ran Out of Food in the Last Year: Never true  Transportation Needs: No Transportation Needs (02/13/2022)   PRAPARE - Hydrologist (Medical): No    Lack of Transportation (Non-Medical): No  Physical Activity: Inactive (02/13/2022)   Exercise Vital Sign    Days of Exercise per Week: 0 days    Minutes of Exercise per Session: 0 min  Stress: No Stress Concern Present (02/13/2022)   [place]    Feeling of Stress : Not at all  Social Connections: Not on file    Tobacco Counseling Ready to quit: Not Answered Counseling given: Not Answered   Clinical Intake:  Pre-visit preparation completed: Yes  Pain : No/denies pain     Nutritional Status: BMI of 19-24  Normal Nutritional Risks: None  How often do you need to have someone help you when you read instructions, pamphlets, or other written materials from your doctor or pharmacy?: 1 - Never  Diabetic? no  Interpreter Needed?: No  Information entered by :: NAllen LPN   Activities of Daily Living    02/13/2022    4:32 PM  In your present state of health, do you have any difficulty performing the following activities:  Hearing? 1  Comment has hearing aids  Vision? 0  Difficulty concentrating or making decisions? 0  Walking or climbing stairs? 0  Dressing or bathing? 0  Doing errands, shopping? 0  Preparing Food and eating ? N  Using the Toilet? N  In the past six months, have you accidently leaked urine? N  Do you have problems with loss of bowel control? N  Managing your Medications? N  Managing your Finances? N  Housekeeping or managing your Housekeeping? N    Patient Care Team: Dorna Mai, MD as PCP - General (Family  Medicine) Mauro Kaufmann, RN as Oncology Nurse Navigator Rockwell Germany, RN as Medical Oncologist Coralie Keens, MD as Consulting Physician (General Surgery) Truitt Merle, MD as Consulting Physician (Hematology) Gery Pray, MD as Consulting Physician (Radiation Oncology) Alla Feeling, NP as Nurse Practitioner (Nurse Practitioner)  Indicate any recent Medical Services you may have received from other than [facility] providers in the past year (date may be approximate).     Assessment:   This is a routine wellness examination for Barbara Green.  Hearing/Vision screen Vision Screening - Comments:: No regular exams, [facility]  Dietary issues and exercise activities discussed: Current Exercise Habits: The patient does not participate in regular exercise at present   Goals Addressed  This Visit's Progress    Patient Stated       02/13/2022, no goals       Depression Screen    02/13/2022    4:32 PM 07/31/2021    2:29 PM 05/23/2021    2:16 PM 04/11/2021    9:43 AM 03/26/2021    1:30 PM 04/30/2020    4:17 PM 10/19/2019    2:17 PM  PHQ 2/9 Scores  PHQ - 2 Score 0 0 0 0 0 0 0  PHQ- 9 Score   0 0 1      Fall Risk    02/13/2022    4:32 PM 07/31/2021    2:29 PM 04/30/2020    4:19 PM  [place] in the past year? 0 0 0  Number falls in past yr: 0 0 0  Injury with Fall? 0 0 0  Risk for fall due to : Medication side effect No Fall Risks   Follow up Falls prevention discussed;Education provided;Falls evaluation completed Falls evaluation completed Falls evaluation completed    FALL RISK PREVENTION PERTAINING TO THE HOME:  Any stairs in or around the home? Yes  If so, are there any without handrails? No  Home free of loose throw rugs in walkways, pet beds, electrical cords, etc? Yes  Adequate lighting in your home to reduce risk of falls? Yes   ASSISTIVE DEVICES UTILIZED TO PREVENT FALLS:  Life alert? No  Use of a cane, walker or w/c? No  Grab bars in the  bathroom? Yes  Shower chair or bench in shower? Yes  Elevated toilet seat or a handicapped toilet? Yes   TIMED UP AND GO:  Was the test performed? No .      Cognitive Function:        02/13/2022    4:34 PM  6CIT Screen  What Year? 0 points  What month? 0 points  What time? 0 points  Count back from 20 0 points  Months in reverse 0 points  Repeat phrase 0 points  Total Score 0 points    Immunizations  There is no immunization history on file for this patient.  TDAP status: Due, Education has been provided regarding the importance of this vaccine. Advised may receive this vaccine at local pharmacy or Health Dept. Aware to provide a copy of the vaccination record if obtained from local pharmacy or Health Dept. Verbalized acceptance and understanding.  Flu Vaccine status: Declined, Education has been provided regarding the importance of this vaccine but patient still declined. Advised may receive this vaccine at local pharmacy or Health Dept. Aware to provide a copy of the vaccination record if obtained from local pharmacy or Health Dept. Verbalized acceptance and understanding.  Pneumococcal vaccine status: Declined,  Education has been provided regarding the importance of this vaccine but patient still declined. Advised may receive this vaccine at local pharmacy or Health Dept. Aware to provide a copy of the vaccination record if obtained from local pharmacy or Health Dept. Verbalized acceptance and understanding.   Covid-19 vaccine status: Declined, Education has been provided regarding the importance of this vaccine but patient still declined. Advised may receive this vaccine at local pharmacy or Health Dept.or vaccine clinic. Aware to provide a copy of the vaccination record if obtained from local pharmacy or Health Dept. Verbalized acceptance and understanding.  Qualifies for Shingles Vaccine? Yes   Zostavax completed No   Shingrix Completed?: No.    Education has been  provided regarding the  importance of this vaccine. Patient has been advised to call insurance company to determine out of pocket expense if they have not yet received this vaccine. Advised may also receive vaccine at local pharmacy or Health Dept. Verbalized acceptance and understanding.  Screening Tests Health Maintenance  Topic Date Due   DTaP/Tdap/Td (1 - Tdap) Never done   Zoster Vaccines- Shingrix (1 of 2) Never done   DEXA SCAN  Never done   INFLUENZA VACCINE  Never done   Pneumonia Vaccine 32+ Years old (1 - PCV) 09/10/2022 (Originally 09/05/1946)   Medicare Annual Wellness (AWV)  02/14/2023   HPV VACCINES  Aged Out   COVID-19 Vaccine  Discontinued    Health Maintenance  Health Maintenance Due  Topic Date Due   DTaP/Tdap/Td (1 - Tdap) Never done   Zoster Vaccines- Shingrix (1 of 2) Never done   DEXA SCAN  Never done   INFLUENZA VACCINE  Never done    Colorectal cancer screening: No longer required.   Mammogram status: Completed 12/11/2021. Repeat every year  Bone Density status: due  Lung Cancer Screening: (Low Dose CT Chest recommended if Age 29-80 years, 30 pack-year currently smoking OR have quit w/in 15years.) does not qualify.   Lung Cancer Screening Referral: no  Additional Screening:  Hepatitis C Screening: does not qualify;   Vision Screening: Recommended annual ophthalmology exams for early detection of glaucoma and other disorders of the eye. Is the patient up to date with their annual eye exam?  Yes  Who is the provider or what is the name of the office in which the patient attends annual eye exams? [facility] If pt is not established with a provider, would they like to be referred to a provider to establish care? No .   Dental Screening: Recommended annual dental exams for proper oral hygiene  Community Resource Referral / Chronic Care Management: CRR required this visit?  No   CCM required this visit?  No      Plan:     I have personally  reviewed and noted the following in the patient's chart:   Medical and social history Use of alcohol, tobacco or illicit drugs  Current medications and supplements including opioid prescriptions. Patient is not currently taking opioid prescriptions. Functional ability and status Nutritional status Physical activity Advanced directives List of other physicians Hospitalizations, surgeries, and ER visits in previous 12 months Vitals Screenings to include cognitive, depression, and falls Referrals and appointments  In addition, I have reviewed and discussed with patient certain preventive protocols, quality metrics, and best practice recommendations. A written personalized care plan for preventive services as well as general preventive health recommendations were provided to patient.     Barbara Simmering, LPN   [id number]/38/[id number]   Nurse Notes: none  Due to this being a virtual visit, the after visit summary with patients personalized plan was offered to patient via mail or my-chart. Patient would like to access on my-chart

## 2022-02-18 ENCOUNTER — Ambulatory Visit (INDEPENDENT_AMBULATORY_CARE_PROVIDER_SITE_OTHER): Payer: Medicare HMO | Admitting: Family Medicine

## 2022-02-18 ENCOUNTER — Encounter: Payer: Self-pay | Admitting: Family Medicine

## 2022-02-18 VITALS — BP 146/82 | HR 98 | Temp 97.6°F | Resp 16 | Wt 107.4 lb

## 2022-02-18 DIAGNOSIS — I10 Essential (primary) hypertension: Secondary | ICD-10-CM | POA: Diagnosis not present

## 2022-02-18 DIAGNOSIS — F17200 Nicotine dependence, unspecified, uncomplicated: Secondary | ICD-10-CM | POA: Diagnosis not present

## 2022-02-18 MED ORDER — LISINOPRIL 5 MG PO TABS: 5.0000 mg | ORAL_TABLET | Freq: Every day | ORAL | 1 refills | Status: DC

## 2022-02-18 NOTE — Progress Notes (Unsigned)
Patient is here for their 6 month follow-up Patient has no concerns today Care gaps have been discussed with patient  

## 2022-02-19 ENCOUNTER — Encounter: Payer: Self-pay | Admitting: Family Medicine

## 2022-02-19 NOTE — Progress Notes (Signed)
Established Patient Office Visit  Subjective    Patient ID: Barbara Green, female    DOB: [birth year]  Age: 82 y.o. MRN: [id number]  CC:  Chief Complaint  Patient presents with   Follow-up   Hypertension    HPI Barbara Green presents for follow up of hypertension. Patient denies acute complaints or concerns.    Outpatient Encounter Medications as of 02/18/2022  Medication Sig   acetaminophen (TYLENOL) 325 MG tablet Take 650 mg by mouth as needed.   lisinopril (ZESTRIL) 2.5 MG tablet TAKE 1 TABLET BY MOUTH DAILY.   lisinopril (ZESTRIL) 5 MG tablet Take 1 tablet (5 mg total) by mouth daily.   No facility-administered encounter medications on file as of 02/18/2022.    Past Medical History:  Diagnosis Date   History of radiation therapy 06/29/19-07/29/19   Right breast IMRT- Dr. Sondra Come   Hypertension     Past Surgical History:  Procedure Laterality Date   BREAST LUMPECTOMY WITH SENTINEL LYMPH NODE BIOPSY Right 05/17/2019   Procedure: RIGHT BREAST LUMPECTOMY WITH SENTINEL LYMPH NODE BIOPSY;  Surgeon: Coralie Keens, MD;  Location: [facility];  Service: General;  Laterality: Right;   NO PAST SURGERIES      Family History  Problem Relation Age of Onset   Alzheimer's disease Mother    Heart disease Father    Alcohol abuse Father     Social History   Socioeconomic History   Marital status: Widowed    Spouse name: Not on file   Number of children: 3   Years of education: Not on file   Highest education level: 12th grade  Occupational History   Not on file  Tobacco Use   Smoking status: Every Day    Packs/day: 0.33    Years: 30.00    Total pack years: 9.90    Types: Cigarettes    Passive exposure: Current   Smokeless tobacco: Never  Vaping Use   Vaping Use: Never used  Substance and Sexual Activity   Alcohol use: Yes    Comment: occasionally a glass wine   Drug use: Never   Sexual activity: Not on file  Other Topics Concern   Not on file   Social History Narrative   Not on file   Social Determinants of Health   Financial Resource Strain: Low Risk  (02/13/2022)   Overall Financial Resource Strain (CARDIA)    Difficulty of Paying Living Expenses: Not hard at all  Food Insecurity: No Food Insecurity (02/13/2022)   Hunger Vital Sign    Worried About Running Out of Food in the Last Year: Never true    Ran Out of Food in the Last Year: Never true  Transportation Needs: No Transportation Needs (02/13/2022)   PRAPARE - Hydrologist (Medical): No    Lack of Transportation (Non-Medical): No  Physical Activity: Inactive (02/13/2022)   Exercise Vital Sign    Days of Exercise per Week: 0 days    Minutes of Exercise per Session: 0 min  Stress: No Stress Concern Present (02/13/2022)   [place]    Feeling of Stress : Not at all  Social Connections: Not on file  Intimate Partner Violence: Not on file    Review of Systems  All other systems reviewed and are negative.       Objective    BP (!) 146/82   Pulse 98   Temp 97.6 F (36.4  C) (Oral)   Resp 16   Wt 107 lb 6.4 oz (48.7 kg)   SpO2 95%   BMI 20.98 kg/m   Physical Exam Vitals and nursing note reviewed.  Constitutional:      General: She is not in acute distress. Cardiovascular:     Rate and Rhythm: Normal rate and regular rhythm.  Pulmonary:     Effort: Pulmonary effort is normal.     Breath sounds: Normal breath sounds.  Abdominal:     Palpations: Abdomen is soft.     Tenderness: There is no abdominal tenderness.  Neurological:     General: No focal deficit present.     Mental Status: She is alert and oriented to person, place, and time.         Assessment & Plan:   1. Essential hypertension Slightly elevated readings. Will increase lisinopril form 2.5 to '5mg'$  daily  2. Tobacco dependence Discussed reduction/cessation  Return in about 6 months (around  08/19/2022) for follow up.   Becky Sax, MD

## 2022-05-21 ENCOUNTER — Ambulatory Visit: Payer: Self-pay

## 2022-05-21 NOTE — Telephone Encounter (Signed)
Patient called, left VM to return the call to the office to discuss symptoms with a nurse.   Summary: Dizziness   Patient states that she has been having dizzy spells for over a year. Patient states that provider keeps changing bp medication dose and it does not seem to be helping. Patient is scheduled to see provider next available on 5/21 for a bp check but would like to know what to do about the dizziness.

## 2022-05-21 NOTE — Telephone Encounter (Signed)
Reason for Disposition  [1] MODERATE dizziness (e.g., interferes with normal activities) AND [2] has been evaluated by doctor (or NP/PA) for this  Answer Assessment - Initial Assessment Questions 1. DESCRIPTION: "Describe your dizziness."     I'm having dizziness for over a year.   Dr. Andrey Campanile has changed my BP medicine several times.   The lisinopril for 90 pills I just completed but the dizzy spells are not continious.    In the morning when I get up I take my BP medicine and am drinking plenty of water.   When I start moving around a lot I feel lightheaded.   I have to stop and hold onto something. 2. LIGHTHEADED: "Do you feel lightheaded?" (e.g., somewhat faint, woozy, weak upon standing)     Yes  I take Bovine for the dizziness which is helping some.   It's for motion sickness and can get it over the counter.    It helps. It's unnerving.   I don't want to fall.   3. VERTIGO: "Do you feel like either you or the room is spinning or tilting?" (i.e. vertigo)     No 4. SEVERITY: "How bad is it?"  "Do you feel like you are going to faint?" "Can you stand and walk?"   - MILD: Feels slightly dizzy, but walking normally.   - MODERATE: Feels unsteady when walking, but not falling; interferes with normal activities (e.g., school, work).   - SEVERE: Unable to walk without falling, or requires assistance to walk without falling; feels like passing out now.      Moderate.   It's not all the time.   Just when I'm moving around.   I have to walk slow and be careful. 5. ONSET:  "When did the dizziness begin?"     For about a year.   I talked with Dr. Andrey Campanile and she has been changing my BP medicine.  The dizziness is happening more and more.6. AGGRAVATING FACTORS: "Does anything make it worse?" (e.g., standing, change in head position)     Moving around. 7. HEART RATE: "Can you tell me your heart rate?" "How many beats in 15 seconds?"  (Note: not all patients can do this)       Not asked 8. CAUSE: "What do  you think is causing the dizziness?"     Maybe the BP medicine. 9. RECURRENT SYMPTOM: "Have you had dizziness before?" If Yes, ask: "When was the last time?" "What happened that time?"     No  10. OTHER SYMPTOMS: "Do you have any other symptoms?" (e.g., fever, chest pain, vomiting, diarrhea, bleeding)       No headaches, shortness of breath or chest pain.    11. PREGNANCY: "Is there any chance you are pregnant?" "When was your last menstrual period?"       N/A due to age  Protocols used: Dizziness - Lightheadedness-A-AH  Chief Complaint: Dizziness for over a year.   Thinks it's from BP medicine but the dizziness is getting worse. Symptoms: Moving around makes the dizziness worse.   Has to hold onto something. Frequency: Intermittently but becoming more frequent. Pertinent Negatives: Patient denies passing out, headaches, shortness of breath or chest pain. Disposition: ED /[] Urgent Care (no appt availability in office) / Appointment(In office/virtual)/  [facility]/ Home Care/ Refused Recommended Disposition /[] [facility]/  Follow-up with PCP Additional Notes: Has an appt for 06/17/2022 with Dr. Andrey Campanile.  That's the soonest appt. Available.   I'm putting  her on the wait list in case of a cancellation and also sending a message to Dr Andrey Campanile to see if she can be seen sooner. If the dizziness gets worse she's going to come to the [facility] at [facility].

## 2022-05-22 NOTE — Telephone Encounter (Signed)
I have attempted without success to contact this patient by phone to return their call and I left a message on answering machine.

## 2022-05-26 ENCOUNTER — Other Ambulatory Visit: Payer: Self-pay

## 2022-05-26 ENCOUNTER — Ambulatory Visit
Admission: EM | Admit: 2022-05-26 | Discharge: 2022-05-26 | Disposition: A | Discharge status: Home / Self Care | Payer: Medicare HMO | Attending: Internal Medicine | Admitting: Internal Medicine

## 2022-05-26 DIAGNOSIS — R42 Dizziness and giddiness: Secondary | ICD-10-CM | POA: Diagnosis not present

## 2022-05-26 LAB — POCT URINALYSIS DIP (MANUAL ENTRY)
Bilirubin, UA: NEGATIVE
Glucose, UA: NEGATIVE mg/dL
Nitrite, UA: NEGATIVE
Spec Grav, UA: 1.025 (ref 1.010–1.025)
Urobilinogen, UA: 0.2 E.U./dL
pH, UA: 6.5 (ref 5.0–8.0)

## 2022-05-26 NOTE — ED Triage Notes (Signed)
Pt c/o left ear problem and vertigo onset ~ 1 year ago.

## 2022-05-26 NOTE — ED Provider Notes (Signed)
[name]    CSN: [id number] Arrival date & time: 05/26/22  1120      History   Chief Complaint Chief Complaint  Patient presents with   left ear problem    HPI Barbara Green is a 82 y.o. female.   Patient presents with dizziness that has been present for over a year.  Patient reports that it feels like she is "staggering" at times.  She states that it mainly occurs when she is bent over.  She states that she is very active and this is causing limitations in her activity.  Denies any associated headache, blurred vision, chest pain, shortness of breath.  She reports that she has spoke with her PCP about the symptoms and they thought it was related to her blood pressure medications so these medication changes were made with no improvement of symptoms.  She denies recent falls or head trauma.  Patient was concerned today that symptoms could be related to ear abnormality but she denies any ear discomfort or pain. Reports that symptoms have appeared to be worsening over the past 3 weeks.      Past Medical History:  Diagnosis Date   History of radiation therapy 06/29/19-07/29/19   Right breast IMRT- Dr. Roselind Messier   Hypertension     Patient Active Problem List   Diagnosis Date Noted   Malignant neoplasm of upper-inner quadrant of right breast in female, estrogen receptor positive (HCC) 04/20/2019   Tobacco dependence 02/08/2019   Unspecified lump in the right breast, upper inner quadrant  02/08/2019   Essential hypertension 02/08/2019   Tachycardia 02/08/2019    Past Surgical History:  Procedure Laterality Date   BREAST LUMPECTOMY WITH SENTINEL LYMPH NODE BIOPSY Right 05/17/2019   Procedure: RIGHT BREAST LUMPECTOMY WITH SENTINEL LYMPH NODE BIOPSY;  Surgeon: Abigail Miyamoto, MD;  Location: [facility];  Service: General;  Laterality: Right;   NO PAST SURGERIES      OB History   No obstetric history on file.      Home Medications    Prior to  Admission medications   Medication Sig Start Date End Date Taking? Authorizing Provider  acetaminophen (TYLENOL) 325 MG tablet Take 650 mg by mouth as needed.    [provider]  lisinopril (ZESTRIL) 2.5 MG tablet TAKE 1 TABLET BY MOUTH DAILY. 01/21/22   Rema Fendt, NP  lisinopril (ZESTRIL) 5 MG tablet Take 1 tablet (5 mg total) by mouth daily. 02/18/22   [name], MD    Family History Family History  Problem Relation Age of Onset   Alzheimer's disease Mother    Heart disease Father    Alcohol abuse Father     Social History Social History   Tobacco Use   Smoking status: Every Day    Packs/day: 0.33    Years: 30.00    Additional pack years: 0.00    Total pack years: 9.90    Types: Cigarettes    Passive exposure: Current   Smokeless tobacco: Never  Vaping Use   Vaping Use: Never used  Substance Use Topics   Alcohol use: Yes    Comment: occasionally a glass wine   Drug use: Never     Allergies   Patient has no known allergies.   Review of Systems Review of Systems Per HPI  Physical Exam Triage Vital Signs ED Triage Vitals  Enc Vitals Group     BP 05/26/22 1230 (!) 141/86     Pulse Rate 05/26/22 1230  90     Resp 05/26/22 1230 16     Temp 05/26/22 1230 97.8 F (36.6 C)     Temp Source 05/26/22 1230 Oral     SpO2 05/26/22 1230 95 %     Weight --      Height --      Head Circumference --      Peak Flow --      Pain Score 05/26/22 1231 0     Pain Loc --      Pain Edu? --      Excl. in GC? --    Orthostatic VS for the past 24 hrs:  BP- Lying Pulse- Lying BP- Sitting Pulse- Sitting BP- Standing at 0 minutes Pulse- Standing at 0 minutes  05/26/22 1251 161/80 89 (!) 165/91 86 148/82 92    Updated Vital Signs BP (!) 141/86 (BP Location: Right Arm)   Pulse 90   Temp 97.8 F (36.6 C) (Oral)   Resp 16   SpO2 95%   Visual Acuity Right Eye Distance:   Left Eye Distance:   Bilateral Distance:    Right Eye Near:   Left Eye Near:     Bilateral Near:     Physical Exam Constitutional:      General: She is not in acute distress.    Appearance: Normal appearance. She is not toxic-appearing or diaphoretic.  HENT:     Head: Normocephalic and atraumatic.     Right Ear: Tympanic membrane and ear canal normal.     Left Ear: Tympanic membrane and ear canal normal.  Eyes:     Extraocular Movements: Extraocular movements intact.     Conjunctiva/sclera: Conjunctivae normal.     Pupils: Pupils are equal, round, and reactive to light.  Cardiovascular:     Rate and Rhythm: Normal rate and regular rhythm.     Pulses: Normal pulses.     Heart sounds: Normal heart sounds.  Pulmonary:     Effort: Pulmonary effort is normal. No respiratory distress.     Breath sounds: Normal breath sounds.  Neurological:     General: No focal deficit present.     Mental Status: She is alert and oriented to person, place, and time. Mental status is at baseline.     Cranial Nerves: Cranial nerves 2-12 are intact.     Sensory: Sensation is intact.     Motor: Motor function is intact.     Coordination: Coordination is intact.     Gait: Gait is intact.  Psychiatric:        Mood and Affect: Mood normal.        Behavior: Behavior normal.        Thought Content: Thought content normal.        Judgment: Judgment normal.      UC Treatments / Results  Labs (all labs ordered are listed, but only abnormal results are displayed) Labs Reviewed  POCT URINALYSIS DIP (MANUAL ENTRY) - Abnormal; Notable for the following components:      Result Value   Ketones, POC UA moderate (40) (*)    Blood, UA trace-intact (*)    Protein Ur, POC trace (*)    Leukocytes, UA Trace (*)    All other components within normal limits  CBC  COMPREHENSIVE METABOLIC PANEL    EKG   Radiology No results found.  Procedures Procedures (including critical care time)  Medications Ordered in UC Medications - No data to display  Initial Impression / Assessment and  Plan / UC Course  I have reviewed the triage vital signs and the nursing notes.  Pertinent labs & imaging results that were available during my care of the patient were reviewed by me and considered in my medical decision making (see chart for details).     Physical exam and neuroexam are normal which is reassuring that the patient does not need any emergent evaluation.  Patient had a mild drop in blood pressure on orthostatic vital signs but heart rate stayed stable so unsure if this is the cause of symptoms. Orthostatic vital signs are as followed per nursing report:   Orthostatic LyingBP- Lying: 161/80  Pulse- Lying: 89 Orthostatic SittingBP- Sitting: 165/91 Pulse- Sitting: 86 Orthostatic Standing at 0 minutesBP- Standing at 0 minutes: 148/82 Pulse- Standing at 0 minutes: 92 Orthostatic Standing at 3 minutesBP- Standing at 3 minutes: 142/89  Given that symptoms have been present for over a year, I do think that follow-up with PCP is reasonable.  ENT specialty referral was placed as well to ensure that patient's symptoms are not related to any ear abnormalities.  Advised patient that if they do not call her in the next 48 hours, she is to call them herself to schedule the appointment.  UA was unremarkable.  It does show trace leukocytes but patient is not complaining of any urinary symptoms so urine culture was deferred.  CMP and CBC pending to ensure no worrisome etiology is present.  Encouraged follow-up with PCP and patient reports she has an appointment on 5/21 for follow-up.  Discussed strict return and ER precautions.  Patient verbalized understanding and was agreeable with plan. Final Clinical Impressions(s) / UC Diagnoses   Final diagnoses:  Dizziness and giddiness     Discharge Instructions      Please follow-up with family medicine doctor and ear specialist for further evaluation and management.  I have placed a referral to ear specialist.  If they do not call within 48  hours, please call them at scheduled appointment.  Urine was unremarkable.  Will obtain blood work to ensure any abnormalities are not present.    ED Prescriptions   None    PDMP not reviewed this encounter.   Gustavus Bryant, [place] 05/26/22 1326

## 2022-05-26 NOTE — Discharge Instructions (Signed)
Please follow-up with family medicine doctor and ear specialist for further evaluation and management.  I have placed a referral to ear specialist.  If they do not call within 48 hours, please call them at scheduled appointment.  Urine was unremarkable.  Will obtain blood work to ensure any abnormalities are not present.

## 2022-05-27 ENCOUNTER — Telehealth (HOSPITAL_COMMUNITY): Payer: Self-pay | Admitting: Emergency Medicine

## 2022-05-27 LAB — CBC
Hematocrit: 43 % (ref 34.0–46.6)
Hemoglobin: 14.4 g/dL (ref 11.1–15.9)
MCH: 29.6 pg (ref 26.6–33.0)
MCHC: 33.5 g/dL (ref 31.5–35.7)
MCV: 88 fL (ref 79–97)
Platelets: 418 10*3/uL (ref 150–450)
RBC: 4.87 x10E6/uL (ref 3.77–5.28)
RDW: 12.2 % (ref 11.7–15.4)
WBC: 10 10*3/uL (ref 3.4–10.8)

## 2022-05-27 LAB — COMPREHENSIVE METABOLIC PANEL
ALT: 12 IU/L (ref 0–32)
AST: 20 IU/L (ref 0–40)
Albumin/Globulin Ratio: 1.8 (ref 1.2–2.2)
Albumin: 4.4 g/dL (ref 3.8–4.8)
Alkaline Phosphatase: 141 IU/L — ABNORMAL HIGH (ref 44–121)
BUN/Creatinine Ratio: 19 (ref 12–28)
BUN: 7 mg/dL — ABNORMAL LOW (ref 8–27)
Bilirubin Total: 0.6 mg/dL (ref 0.0–1.2)
CO2: 18 mmol/L — ABNORMAL LOW (ref 20–29)
Calcium: 10 mg/dL (ref 8.7–10.3)
Chloride: 86 mmol/L — ABNORMAL LOW (ref 96–106)
Creatinine, Ser: 0.37 mg/dL — ABNORMAL LOW (ref 0.57–1.00)
Globulin, Total: 2.5 g/dL (ref 1.5–4.5)
Glucose: 95 mg/dL (ref 70–99)
Potassium: 5.3 mmol/L — ABNORMAL HIGH (ref 3.5–5.2)
Sodium: 124 mmol/L — ABNORMAL LOW (ref 134–144)
Total Protein: 6.9 g/dL (ref 6.0–8.5)
eGFR: 102 mL/min/{1.73_m2} (ref >59)

## 2022-05-27 NOTE — Telephone Encounter (Signed)
Patient is being sent post-discharge from the Urgent Care and sent to the Emergency Department via private vehicle . Per Rolly Salter, APP, patient is in need of higher level of care due to abnormal labs and dizziness. Patient is aware and verbalizes understanding of plan of care.

## 2022-05-28 ENCOUNTER — Emergency Department (HOSPITAL_BASED_OUTPATIENT_CLINIC_OR_DEPARTMENT_OTHER)
Admission: EM | Admit: 2022-05-28 | Discharge: 2022-05-28 | Discharge status: Against Medical Advice | Payer: Medicare HMO | Source: Home / Self Care | Attending: Emergency Medicine | Admitting: Emergency Medicine

## 2022-05-28 ENCOUNTER — Encounter (HOSPITAL_BASED_OUTPATIENT_CLINIC_OR_DEPARTMENT_OTHER): Payer: Self-pay | Admitting: Emergency Medicine

## 2022-05-28 ENCOUNTER — Other Ambulatory Visit: Payer: Self-pay

## 2022-05-28 DIAGNOSIS — E871 Hypo-osmolality and hyponatremia: Secondary | ICD-10-CM | POA: Diagnosis not present

## 2022-05-28 DIAGNOSIS — R110 Nausea: Secondary | ICD-10-CM | POA: Insufficient documentation

## 2022-05-28 DIAGNOSIS — Z5321 Procedure and treatment not carried out due to patient leaving prior to being seen by health care provider: Secondary | ICD-10-CM | POA: Insufficient documentation

## 2022-05-28 DIAGNOSIS — R42 Dizziness and giddiness: Secondary | ICD-10-CM | POA: Insufficient documentation

## 2022-05-28 LAB — COMPREHENSIVE METABOLIC PANEL
ALT: 8 U/L (ref 0–44)
AST: 15 U/L (ref 15–41)
Albumin: 4.4 g/dL (ref 3.5–5.0)
Alkaline Phosphatase: 115 U/L (ref 38–126)
Anion gap: 10 (ref 5–15)
BUN: 5 mg/dL — ABNORMAL LOW (ref 8–23)
CO2: 26 mmol/L (ref 22–32)
Calcium: 9.5 mg/dL (ref 8.9–10.3)
Chloride: 85 mmol/L — ABNORMAL LOW (ref 98–111)
Creatinine, Ser: 0.3 mg/dL — ABNORMAL LOW (ref 0.44–1.00)
GFR, Estimated: >60 mL/min (ref >60)
Glucose, Bld: 104 mg/dL — ABNORMAL HIGH (ref 70–99)
Potassium: 3.9 mmol/L (ref 3.5–5.1)
Sodium: 121 mmol/L — ABNORMAL LOW (ref 135–145)
Total Bilirubin: 0.7 mg/dL (ref 0.3–1.2)
Total Protein: 7.3 g/dL (ref 6.5–8.1)

## 2022-05-28 LAB — CBC
HCT: 42.6 % (ref 36.0–46.0)
Hemoglobin: 15 g/dL (ref 12.0–15.0)
MCH: 30.1 pg (ref 26.0–34.0)
MCHC: 35.2 g/dL (ref 30.0–36.0)
MCV: 85.4 fL (ref 80.0–100.0)
Platelets: 433 10*3/uL — ABNORMAL HIGH (ref 150–400)
RBC: 4.99 MIL/uL (ref 3.87–5.11)
RDW: 12.9 % (ref 11.5–15.5)
WBC: 9.9 10*3/uL (ref 4.0–10.5)
nRBC: 0 % (ref 0.0–0.2)

## 2022-05-28 LAB — MAGNESIUM: Magnesium: 1.7 mg/dL (ref 1.7–2.4)

## 2022-05-28 LAB — PHOSPHORUS: Phosphorus: 3.4 mg/dL (ref 2.5–4.6)

## 2022-05-28 MED ORDER — SODIUM CHLORIDE 0.9 % IV SOLN: Freq: Once | INTRAVENOUS | Status: AC

## 2022-05-28 NOTE — ED Provider Notes (Signed)
[place] EMERGENCY DEPARTMENT AT [facility] Provider Note   CSN: [id number] Arrival date & time: 05/28/22  1018     History  Chief Complaint  Patient presents with   Abnormal Lab    ASALEE SYMINGTON is a 82 y.o. female.  HPI Patient is sent for low sodium.  Patient denies she is having much for symptoms.  She reports she feels a little dizzy when she turns and stands up or moves.  She denies she is having headache, chest pain, shortness of breath.  No fall no syncope.    Home Medications Prior to Admission medications   Medication Sig Start Date End Date Taking? Authorizing Provider  acetaminophen (TYLENOL) 325 MG tablet Take 650 mg by mouth as needed.    [provider]  lisinopril (ZESTRIL) 2.5 MG tablet TAKE 1 TABLET BY MOUTH DAILY. 01/21/22   Rema Fendt, NP  lisinopril (ZESTRIL) 5 MG tablet Take 1 tablet (5 mg total) by mouth daily. 02/18/22   [name], MD      Allergies    Patient has no known allergies.    Review of Systems   Review of Systems  Physical Exam Updated Vital Signs BP (!) 150/87 (BP Location: Right Arm)   Pulse 96   Temp 97.9 F (36.6 C) (Oral)   Resp (!) 21   Wt 45.4 kg   SpO2 99%   BMI 19.53 kg/m  Physical Exam Constitutional:      Comments: Alert nontoxic well in appearance.  Well-nourished well-developed.  HENT:     Head: Normocephalic and atraumatic.     Mouth/Throat:     Mouth: Mucous membranes are moist.     Pharynx: Oropharynx is clear.  Eyes:     Extraocular Movements: Extraocular movements intact.     Pupils: Pupils are equal, round, and reactive to light.  Cardiovascular:     Rate and Rhythm: Normal rate and regular rhythm.  Pulmonary:     Effort: Pulmonary effort is normal.     Comments: No respiratory distress.  Lungs grossly clear.  Faint crackle at the left base. Abdominal:     General: There is no distension.     Palpations: Abdomen is soft.     Tenderness: There is no abdominal  tenderness. There is no guarding.  Musculoskeletal:        General: No swelling or tenderness. Normal range of motion.     Right lower leg: No edema.     Left lower leg: No edema.     Comments: Extremities in good condition.  No peripheral edema.  Skin:    General: Skin is warm and dry.  Neurological:     General: No focal deficit present.     Mental Status: She is oriented to person, place, and time.     Cranial Nerves: No cranial nerve deficit.     Motor: No weakness.     Coordination: Coordination normal.     ED Results / Procedures / Treatments   Labs (all labs ordered are listed, but only abnormal results are displayed) Labs Reviewed  CBC - Abnormal; Notable for the following components:      Result Value   Platelets 433 (*)    All other components within normal limits  COMPREHENSIVE METABOLIC PANEL - Abnormal; Notable for the following components:   Sodium 121 (*)    Chloride 85 (*)    Glucose, Bld 104 (*)    BUN 5 (*)    Creatinine,  Ser 0.30 (*)    All other components within normal limits  MAGNESIUM  PHOSPHORUS    EKG None  Radiology No results found.  Procedures Procedures    Medications Ordered in ED Medications  0.9 %  sodium chloride infusion ( Intravenous New Bag/Given 05/28/22 1312)    ED Course/ Medical Decision Making/ A&P                             Medical Decision Making Amount and/or Complexity of Data Reviewed Labs: ordered.  Risk Prescription drug management.  Patient was referred to the emergency department for hyponatremia.  Patient is denying any symptoms.  She reports she feels really well.  Patient's repeat sodium in the emergency department is 121, GFR normal.  CBC no leukocytosis and no anemia.  Magnesium 1.7.  Normal saline infusion initiated for sodium replacement.  Patient is clinically well and at this time I do not feel she needs hypertonic sodium.  Discussed the need for admission due to hyponatremia with risks of  confusion, fall and seizure.  Patient is adamant that she does not need to be admitted at this time and has other things that she must do.  Extensive discussion with the patient's daughter who is the primary contact is listed.  Advised patient is refusing to stay or accept further treatment.  Daughter advises that this is typical for the patient.  I phoned into the room for the daughter to discuss need for admission with the patient.  Patient was later more adamant and insistent that she be discharged.  Mental status is clear.  She is oriented and shows no signs of confusion.  He has no neurologic deficits.  At this time will have to sign out AGAINST MEDICAL ADVICE.       Final Clinical Impression(s) / ED Diagnoses Final diagnoses:  Hyponatremia    Rx / DC Orders ED Discharge Orders     None         Arby Barrette, MD 06/09/22 2006

## 2022-05-28 NOTE — ED Notes (Addendum)
Spoke with patient re: admit. Pt is adamant that she does not want to be transferred without being taken from her home and not transferred from this ED. Concerns she has is leaving car here and not having anyone with her. Tried to overcome these objections by offering to call family and pt did not want this RN to contact family. Explained that her lab work was a critical low and pt states that she feels fine and that the delay was her doing. Then explained that pt would be leaving AMA and that if she went to hospital after d/c her care would discontinue and have to start over. PT states that "it doesn't matter to me, I need to go home first". Info. Provided to EDP Pfeiffer.

## 2022-05-28 NOTE — ED Triage Notes (Signed)
Pt arrives pov, endorses referral from UC for abnormal labs r/t Na+. Pt reports "vertigo" with quick movement. AOx4, bilaterally equal, speech clear. VAN neg

## 2022-05-28 NOTE — Discharge Instructions (Signed)
1.  Limit your intake of free water to 24 ounces a day.  You may continue to drink other beverages that have electrolytes in them. 2.  You may drink Gatorade or sports drinks, juices, coffee and tea. 3.  See your doctor or return to emergency department as soon as possible if you develop any additional symptoms of dizziness, lightheadedness, weakness, incoordination or confusion. 4.  You are leaving AGAINST MEDICAL ADVICE.  There is a risk of seizure, fall due to imbalance or other significant medical problems due to low sodium that could result in death or permanent disability.

## 2022-05-29 ENCOUNTER — Inpatient Hospital Stay (HOSPITAL_COMMUNITY): Payer: Medicare HMO

## 2022-05-29 ENCOUNTER — Other Ambulatory Visit: Payer: Self-pay | Admitting: Emergency Medicine

## 2022-05-29 ENCOUNTER — Emergency Department (HOSPITAL_COMMUNITY): Payer: Medicare HMO

## 2022-05-29 ENCOUNTER — Encounter (HOSPITAL_COMMUNITY): Payer: Self-pay

## 2022-05-29 ENCOUNTER — Inpatient Hospital Stay (HOSPITAL_COMMUNITY)
Admission: EM | Admit: 2022-05-29 | Discharge: 2022-05-30 | DRG: 641 | Disposition: A | Discharge status: Home / Self Care | Payer: Medicare HMO | Attending: Internal Medicine | Admitting: Internal Medicine

## 2022-05-29 ENCOUNTER — Encounter: Payer: Self-pay | Admitting: Emergency Medicine

## 2022-05-29 DIAGNOSIS — Z66 Do not resuscitate: Secondary | ICD-10-CM | POA: Diagnosis present

## 2022-05-29 DIAGNOSIS — E871 Hypo-osmolality and hyponatremia: Principal | ICD-10-CM | POA: Diagnosis present

## 2022-05-29 DIAGNOSIS — I10 Essential (primary) hypertension: Secondary | ICD-10-CM | POA: Diagnosis not present

## 2022-05-29 DIAGNOSIS — Z820 Family history of epilepsy and other diseases of the nervous system: Secondary | ICD-10-CM | POA: Diagnosis not present

## 2022-05-29 DIAGNOSIS — Z853 Personal history of malignant neoplasm of breast: Secondary | ICD-10-CM | POA: Diagnosis not present

## 2022-05-29 DIAGNOSIS — F17210 Nicotine dependence, cigarettes, uncomplicated: Secondary | ICD-10-CM | POA: Diagnosis present

## 2022-05-29 DIAGNOSIS — F17200 Nicotine dependence, unspecified, uncomplicated: Secondary | ICD-10-CM | POA: Diagnosis present

## 2022-05-29 DIAGNOSIS — Z923 Personal history of irradiation: Secondary | ICD-10-CM

## 2022-05-29 DIAGNOSIS — Z79899 Other long term (current) drug therapy: Secondary | ICD-10-CM | POA: Diagnosis not present

## 2022-05-29 DIAGNOSIS — Z170 Estrogen receptor positive status [ER+]: Secondary | ICD-10-CM | POA: Diagnosis not present

## 2022-05-29 DIAGNOSIS — Z811 Family history of alcohol abuse and dependence: Secondary | ICD-10-CM | POA: Diagnosis not present

## 2022-05-29 DIAGNOSIS — R918 Other nonspecific abnormal finding of lung field: Secondary | ICD-10-CM | POA: Diagnosis not present

## 2022-05-29 DIAGNOSIS — Z8249 Family history of ischemic heart disease and other diseases of the circulatory system: Secondary | ICD-10-CM | POA: Diagnosis not present

## 2022-05-29 DIAGNOSIS — J439 Emphysema, unspecified: Secondary | ICD-10-CM | POA: Diagnosis present

## 2022-05-29 DIAGNOSIS — R590 Localized enlarged lymph nodes: Secondary | ICD-10-CM

## 2022-05-29 DIAGNOSIS — J984 Other disorders of lung: Secondary | ICD-10-CM

## 2022-05-29 DIAGNOSIS — R42 Dizziness and giddiness: Principal | ICD-10-CM

## 2022-05-29 LAB — URINALYSIS, ROUTINE W REFLEX MICROSCOPIC
Bilirubin Urine: NEGATIVE
Glucose, UA: NEGATIVE mg/dL
Ketones, ur: 20 mg/dL — AB
Nitrite: NEGATIVE
Protein, ur: 30 mg/dL — AB
Specific Gravity, Urine: 1.014 (ref 1.005–1.030)
pH: 6 (ref 5.0–8.0)

## 2022-05-29 LAB — OSMOLALITY, URINE: Osmolality, Ur: 257 mOsm/kg — ABNORMAL LOW (ref 300–900)

## 2022-05-29 LAB — CBC
HCT: 45.2 % (ref 36.0–46.0)
Hemoglobin: 15.2 g/dL — ABNORMAL HIGH (ref 12.0–15.0)
MCH: 29.1 pg (ref 26.0–34.0)
MCHC: 33.6 g/dL (ref 30.0–36.0)
MCV: 86.6 fL (ref 80.0–100.0)
Platelets: 463 10*3/uL — ABNORMAL HIGH (ref 150–400)
RBC: 5.22 MIL/uL — ABNORMAL HIGH (ref 3.87–5.11)
RDW: 13 % (ref 11.5–15.5)
WBC: 9.9 10*3/uL (ref 4.0–10.5)
nRBC: 0 % (ref 0.0–0.2)

## 2022-05-29 LAB — NA AND K (SODIUM & POTASSIUM), RAND UR
Potassium Urine: 22 mmol/L
Sodium, Ur: 74 mmol/L

## 2022-05-29 LAB — SODIUM: Sodium: 126 mmol/L — ABNORMAL LOW (ref 135–145)

## 2022-05-29 LAB — BASIC METABOLIC PANEL
Anion gap: 12 (ref 5–15)
BUN: 6 mg/dL — ABNORMAL LOW (ref 8–23)
CO2: 24 mmol/L (ref 22–32)
Calcium: 9.6 mg/dL (ref 8.9–10.3)
Chloride: 85 mmol/L — ABNORMAL LOW (ref 98–111)
Creatinine, Ser: 0.39 mg/dL — ABNORMAL LOW (ref 0.44–1.00)
GFR, Estimated: >60 mL/min (ref >60)
Glucose, Bld: 123 mg/dL — ABNORMAL HIGH (ref 70–99)
Potassium: 3.9 mmol/L (ref 3.5–5.1)
Sodium: 121 mmol/L — ABNORMAL LOW (ref 135–145)

## 2022-05-29 LAB — OSMOLALITY: Osmolality: 254 mOsm/kg — ABNORMAL LOW (ref 275–295)

## 2022-05-29 LAB — MAGNESIUM: Magnesium: 1.9 mg/dL (ref 1.7–2.4)

## 2022-05-29 MED ORDER — SODIUM CHLORIDE 0.9% FLUSH
3.0000 mL | Freq: Two times a day (BID) | INTRAVENOUS | Status: DC
  Administered 2022-05-29 – 2022-05-30 (×3): 3 mL via INTRAVENOUS

## 2022-05-29 MED ORDER — SODIUM CHLORIDE 0.9 % IV BOLUS
1000.0000 mL | Freq: Once | INTRAVENOUS | Status: AC
  Administered 2022-05-29: 1000 mL via INTRAVENOUS

## 2022-05-29 MED ORDER — ENOXAPARIN SODIUM 30 MG/0.3ML IJ SOSY
30.0000 mg | PREFILLED_SYRINGE | Freq: Every day | INTRAMUSCULAR | Status: DC
  Administered 2022-05-29 – 2022-05-30 (×2): 30 mg via SUBCUTANEOUS
  Filled 2022-05-29 (×2): qty 0.3

## 2022-05-29 MED ORDER — SODIUM CHLORIDE (PF) 0.9 % IJ SOLN
INTRAMUSCULAR | Status: AC
  Filled 2022-05-29: qty 50

## 2022-05-29 MED ORDER — NICOTINE 21 MG/24HR TD PT24
21.0000 mg | MEDICATED_PATCH | Freq: Every day | TRANSDERMAL | Status: DC
  Administered 2022-05-29 – 2022-05-30 (×2): 21 mg via TRANSDERMAL
  Filled 2022-05-29 (×2): qty 1

## 2022-05-29 MED ORDER — IOHEXOL 300 MG/ML  SOLN
100.0000 mL | Freq: Once | INTRAMUSCULAR | Status: AC | PRN
  Administered 2022-05-29: 75 mL via INTRAVENOUS

## 2022-05-29 MED ORDER — METOPROLOL TARTRATE 5 MG/5ML IV SOLN: 5.0000 mg | Freq: Four times a day (QID) | INTRAVENOUS | Status: DC | PRN

## 2022-05-29 MED ORDER — ACETAMINOPHEN 325 MG PO TABS: 650.0000 mg | ORAL_TABLET | Freq: Four times a day (QID) | ORAL | Status: DC | PRN

## 2022-05-29 MED ORDER — NICOTINE 7 MG/24HR TD PT24
7.0000 mg | MEDICATED_PATCH | Freq: Every day | TRANSDERMAL | Status: DC
  Administered 2022-05-29: 7 mg via TRANSDERMAL
  Filled 2022-05-29: qty 1

## 2022-05-29 NOTE — Progress Notes (Signed)
Request placed to schedule robotic navigational bronchoscopy and endobronchial ultrasound by Dr. Thora Lance, goal 06/12/2022

## 2022-05-29 NOTE — ED Provider Notes (Signed)
[place] EMERGENCY DEPARTMENT AT [facility] Provider Note   CSN: [id number] Arrival date & time: 05/29/22  4098     History  Chief Complaint  Patient presents with   Dizziness    Barbara Green is a 82 y.o. female presented to ED with complaint of intermittent lightheadedness and hyponatremia.  Patient reports that she has noted for several months that she has episodes of "dizziness and staggering" specifically when she stands up.  She went to urgent care yesterday and was referred to the ED for further workup, there was concern that the patient had been increasingly hyponatremic, sodium 121 in the ED yesterday.  She was advised to be admitted to the hospital with the patient left AMA.  She returns this morning and is now willing to be admitted to the hospital.  She says she feels "completely normal and fine" when she was sitting on the bed, but when she stands up she starts to feel "lightheaded".  She says this is been ongoing for several months.  She denies any prior problems with hyponatremia in the past.  She is not on any diuretic medicines.  She is on lisinopril for blood pressure.  She reports she does drink copious amounts of water daily because her doctor told her to stay hydrated.  She does not eat a no salt diet, she does put some salt in her food.  She does also smoke.  She says she smokes "a few cigarettes a day".  HPI     Home Medications Prior to Admission medications   Medication Sig Start Date End Date Taking? Authorizing Provider  acetaminophen (TYLENOL) 325 MG tablet Take 650 mg by mouth as needed.    [provider]  lisinopril (ZESTRIL) 5 MG tablet Take 1 tablet (5 mg total) by mouth daily. 02/18/22   [name], MD      Allergies    Patient has no known allergies.    Review of Systems   Review of Systems  Physical Exam Updated Vital Signs BP 136/86 (BP Location: Right Arm)   Pulse 95   Temp 97.6 F (36.4 C) (Oral)   Resp 18    Ht 4\' 11"  (1.499 m)   Wt 45 kg   SpO2 98%   BMI 20.04 kg/m  Physical Exam Constitutional:      General: She is not in acute distress. HENT:     Head: Normocephalic and atraumatic.  Eyes:     Conjunctiva/sclera: Conjunctivae normal.     Pupils: Pupils are equal, round, and reactive to light.  Cardiovascular:     Rate and Rhythm: Normal rate and regular rhythm.  Pulmonary:     Effort: Pulmonary effort is normal. No respiratory distress.  Abdominal:     General: There is no distension.     Tenderness: There is no abdominal tenderness.  Skin:    General: Skin is warm and dry.  Neurological:     General: No focal deficit present.     Mental Status: She is alert and oriented to person, place, and time. Mental status is at baseline.  Psychiatric:        Mood and Affect: Mood normal.        Behavior: Behavior normal.     ED Results / Procedures / Treatments   Labs (all labs ordered are listed, but only abnormal results are displayed) Labs Reviewed  BASIC METABOLIC PANEL - Abnormal; Notable for the following components:      Result  Value   Sodium 121 (*)    Chloride 85 (*)    Glucose, Bld 123 (*)    BUN 6 (*)    Creatinine, Ser 0.39 (*)    All other components within normal limits  CBC - Abnormal; Notable for the following components:   RBC 5.22 (*)    Hemoglobin 15.2 (*)    Platelets 463 (*)    All other components within normal limits  URINALYSIS, ROUTINE W REFLEX MICROSCOPIC - Abnormal; Notable for the following components:   APPearance HAZY (*)    Hgb urine dipstick SMALL (*)    Ketones, ur 20 (*)    Protein, ur 30 (*)    Leukocytes,Ua LARGE (*)    Bacteria, UA RARE (*)    All other components within normal limits  OSMOLALITY, URINE - Abnormal; Notable for the following components:   Osmolality, Ur 257 (*)    All other components within normal limits  OSMOLALITY - Abnormal; Notable for the following components:   Osmolality 254 (*)    All other components  within normal limits  MAGNESIUM  NA AND K (SODIUM & POTASSIUM), RAND UR  SODIUM  SODIUM    EKG None  Radiology CT CHEST ABDOMEN PELVIS W CONTRAST  Result Date: 05/29/2022 CLINICAL DATA:  New breast mass. Staging malignancy. On chemotherapy and radiation. * Tracking Code: BO * EXAM: CT CHEST, ABDOMEN, AND PELVIS WITH CONTRAST TECHNIQUE: Multidetector CT imaging of the chest, abdomen and pelvis was performed following the standard protocol during bolus administration of intravenous contrast. RADIATION DOSE REDUCTION: This exam was performed according to the departmental dose-optimization program which includes automated exposure control, adjustment of the mA and/or kV according to patient size and/or use of iterative reconstruction technique. CONTRAST:  75mL OMNIPAQUE IOHEXOL 300 MG/ML  SOLN COMPARISON:  Chest x-ray earlier 05/29/2022 FINDINGS: CT CHEST FINDINGS Cardiovascular: The thoracic aorta has a normal course and caliber with scattered partially calcified atherosclerotic plaque. There is a bovine type aortic arch, a normal variant. Coronary artery calcifications are seen. No pericardial effusion. The heart is nonenlarged. Mediastinum/Nodes: Small thyroid gland. Small hiatal hernia. Mild soft tissue thickening at the level of the GE junction as seen on axial image 50 of series 2 and coronal image 75 of series 8. A subtle lesion is not excluded. No specific abnormal lymph node enlargement identified in the axillary region, right hilum. There is a left hilar lymph node on series 2, image 30 measuring 18 by 18 mm. There are some prominent mediastinal nodes identified. Example precarinal measuring 10 by 12 mm on series 2, image 25. Subcarinal node in short axis has a diameter of 9 mm on series 2, image 28. These other nodes are not clearly abnormally enlarged or borderline. Attention on follow-up. No abnormal nodes in the thoracic inlet, chest wall or internal mammary chain regions. Lungs/Pleura: There  is a large complex left lower lobe lung nodule as seen on x-ray. On CT image 89 of series 6 this lesion that extends out to the pleura measures 4.8 x 4.1 cm. Central low-density or potential necrosis. Adjacent mild opacity. Nonaggressive lung lesion is possible. Elsewhere there are areas of pleural thickening along the right anterior hemithorax. Apical pleural thickening bilaterally. Peripheral areas of scarring and fibrotic changes are seen. No pneumothorax or effusion. Emphysematous lung changes. 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 Musculoskeletal: Scattered degenerative changes along the spine. Multiple healing right lateral rib fractures. These are nondisplaced. Fracture lines are still present but  there is marginal sclerosis and some callus. Please correlate with the time course. Ribs 5, 6 and 7. Soft tissue stranding and skin thickening of the right breast at the edge of the imaging field. Please correlate for known history. CT ABDOMEN PELVIS FINDINGS Hepatobiliary: Focal fat deposition seen in the liver adjacent to the falciform ligament. No space-occupying liver lesion otherwise. Patent portal vein. Gallbladder is nondilated Pancreas: Unremarkable. No pancreatic ductal dilatation or surrounding inflammatory changes. Spleen: Normal in size without focal abnormality. Adrenals/Urinary Tract: Adrenal glands are preserved. Bilateral renal cysts. There is a dominant focus on the left side measuring 3.7 cm in diameter with Hounsfield unit of 6. Bosniak 1 lesion. The other lesions are similar. No specific imaging follow-up. No collecting system dilatation. Preserved contours of the urinary bladder. Stomach/Bowel: On this non oral contrast exam large bowel has a normal course and caliber. Sigmoid colon diverticulosis. The stomach and small bowel are nondilated. The appendix is not well seen in the right lower quadrant but no pericecal stranding or fluid. Some breathing motion.  Vascular/Lymphatic: Aortic atherosclerosis. No enlarged abdominal or pelvic lymph nodes. Reproductive: Uterus and bilateral adnexa are unremarkable. Other: Anasarca.  No bowel obstruction, free air or free fluid. Musculoskeletal: Curvature of the spine with advanced degenerative changes along the lumbar spine. Global osteopenia. IMPRESSION: Large potentially centrally necrotic left lower lobe lung mass worrisome for neoplasm. There is a differential. Patient has a history of breast cancer this could be a metastatic lesion but based on appearance this also could be a primary lung lesion. Metastatic left hilar lymph node. Few prominent borderline mediastinal nodes. Small hiatal hernia. Area of focal nodularity at the GE junction of uncertain etiology and significance. Further workup when appropriate to exclude secondary lesion. No bowel obstruction, free air or free fluid. Colonic diverticulosis. Extensive chronic lung changes. Healing right-sided rib fractures. Electronically Signed   By: Karen Kays M.D.   On: 05/29/2022 13:21   DG Chest Portable 1 View  Result Date: 05/29/2022 CLINICAL DATA:  Chest mass/malignancy screening, lightheadedness, staggering, hyponatremia EXAM: PORTABLE CHEST 1 VIEW COMPARISON:  Portable exam 1022 hours without priors for comparison FINDINGS: Normal heart size, mediastinal contours, and pulmonary vascularity. Atherosclerotic calcification aorta. LEFT lower lung mass 6.3 x 4.4 cm highly suspicious for neoplasm. Underlying bronchitic and emphysematous changes with biapical scarring greater on RIGHT. No acute infiltrate, pleural effusion, or pneumothorax. Bones demineralized with large calcified loose body RIGHT subcoracoid. IMPRESSION: 6.3 x 4.4 cm diameter lower LEFT lung mass highly suspicious for neoplasm; CT chest with contrast recommended for further evaluation. Underlying COPD changes and biapical scarring greater on RIGHT. Aortic Atherosclerosis (ICD10-I70.0) and Emphysema  (ICD10-J43.9). Findings called to Dr. Renaye Rakers on 05/29/2022 at 1046 hours. Electronically Signed   By: Ulyses Southward M.D.   On: 05/29/2022 10:47    Procedures Procedures    Medications Ordered in ED Medications  nicotine (NICODERM CQ - dosed in mg/24 hr) patch 7 mg (7 mg Transdermal Patch Applied 05/29/22 1037)  acetaminophen (TYLENOL) tablet 650 mg (has no administration in time range)  sodium chloride flush (NS) 0.9 % injection 3 mL (has no administration in time range)  metoprolol tartrate (LOPRESSOR) injection 5 mg (has no administration in time range)  enoxaparin (LOVENOX) injection 30 mg (has no administration in time range)  sodium chloride 0.9 % bolus 1,000 mL (0 mLs Intravenous Stopped 05/29/22 1130)  iohexol (OMNIPAQUE) 300 MG/ML solution 100 mL (75 mLs Intravenous Contrast Given 05/29/22 1240)    ED Course/ Medical  Decision Making/ A&P Clinical Course as of 05/29/22 1644  Thu May 29, 2022  1044 Na stable from yesterday [MT]  1104 Patient updated regarding concern for pulmonary mass seen on chest x-ray.  Will plan for additional CT testing for cancer staging and screening.  Will still plan for admission. [MT]  1133 Admitted to hospitalist [MT]    Clinical Course User Index [MT] Kuulei Kleier, Kermit Balo, MD                             Medical Decision Making Amount and/or Complexity of Data Reviewed Labs: ordered. Radiology: ordered.  Risk OTC drugs. Prescription drug management. Decision regarding hospitalization.   This patient presents to the ED with concern for lightheadedness. This involves an extensive number of treatment options, and is a complaint that carries with it a high risk of complications and morbidity.  The differential diagnosis includes symptomatic hyponatremia versus metabolic derangement versus anemia versus blood glucose derangement versus other.  Per review of the patient's prior record she appears to have been gradually increasing her hyponatremia over the  past 12 months.  It is not clear what the underlying cause of this.  Reports she drinks copious amounts of water and it is possible she is over diluted.  She is also a smoker, which may raise concern for potential pulmonary malignancy.  Co-morbidities that complicate the patient evaluation: Smoking history and high risk malignancy.  External records from outside source obtained and reviewed including  NA levels over past year  I ordered and personally interpreted labs.  The pertinent results include:  Na levels over past 8 months: 129 -> 129 -> 124 (3 days ago) -> 121 (1 day ago)  I ordered imaging studies including dg chest I independently visualized and interpreted imaging which showed left sided lung mass I agree with the radiologist interpretation  The patient was maintained on a cardiac monitor.  I personally viewed and interpreted the cardiac monitored which showed an underlying rhythm of: sinus tachycardia and sinus rhythm  I ordered medication including IV saline infusion for hyponatremia. Nicotine patch per patient request  I have reviewed the patients home medicines and have made adjustments as needed   After the interventions noted above, I reevaluated the patient and found that they have: stayed the same   Dispostion:  After consideration of the diagnostic results and the patients response to treatment, I feel that the patent would benefit from medical admission.  At this time the patient does not demonstrate any evidence of encephalopathy, confusion, or any recent history of seizure, to warrant hypertonic saline or intensive care monitoring.          Final Clinical Impression(s) / ED Diagnoses Final diagnoses:  Dizziness  Hyponatremia  Lung mass    Rx / DC Orders ED Discharge Orders     None         Terald Sleeper, MD 05/29/22 1644

## 2022-05-29 NOTE — Progress Notes (Signed)
Patient is refusing MRI. States she is claustrophobic. I told her we can get her something for anxiety and she states, "I don't want to do it. It is a waste of time." Dr Delton Coombes notified.

## 2022-05-29 NOTE — H&P (View-Only) (Signed)
 NAME:  Barbara Green, MRN:  [id number], DOB:  03/30/1940, LOS: 0 ADMISSION DATE:  05/29/2022, CONSULTATION DATE:  05/29/22 REFERRING MD:  Grunz CHIEF COMPLAINT:  Lung Mass   History of Present Illness:  Barbara Green is a 81 y.o. female who has a PMH as below including but not limited to right breast CA (invasive ductal carcinoma) s/p lumpectomy and XRT 2021. She presented to [facility] ED 5/2 with dizziness. Apparently has felt as if "something is wrong" for about a year with balance and "stumbling".  She was found to have hyponatremia with Na of 121 and was incidentally found to have LLL mass on CXR which prompted CT scan. This demonstrated large LLL lung mass with probable central necrosis along with left hilar LAD and a few mediastinal nodes.  PCCM was consulted for further evaluation of lung mass. She is a long time smoker, roughly 1/2ppd x decades now (unclear exactly how long). Denies any headaches, seizures, vision changes, fevers/chills/sweats, hemoptysis.  Pertinent  Medical History:  has Tobacco dependence; Unspecified lump in the right breast, upper inner quadrant ; Essential hypertension; Tachycardia; Malignant neoplasm of upper-inner quadrant of right breast in female, estrogen receptor positive (HCC); and Hyponatremia on their problem list.   Significant Hospital Events: Including procedures, antibiotic start and stop dates in addition to other pertinent events   5/2 admit.  Interim History / Subjective:  Comfortable, walking around the room. Currently no dizziness or balance issues. "Feel fine".  Understandably anxious about lung mass. She has requested we not discuss with her family and she will do so on her own.  Objective:  Blood pressure (!) 142/98, pulse (!) 120, temperature 97.9 F (36.6 C), temperature source Oral, resp. rate 19, weight 45 kg, SpO2 97 %.       No intake or output data in the 24 hours ending 05/29/22 1438 Filed Weights   05/29/22 0936  Weight: 45 kg     Examination: General: Adult female, walking around room, in NAD. Neuro: A&O x 3, no deficits. HEENT: [place]/AT. Sclerae anicteric. EOMI. Cardiovascular: RRR, no M/R/G.  Lungs: Respirations even and unlabored.  CTA bilaterally, No W/R/R. Abdomen: BS x 4, soft, NT/ND.  Musculoskeletal: No gross deformities, no edema.  Skin: Intact, warm, no rashes.  Labs/imaging personally reviewed:  CT chest/abd/pelv 5/2 > large LLL lung mass with probable central necrosis along with left hilar LAD and a few mediastinal nodes.  Assessment & Plan:   LLL lung mass - presumably SCC or SCLC given her hx along with hyponatremia (likely SIADH). - IR feels that LLL mass would not be amenable to or high yield with transthoracic biopsy; therefore, will discuss with our team regarding navigational bronchoscopy with EBUS along with the timing of this (likely May 16 as of now). - She will need outpatient PET thereafter. - She will need referral to oncology.  Tobacco dependence. - Nicotine patch. - Cessation counseling.   Rest per primary team.    Best practice (evaluated daily):  Per primary team.  Labs   CBC: Recent Labs  Lab 05/26/22 1323 05/28/22 1110 05/29/22 1004  WBC 10.0 9.9 9.9  HGB 14.4 15.0 15.2*  HCT 43.0 42.6 45.2  MCV 88 85.4 86.6  PLT 418 433* 463*    Basic Metabolic Panel: Recent Labs  Lab 05/26/22 1323 05/28/22 1110 05/29/22 1004  NA 124* 121* 121*  K 5.3* 3.9 3.9  CL 86* 85* 85*  CO2 18* 26 24  GLUCOSE 95 104* 123*    BUN 7* 5* 6*  CREATININE 0.37* 0.30* 0.39*  CALCIUM 10.0 9.5 9.6  MG  --  1.7 1.9  PHOS  --  3.4  --    GFR: Estimated Creatinine Clearance: 39.8 mL/min (A) (by C-G formula based on SCr of 0.39 mg/dL (L)). Recent Labs  Lab 05/26/22 1323 05/28/22 1110 05/29/22 1004  WBC 10.0 9.9 9.9    Liver Function Tests: Recent Labs  Lab 05/26/22 1323 05/28/22 1110  AST 20 15  ALT 12 8  ALKPHOS 141* 115  BILITOT 0.6 0.7  PROT 6.9 7.3  ALBUMIN 4.4  4.4   No results for input(s): "LIPASE", "AMYLASE" in the last 168 hours. No results for input(s): "AMMONIA" in the last 168 hours.  ABG No results found for: "PHART", "PCO2ART", "PO2ART", "HCO3", "TCO2", "ACIDBASEDEF", "O2SAT"   Coagulation Profile: No results for input(s): "INR", "PROTIME" in the last 168 hours.  Cardiac Enzymes: No results for input(s): "CKTOTAL", "CKMB", "CKMBINDEX", "TROPONINI" in the last 168 hours.  HbA1C: Hgb A1c MFr Bld  Date/Time Value Ref Range Status  10/19/2019 02:51 PM 5.4 4.8 - 5.6 % Final    Comment:             Prediabetes: 5.7 - 6.4          Diabetes: >6.4          Glycemic control for adults with diabetes: <7.0     CBG: No results for input(s): "GLUCAP" in the last 168 hours.  Review of Systems:   All negative; except for those that are bolded, which indicate positives.  Constitutional: weight loss, weight gain, night sweats, fevers, chills, fatigue, weakness.  HEENT: headaches, sore throat, sneezing, nasal congestion, post nasal drip, difficulty swallowing, tooth/dental problems, visual complaints, visual changes, ear aches. Neuro: difficulty with speech, weakness, numbness, ataxia, dizziness, imbalance. CV:  chest pain, orthopnea, PND, swelling in lower extremities, dizziness, palpitations, syncope.  Resp: cough, hemoptysis, dyspnea, wheezing. GI: heartburn, indigestion, abdominal pain, nausea, vomiting, diarrhea, constipation, change in bowel habits, loss of appetite, hematemesis, melena, hematochezia.  GU: dysuria, change in color of urine, urgency or frequency, flank pain, hematuria. MSK: joint pain or swelling, decreased range of motion. Psych: change in mood or affect, depression, anxiety, suicidal ideations, homicidal ideations. Skin: rash, itching, bruising.   Past Medical History:  She,  has a past medical history of History of radiation therapy (06/29/19-07/29/19) and Hypertension.   Surgical History:   Past Surgical History:   Procedure Laterality Date   BREAST LUMPECTOMY WITH SENTINEL LYMPH NODE BIOPSY Right 05/17/2019   Procedure: RIGHT BREAST LUMPECTOMY WITH SENTINEL LYMPH NODE BIOPSY;  Surgeon: Blackman, Douglas, MD;  Location: [facility];  Service: General;  Laterality: Right;   NO PAST SURGERIES       Social History:   reports that she has been smoking cigarettes. She has a 9.90 pack-year smoking history. She has been exposed to tobacco smoke. She has never used smokeless tobacco. She reports current alcohol use. She reports that she does not use drugs.   Family History:  Her family history includes Alcohol abuse in her father; Alzheimer's disease in her mother; Heart disease in her father.   Allergies No Known Allergies   Home Medications  Prior to Admission medications   Medication Sig Start Date End Date Taking? Authorizing Provider  acetaminophen (TYLENOL) 325 MG tablet Take 650 mg by mouth as needed.    [provider]  lisinopril (ZESTRIL) 5 MG tablet Take 1 tablet (5 mg total)   by mouth daily. 02/18/22   Wilson, Amelia, MD     Franchon Ketterman, PA - [facility] For pager details, please see AMION or use Epic chat  After 1900, please call [facility] for cross coverage needs 05/29/2022, 2:38 PM    

## 2022-05-29 NOTE — H&P (Addendum)
History and Physical    Patient: Barbara Green ZOX:[id number] DOB: 1940/03/28 DOA: 05/29/2022 DOS: the patient was seen and examined on 05/29/2022 PCP: [name], MD  Patient coming from: Home  Chief Complaint:  Chief Complaint  Patient presents with   Dizziness   HPI: Barbara Green is an 82 y.o. female with a medical history of tobacco use, right breast CA s/p lumpectomy and radiation in 2023 who presented to the ED again on 05/29/2022 for dizziness. She reports about 1 year of feeling like "something's wrong." She had been feeling lightheaded like she'd lose her balance when moving around, worse with bending over intermittently. No rotational vertigo or symptoms at rest. No passing out or falling. Did not change when she began drinking more water. She went to her PCP and was told to drink more water and "just increased lisinopril," per patient. She presented to urgent care for these symptoms and when sodium level returned low, was referred to the ED where she presented (to [facility]) 5/1. Admission was recommended though the patient opted to go home, returning today to [facility] for admission. She has no current complaints.  She does smoke less than a pack per day, has done so for many decades. No headache, vision changes, seizure activity or history. No fever, chills, night sweats, weight loss, poor appetite, nausea, vomiting, cough, hemoptysis, shortness of breath, dysuria, pain anywhere, leg swelling or any other symptom on a pretty extensive ROS.   Work up in ED shows lung mass for which CT is ordered. Na stable at 121 despite getting 1L NS in ED the previous day. Additional IVF ordered and admission requested.   Review of Systems: As mentioned in the history of present illness. All other systems reviewed and are negative. Past Medical History:  Diagnosis Date   History of radiation therapy 06/29/19-07/29/19   Right breast IMRT- Dr. Roselind Messier   Hypertension    Past Surgical History:   Procedure Laterality Date   BREAST LUMPECTOMY WITH SENTINEL LYMPH NODE BIOPSY Right 05/17/2019   Procedure: RIGHT BREAST LUMPECTOMY WITH SENTINEL LYMPH NODE BIOPSY;  Surgeon: Abigail Miyamoto, MD;  Location: [facility];  Service: General;  Laterality: Right;   NO PAST SURGERIES     Social History:  reports that she has been smoking cigarettes. She has a 9.90 pack-year smoking history. She has been exposed to tobacco smoke. She has never used smokeless tobacco. She reports current alcohol use. She reports that she does not use drugs.  No Known Allergies  Family History  Problem Relation Age of Onset   Alzheimer's disease Mother    Heart disease Father    Alcohol abuse Father     Prior to Admission medications   Medication Sig Start Date End Date Taking? Authorizing Provider  acetaminophen (TYLENOL) 325 MG tablet Take 650 mg by mouth as needed.    [provider]  lisinopril (ZESTRIL) 2.5 MG tablet TAKE 1 TABLET BY MOUTH DAILY. 01/21/22   Rema Fendt, NP  lisinopril (ZESTRIL) 5 MG tablet Take 1 tablet (5 mg total) by mouth daily. 02/18/22   [name], MD    Physical Exam: Vitals:   05/29/22 0929 05/29/22 0936  BP: (!) 142/98   Pulse: (!) 120   Resp: 19   Temp: 97.6 F (36.4 C)   TempSrc: Oral   SpO2: 97%   Weight:  45 kg  Gen: Pleasant elderly female in no distress Pulm: Nonlabored with crackles at left mid-lower zones.  CV:  RRR, rate in 90's on my exam. No JVD or pitting edema GI: Soft, NT, ND, +BS. Neuro: Alert and oriented. No new focal deficits. Ext: Warm, no deformities. Skin: No rashes, lesions or ulcers on visualized skin   Data Reviewed: CXR: Rounded oblong mass in LLL. Elsewhere clear on background of emphysema.  CT C/A/P pending  Na 121, SCr 0.39. Glucose 123. Hgb 15.2, WBC 9.9k, plt 463k.  Serum osm, urine studies pending  Assessment and Plan: Hyponatremia: Chronic (Na < 130 at least x8 months). No neurological symptoms.  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 Does push free water at home. - Stop IVF - Fluid restriction.  - Trend Na, serum Osm. Urine studies pending. - TSH (previously wnl at 1.3)  Lung mass: Highly suspicious for malignancy.  - CT C/A/P w/contrast ordered and pending. Will involve appropriate service for tissue biopsy based on results.  - ADDENDUM: Discussed with IR, Dr. Grace Isaac, who sees no extrapulmonary target for biopsy. Pulmonary, Dr. Delton Coombes consulted to opine on feasibility of bronch. Given central necrotic findings on CT, will hold pharmacologic VTE ppx. Will need outpatient PET which may identify an alternative target.   Tobacco use:  - Nicotine patch on - Cessation counseling provided. Pt plans to stop entirely.  - Will add albuterol prn given extent of emphysema, though pt without symptoms.   HTN:  - Took lisinopril, monitor BP  History of right breast CA: s/p lumpectomy (invasive ductal carcinoma) 05/17/2019 with positive deep margin but low risk oncotype. Received radiation per Dr. Roselind Messier, no chemotherapy per Dr. Mosetta Putt.   Advance Care Planning: DNR, discussed at admission  Consults: None  Family Communication: None  Severity of Illness: The appropriate patient status for this patient is INPATIENT. Inpatient status is judged to be reasonable and necessary in order to provide the required intensity of service to ensure the patient's safety. The patient's presenting symptoms, physical exam findings, and initial radiographic and laboratory data in the context of their chronic comorbidities is felt to place them at high risk for further clinical deterioration. Furthermore, it is not anticipated that the patient will be medically stable for discharge from the hospital within 2 midnights of admission.   *I certify that at the point of admission it is my clinical judgment that the patient will require inpatient hospital care spanning beyond 2 midnights from  the point of admission due to high intensity of service, high risk for further deterioration and high frequency of surveillance required.*  Author: Tyrone Nine, MD 05/29/2022 12:53 PM  For on call review [email or web address].

## 2022-05-29 NOTE — ED Triage Notes (Signed)
C/o lightheaded when standing x 1 year and feeling like she is staggering from it.  Pt reports worse when bending over.   Pt reports let AMA yesterday for low sodium level.  NA level yesterday 121

## 2022-05-29 NOTE — Consult Note (Signed)
NAME:  Barbara Green, MRN:  [id number], DOB:  [birth year], LOS: 0 ADMISSION DATE:  05/29/2022, CONSULTATION DATE:  05/29/22 REFERRING MD:  Jarvis Newcomer CHIEF COMPLAINT:  Lung Mass   History of Present Illness:  Barbara Green is a 82 y.o. female who has a PMH as below including but not limited to right breast CA (invasive ductal carcinoma) s/p lumpectomy and XRT 2021. She presented to [facility] ED 5/2 with dizziness. Apparently has felt as if "something is wrong" for about a year with balance and "stumbling".  She was found to have hyponatremia with Na of 121 and was incidentally found to have LLL mass on CXR which prompted CT scan. This demonstrated large LLL lung mass with probable central necrosis along with left hilar LAD and a few mediastinal nodes.  PCCM was consulted for further evaluation of lung mass. She is a long time smoker, roughly 1/2ppd x decades now (unclear exactly how long). Denies any headaches, seizures, vision changes, fevers/chills/sweats, hemoptysis.  Pertinent  Medical History:  has Tobacco dependence; Unspecified lump in the right breast, upper inner quadrant ; Essential hypertension; Tachycardia; Malignant neoplasm of upper-inner quadrant of right breast in female, estrogen receptor positive (HCC); and Hyponatremia on their problem list.   Significant Hospital Events: Including procedures, antibiotic start and stop dates in addition to other pertinent events   5/2 admit.  Interim History / Subjective:  Comfortable, walking around the room. Currently no dizziness or balance issues. "Feel fine".  Understandably anxious about lung mass. She has requested we not discuss with her family and she will do so on her own.  Objective:  Blood pressure (!) 142/98, pulse (!) 120, temperature 97.9 F (36.6 C), temperature source Oral, resp. rate 19, weight 45 kg, SpO2 97 %.       No intake or output data in the 24 hours ending 05/29/22 1438 Filed Weights   05/29/22 0936  Weight: 45 kg     Examination: General: Adult female, walking around room, in NAD. Neuro: A&O x 3, no deficits. HEENT: [place]/AT. Sclerae anicteric. EOMI. Cardiovascular: RRR, no M/R/G.  Lungs: Respirations even and unlabored.  CTA bilaterally, No W/R/R. Abdomen: BS x 4, soft, NT/ND.  Musculoskeletal: No gross deformities, no edema.  Skin: Intact, warm, no rashes.  Labs/imaging personally reviewed:  CT chest/abd/pelv 5/2 > large LLL lung mass with probable central necrosis along with left hilar LAD and a few mediastinal nodes.  Assessment & Plan:   LLL lung mass - presumably SCC or SCLC given her hx along with hyponatremia (likely SIADH). - IR feels that LLL mass would not be amenable to or high yield with transthoracic biopsy; therefore, will discuss with our team regarding navigational bronchoscopy with EBUS along with the timing of this (likely May 16 as of now). - She will need outpatient PET thereafter. - She will need referral to oncology.  Tobacco dependence. - Nicotine patch. - Cessation counseling.   Rest per primary team.    Best practice (evaluated daily):  Per primary team.  Labs   CBC: Recent Labs  Lab 05/26/22 1323 05/28/22 1110 05/29/22 1004  WBC 10.0 9.9 9.9  HGB 14.4 15.0 15.2*  HCT 43.0 42.6 45.2  MCV 88 85.4 86.6  PLT 418 433* 463*    Basic Metabolic Panel: Recent Labs  Lab 05/26/22 1323 05/28/22 1110 05/29/22 1004  NA 124* 121* 121*  K 5.3* 3.9 3.9  CL 86* 85* 85*  CO2 18* 26 24  GLUCOSE 95 104* 123*  BUN 7* 5* 6*  CREATININE 0.37* 0.30* 0.39*  CALCIUM 10.0 9.5 9.6  MG  --  1.7 1.9  PHOS  --  3.4  --    GFR: Estimated Creatinine Clearance: 39.8 mL/min (A) (by C-G formula based on SCr of 0.39 mg/dL (L)). Recent Labs  Lab 05/26/22 1323 05/28/22 1110 05/29/22 1004  WBC 10.0 9.9 9.9    Liver Function Tests: Recent Labs  Lab 05/26/22 1323 05/28/22 1110  AST 20 15  ALT 12 8  ALKPHOS 141* 115  BILITOT 0.6 0.7  PROT 6.9 7.3  ALBUMIN 4.4  4.4   No results for input(s): "LIPASE", "AMYLASE" in the last 168 hours. No results for input(s): "AMMONIA" in the last 168 hours.  ABG No results found for: "PHART", "PCO2ART", "PO2ART", "HCO3", "TCO2", "ACIDBASEDEF", "O2SAT"   Coagulation Profile: No results for input(s): "INR", "PROTIME" in the last 168 hours.  Cardiac Enzymes: No results for input(s): "CKTOTAL", "CKMB", "CKMBINDEX", "TROPONINI" in the last 168 hours.  HbA1C: Hgb A1c MFr Bld  Date/Time Value Ref Range Status  10/19/2019 02:51 PM 5.4 4.8 - 5.6 % Final    Comment:             Prediabetes: 5.7 - 6.4          Diabetes: >6.4          Glycemic control for adults with diabetes: <7.0     CBG: No results for input(s): "GLUCAP" in the last 168 hours.  Review of Systems:   All negative; except for those that are bolded, which indicate positives.  Constitutional: weight loss, weight gain, night sweats, fevers, chills, fatigue, weakness.  HEENT: headaches, sore throat, sneezing, nasal congestion, post nasal drip, difficulty swallowing, tooth/dental problems, visual complaints, visual changes, ear aches. Neuro: difficulty with speech, weakness, numbness, ataxia, dizziness, imbalance. CV:  chest pain, orthopnea, PND, swelling in lower extremities, dizziness, palpitations, syncope.  Resp: cough, hemoptysis, dyspnea, wheezing. GI: heartburn, indigestion, abdominal pain, nausea, vomiting, diarrhea, constipation, change in bowel habits, loss of appetite, hematemesis, melena, hematochezia.  GU: dysuria, change in color of urine, urgency or frequency, flank pain, hematuria. MSK: joint pain or swelling, decreased range of motion. Psych: change in mood or affect, depression, anxiety, suicidal ideations, homicidal ideations. Skin: rash, itching, bruising.   Past Medical History:  She,  has a past medical history of History of radiation therapy (06/29/19-07/29/19) and Hypertension.   Surgical History:   Past Surgical History:   Procedure Laterality Date   BREAST LUMPECTOMY WITH SENTINEL LYMPH NODE BIOPSY Right 05/17/2019   Procedure: RIGHT BREAST LUMPECTOMY WITH SENTINEL LYMPH NODE BIOPSY;  Surgeon: Abigail Miyamoto, MD;  Location: [facility];  Service: General;  Laterality: Right;   NO PAST SURGERIES       Social History:   reports that she has been smoking cigarettes. She has a 9.90 pack-year smoking history. She has been exposed to tobacco smoke. She has never used smokeless tobacco. She reports current alcohol use. She reports that she does not use drugs.   Family History:  Her family history includes Alcohol abuse in her father; Alzheimer's disease in her mother; Heart disease in her father.   Allergies No Known Allergies   Home Medications  Prior to Admission medications   Medication Sig Start Date End Date Taking? Authorizing Provider  acetaminophen (TYLENOL) 325 MG tablet Take 650 mg by mouth as needed.    [provider]  lisinopril (ZESTRIL) 5 MG tablet Take 1 tablet (5 mg total)  by mouth daily. 02/18/22   [name], MD     Rutherford Guys, PA - [facility] For pager details, please see AMION or use Epic chat  After 1900, please call [facility] for cross coverage needs 05/29/2022, 2:38 PM

## 2022-05-29 NOTE — Progress Notes (Addendum)
Brief Interventional Radiology Note:  Request received for lung mass biopsy. After review of imaging Dr. Grace Isaac and Dr. Elby Showers determined biopsy would be high risk for bleeding and low yield d/t no target. Care team made aware of findings.     Alex Gardener, AGNP-BC 05/29/2022, 2:29 PM

## 2022-05-30 DIAGNOSIS — E871 Hypo-osmolality and hyponatremia: Secondary | ICD-10-CM | POA: Diagnosis not present

## 2022-05-30 LAB — BASIC METABOLIC PANEL
Anion gap: 10 (ref 5–15)
BUN: 7 mg/dL — ABNORMAL LOW (ref 8–23)
CO2: 23 mmol/L (ref 22–32)
Calcium: 9.3 mg/dL (ref 8.9–10.3)
Chloride: 90 mmol/L — ABNORMAL LOW (ref 98–111)
Creatinine, Ser: 0.45 mg/dL (ref 0.44–1.00)
GFR, Estimated: >60 mL/min (ref >60)
Glucose, Bld: 102 mg/dL — ABNORMAL HIGH (ref 70–99)
Potassium: 3.6 mmol/L (ref 3.5–5.1)
Sodium: 123 mmol/L — ABNORMAL LOW (ref 135–145)

## 2022-05-30 LAB — TSH: TSH: 3.383 u[IU]/mL (ref 0.350–4.500)

## 2022-05-30 LAB — SODIUM: Sodium: 135 mmol/L (ref 135–145)

## 2022-05-30 MED ORDER — NICOTINE 21 MG/24HR TD PT24: 21.0000 mg | MEDICATED_PATCH | Freq: Every day | TRANSDERMAL | 0 refills | Status: DC

## 2022-05-30 NOTE — Plan of Care (Signed)
  Problem: Health Behavior/Discharge Planning: Goal: Ability to manage health-related needs will improve Outcome: Progressing  Pt states wanting to go home with nicotine patch to assist with smoking cessation

## 2022-05-30 NOTE — Discharge Summary (Signed)
Physician Discharge Summary  Barbara Green UJW:[id number] DOB: 07-09-1940 DOA: 05/29/2022  PCP: [name], MD  Admit date: 05/29/2022 Discharge date: 05/30/2022  Admitted From: Home Disposition: Home  Recommendations for Outpatient Follow-up:  Follow up with PCP in 1-2 weeks Repeat BMP with PCP next week  Home Health: None Equipment/Devices: None  Discharge Condition: Stable CODE STATUS: DNR Diet recommendation: As tolerated  Brief/Interim Summary: Barbara Green is an 82 y.o. female with a medical history of tobacco use, right breast CA s/p lumpectomy and radiation in 2023 who presented to the ED again on 05/29/2022 for dizziness.   Patient admitted with presumed acute on chronic symptomatic hyponatremia (patient has not had a normal sodium labs in 2 years, typically less than 130), likely hypervolemic given recent recommendations to increase free water intake in the outpatient setting. This improved with diminished PO intake.  During her hospitalization notable lung mass found on CT chest -plan for outpatient follow-up with Dr. Delton Coombes pulmonology for COVID swab on the 13th and planned EBUS on the 16th for definitive biopsy of this mass.  Patient otherwise feels quite well denies nausea vomiting diarrhea constipation headache fevers chills chest pain lightheadedness dizziness presyncope or syncope.  She is otherwise stable and agreeable for discharge home, close outpatient follow-up with PCP for repeat labs next week as well as close follow-up with pulmonology in the interim.  Will likely need follow-up with oncology once biopsy has resulted, she does have history of breast cancer and had previously followed with Dr. Mosetta Putt.  Discharge Diagnoses:  Principal Problem:   Hyponatremia Active Problems:   Tobacco dependence   Essential hypertension   Mass of lower lobe of left lung   Lung mass    Discharge Instructions  Discharge Instructions     Discharge patient   Complete by: As  directed    Discharge disposition: 01-Home or Self Care   Discharge patient date: 05/30/2022      Allergies as of 05/30/2022   No Known Allergies      Medication List     STOP taking these medications    TYLENOL 500 MG tablet Generic drug: acetaminophen       TAKE these medications    Bonine 25 MG Chew Generic drug: Meclizine HCl Chew 25 mg by mouth daily as needed (for vertigo).   lisinopril 5 MG tablet Commonly known as: ZESTRIL Take 1 tablet (5 mg total) by mouth daily.   nicotine 21 mg/24hr patch Commonly known as: NICODERM CQ - dosed in mg/24 hours Place 1 patch (21 mg total) onto the skin daily. Start taking on: May 31, 2022   UNKNOWN TO PATIENT Take 1 tablet by mouth See admin instructions. Unnamed allergy tablet- Take 1 tablet by mouth once a day as needed for seasonal allergies        No Known Allergies  Consultations: Pulmonology, IR  Procedures/Studies: CT Super D Chest Wo Contrast  Result Date: 05/29/2022 CLINICAL DATA:  Left lower lobe mass and left hilar adenopathy. * Tracking Code: BO * EXAM: CT CHEST WITHOUT CONTRAST TECHNIQUE: Multidetector CT imaging of the chest was performed using thin slice collimation for electromagnetic bronchoscopy planning purposes, without intravenous contrast. RADIATION DOSE REDUCTION: This exam was performed according to the departmental dose-optimization program which includes automated exposure control, adjustment of the mA and/or kV according to patient size and/or use of iterative reconstruction technique. COMPARISON:  05/29/2022 FINDINGS: Cardiovascular: Coronary, aortic arch, and branch vessel atherosclerotic vascular disease. Mediastinum/Nodes: Small type 1 hiatal  hernia. Left infrahilar lymph node 1.4 cm in short axis on image 28 series 2. Lungs/Pleura: 4.3 by 4.5 cm left lower lobe mass, image 89 series 7, abutting the pleural margin but without definite chest wall invasion. Biapical pleuroparenchymal scarring.  Centrilobular emphysema. Subpleural scarring and nodularity in the right chest anteriorly, stable. Upper Abdomen: Abdominal aortic atherosclerosis. Partially imaged fluid density 3 cm cyst of the left mid kidney. No further imaging workup of this lesion is indicated. Musculoskeletal: Free osteochondral fragments in the subscapular recess of both glenohumeral joints. Old right rib fractures.  Thoracic spondylosis. IMPRESSION: 1. 4.3 by 4.5 cm left lower lobe mass, abutting the pleural margin but without definite chest wall invasion. Mildly enlarged left infrahilar lymph node. 2. Small type 1 hiatal hernia. 3. Free osteochondral fragments in the subscapular recess of both glenohumeral joints. 4. Aortic atherosclerosis. 5. Emphysema. Aortic Atherosclerosis (ICD10-I70.0) and Emphysema (ICD10-J43.9). Electronically Signed   By: Gaylyn Rong M.D.   On: 05/29/2022 17:55   CT CHEST ABDOMEN PELVIS W CONTRAST  Result Date: 05/29/2022 CLINICAL DATA:  New breast mass. Staging malignancy. On chemotherapy and radiation. * Tracking Code: BO * EXAM: CT CHEST, ABDOMEN, AND PELVIS WITH CONTRAST TECHNIQUE: Multidetector CT imaging of the chest, abdomen and pelvis was performed following the standard protocol during bolus administration of intravenous contrast. RADIATION DOSE REDUCTION: This exam was performed according to the departmental dose-optimization program which includes automated exposure control, adjustment of the mA and/or kV according to patient size and/or use of iterative reconstruction technique. CONTRAST:  75mL OMNIPAQUE IOHEXOL 300 MG/ML  SOLN COMPARISON:  Chest x-ray earlier 05/29/2022 FINDINGS: CT CHEST FINDINGS Cardiovascular: The thoracic aorta has a normal course and caliber with scattered partially calcified atherosclerotic plaque. There is a bovine type aortic arch, a normal variant. Coronary artery calcifications are seen. No pericardial effusion. The heart is nonenlarged. Mediastinum/Nodes: Small  thyroid gland. Small hiatal hernia. Mild soft tissue thickening at the level of the GE junction as seen on axial image 50 of series 2 and coronal image 75 of series 8. A subtle lesion is not excluded. No specific abnormal lymph node enlargement identified in the axillary region, right hilum. There is a left hilar lymph node on series 2, image 30 measuring 18 by 18 mm. There are some prominent mediastinal nodes identified. Example precarinal measuring 10 by 12 mm on series 2, image 25. Subcarinal node in short axis has a diameter of 9 mm on series 2, image 28. These other nodes are not clearly abnormally enlarged or borderline. Attention on follow-up. No abnormal nodes in the thoracic inlet, chest wall or internal mammary chain regions. Lungs/Pleura: There is a large complex left lower lobe lung nodule as seen on x-ray. On CT image 89 of series 6 this lesion that extends out to the pleura measures 4.8 x 4.1 cm. Central low-density or potential necrosis. Adjacent mild opacity. Nonaggressive lung lesion is possible. Elsewhere there are areas of pleural thickening along the right anterior hemithorax. Apical pleural thickening bilaterally. Peripheral areas of scarring and fibrotic changes are seen. No pneumothorax or effusion. Emphysematous lung changes. 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 Musculoskeletal: Scattered degenerative changes along the spine. Multiple healing right lateral rib fractures. These are nondisplaced. Fracture lines are still present but there is marginal sclerosis and some callus. Please correlate with the time course. Ribs 5, 6 and 7. Soft tissue stranding and skin thickening of the right breast at the edge of the imaging field. Please correlate for known  history. CT ABDOMEN PELVIS FINDINGS Hepatobiliary: Focal fat deposition seen in the liver adjacent to the falciform ligament. No space-occupying liver lesion otherwise. Patent portal vein. Gallbladder is  nondilated Pancreas: Unremarkable. No pancreatic ductal dilatation or surrounding inflammatory changes. Spleen: Normal in size without focal abnormality. Adrenals/Urinary Tract: Adrenal glands are preserved. Bilateral renal cysts. There is a dominant focus on the left side measuring 3.7 cm in diameter with Hounsfield unit of 6. Bosniak 1 lesion. The other lesions are similar. No specific imaging follow-up. No collecting system dilatation. Preserved contours of the urinary bladder. Stomach/Bowel: On this non oral contrast exam large bowel has a normal course and caliber. Sigmoid colon diverticulosis. The stomach and small bowel are nondilated. The appendix is not well seen in the right lower quadrant but no pericecal stranding or fluid. Some breathing motion. Vascular/Lymphatic: Aortic atherosclerosis. No enlarged abdominal or pelvic lymph nodes. Reproductive: Uterus and bilateral adnexa are unremarkable. Other: Anasarca.  No bowel obstruction, free air or free fluid. Musculoskeletal: Curvature of the spine with advanced degenerative changes along the lumbar spine. Global osteopenia. IMPRESSION: Large potentially centrally necrotic left lower lobe lung mass worrisome for neoplasm. There is a differential. Patient has a history of breast cancer this could be a metastatic lesion but based on appearance this also could be a primary lung lesion. Metastatic left hilar lymph node. Few prominent borderline mediastinal nodes. Small hiatal hernia. Area of focal nodularity at the GE junction of uncertain etiology and significance. Further workup when appropriate to exclude secondary lesion. No bowel obstruction, free air or free fluid. Colonic diverticulosis. Extensive chronic lung changes. Healing right-sided rib fractures. Electronically Signed   By: Karen Kays M.D.   On: 05/29/2022 13:21   DG Chest Portable 1 View  Result Date: 05/29/2022 CLINICAL DATA:  Chest mass/malignancy screening, lightheadedness, staggering,  hyponatremia EXAM: PORTABLE CHEST 1 VIEW COMPARISON:  Portable exam 1022 hours without priors for comparison FINDINGS: Normal heart size, mediastinal contours, and pulmonary vascularity. Atherosclerotic calcification aorta. LEFT lower lung mass 6.3 x 4.4 cm highly suspicious for neoplasm. Underlying bronchitic and emphysematous changes with biapical scarring greater on RIGHT. No acute infiltrate, pleural effusion, or pneumothorax. Bones demineralized with large calcified loose body RIGHT subcoracoid. IMPRESSION: 6.3 x 4.4 cm diameter lower LEFT lung mass highly suspicious for neoplasm; CT chest with contrast recommended for further evaluation. Underlying COPD changes and biapical scarring greater on RIGHT. Aortic Atherosclerosis (ICD10-I70.0) and Emphysema (ICD10-J43.9). Findings called to Dr. Renaye Rakers on 05/29/2022 at 1046 hours. Electronically Signed   By: Ulyses Southward M.D.   On: 05/29/2022 10:47     Subjective: No acute issues or events overnight denies nausea vomiting diarrhea constipation headache fevers chills chest pain shortness of breath dizziness lightheadedness fatigue or weakness   Discharge Exam: Vitals:   05/29/22 2355 05/30/22 0409  BP: 139/84 (!) 143/83  Pulse: 96 96  Resp: 15 17  Temp: 98.8 F (37.1 C) 97.7 F (36.5 C)  SpO2: 99% 97%   Vitals:   05/29/22 1601 05/29/22 2005 05/29/22 2355 05/30/22 0409  BP: 136/86 (!) 141/80 139/84 (!) 143/83  Pulse: 95 (!) 107 96 96  Resp: 18 18 15 17   Temp: 97.6 F (36.4 C) 97.6 F (36.4 C) 98.8 F (37.1 C) 97.7 F (36.5 C)  TempSrc: Oral Oral Oral Oral  SpO2: 98% 97% 99% 97%  Weight:      Height: 4\' 11"  (1.499 m)       General: Pt is alert, awake, not in acute distress Cardiovascular:  RRR, S1/S2 +, no rubs, no gallops Respiratory: CTA bilaterally, no wheezing, no rhonchi Abdominal: Soft, NT, ND, bowel sounds + Extremities: no edema, no cyanosis    The results of significant diagnostics from this hospitalization (including  imaging, microbiology, ancillary and laboratory) are listed below for reference.     Microbiology: No results found for this or any previous visit (from the past 240 hour(s)).   Labs: BNP (last 3 results) No results for input(s): "BNP" in the last 8760 hours. Basic Metabolic Panel: Recent Labs  Lab 05/26/22 1323 05/28/22 1110 05/29/22 1004 05/29/22 1658 05/30/22 0014 05/30/22 0553  NA 124* 121* 121* 126* 135 123*  K 5.3* 3.9 3.9  --   --  3.6  CL 86* 85* 85*  --   --  90*  CO2 18* 26 24  --   --  23  GLUCOSE 95 104* 123*  --   --  102*  BUN 7* 5* 6*  --   --  7*  CREATININE 0.37* 0.30* 0.39*  --   --  0.45  CALCIUM 10.0 9.5 9.6  --   --  9.3  MG  --  1.7 1.9  --   --   --   PHOS  --  3.4  --   --   --   --     Liver Function Tests: Recent Labs  Lab 05/26/22 1323 05/28/22 1110  AST 20 15  ALT 12 8  ALKPHOS 141* 115  BILITOT 0.6 0.7  PROT 6.9 7.3  ALBUMIN 4.4 4.4    No results for input(s): "LIPASE", "AMYLASE" in the last 168 hours. No results for input(s): "AMMONIA" in the last 168 hours. CBC: Recent Labs  Lab 05/26/22 1323 05/28/22 1110 05/29/22 1004  WBC 10.0 9.9 9.9  HGB 14.4 15.0 15.2*  HCT 43.0 42.6 45.2  MCV 88 85.4 86.6  PLT 418 433* 463*    Cardiac Enzymes: No results for input(s): "CKTOTAL", "CKMB", "CKMBINDEX", "TROPONINI" in the last 168 hours. BNP: Invalid input(s): "POCBNP" CBG: No results for input(s): "GLUCAP" in the last 168 hours. D-Dimer No results for input(s): "DDIMER" in the last 72 hours. Hgb A1c No results for input(s): "HGBA1C" in the last 72 hours. Lipid Profile No results for input(s): "CHOL", "HDL", "LDLCALC", "TRIG", "CHOLHDL", "LDLDIRECT" in the last 72 hours. Thyroid function studies Recent Labs    05/30/22 0553  TSH 3.383    Anemia work up No results for input(s): "VITAMINB12", "FOLATE", "FERRITIN", "TIBC", "IRON", "RETICCTPCT" in the last 72 hours. Urinalysis    Component Value Date/Time   COLORURINE  YELLOW 05/29/2022 1043   APPEARANCEUR HAZY (A) 05/29/2022 1043   LABSPEC 1.014 05/29/2022 1043   PHURINE 6.0 05/29/2022 1043   GLUCOSEU NEGATIVE 05/29/2022 1043   HGBUR SMALL (A) 05/29/2022 1043   BILIRUBINUR NEGATIVE 05/29/2022 1043   BILIRUBINUR negative 05/26/2022 1305   KETONESUR 20 (A) 05/29/2022 1043   PROTEINUR 30 (A) 05/29/2022 1043   UROBILINOGEN 0.2 05/26/2022 1305   NITRITE NEGATIVE 05/29/2022 1043   LEUKOCYTESUR LARGE (A) 05/29/2022 1043   Sepsis Labs Recent Labs  Lab 05/26/22 1323 05/28/22 1110 05/29/22 1004  WBC 10.0 9.9 9.9    Microbiology No results found for this or any previous visit (from the past 240 hour(s)).   Time coordinating discharge: Over 30 minutes  SIGNED:   Azucena Fallen, DO [facility] 05/30/2022, 3:20 PM Pager   If 7PM-7AM, please contact night-coverage [email or web address]

## 2022-05-30 NOTE — Progress Notes (Signed)
Discharge information provided to pt. Discussed need to switch arms with nicotine patch & change every 24 hours. Also reviewed need to avoid cigarettes while wearing patch. Pt verbalized understanding.  Drove herself here. Pt dressed & ambulated with another nurse to have car brought from valet services.

## 2022-05-30 NOTE — Progress Notes (Signed)
  Transition of Care [facility]) Screening Note   Patient Details  Name: STANNA AMRHEIN Date of Birth: 1940/06/29   Transition of Care [facility]) CM/SW Contact:    Lanier Clam, RN Phone Number: 05/30/2022, 10:20 AM    Transition of Care Department [facility]) has reviewed patient and no TOC needs have been identified at this time. We will continue to monitor patient advancement through interdisciplinary progression rounds. If new patient transition needs arise, please place a TOC consult.

## 2022-05-30 NOTE — TOC Transition Note (Signed)
Transition of Care [facility]) - CM/SW Discharge Note   Patient Details  Name: Barbara Green MRN: [id number] Date of Birth: 1940/10/04  Transition of Care [facility]) CM/SW Contact:  Lanier Clam, RN Phone Number: 05/30/2022, 12:44 PM   Clinical Narrative: Per nursing patient left prior Potential code 44 to be addressed by [facility] rep. No CM needs.     Final next level of care: Home/Self Care Barriers to Discharge: No Barriers Identified   Patient Goals and CMS Choice      Discharge Placement                         Discharge Plan and Services Additional resources added to the After Visit Summary for                                       Social Determinants of Health (SDOH) Interventions SDOH Screenings   Food Insecurity: No Food Insecurity (05/29/2022)  Housing: Low Risk  (05/29/2022)  Transportation Needs: No Transportation Needs (05/29/2022)  Utilities: Not At Risk (05/29/2022)  Depression (PHQ2-9): Low Risk  (02/18/2022)  Financial Resource Strain: Low Risk  (02/13/2022)  Physical Activity: Inactive (02/13/2022)  Stress: No Stress Concern Present (02/13/2022)  Tobacco Use: High Risk (05/29/2022)     Readmission Risk Interventions     No data to display

## 2022-05-30 NOTE — Progress Notes (Deleted)
Physician Discharge Summary  Barbara Green UJW:[id number] DOB: 07-09-1940 DOA: 05/29/2022  PCP: [name], MD  Admit date: 05/29/2022 Discharge date: 05/30/2022  Admitted From: Home Disposition: Home  Recommendations for Outpatient Follow-up:  Follow up with PCP in 1-2 weeks Repeat BMP with PCP next week  Home Health: None Equipment/Devices: None  Discharge Condition: Stable CODE STATUS: DNR Diet recommendation: As tolerated  Brief/Interim Summary: Barbara Green is an 82 y.o. female with a medical history of tobacco use, right breast CA s/p lumpectomy and radiation in 2023 who presented to the ED again on 05/29/2022 for dizziness.   Patient admitted with presumed acute on chronic symptomatic hyponatremia (patient has not had a normal sodium labs in 2 years, typically less than 130), likely hypervolemic given recent recommendations to increase free water intake in the outpatient setting. This improved with diminished PO intake.  During her hospitalization notable lung mass found on CT chest -plan for outpatient follow-up with Dr. Delton Coombes pulmonology for COVID swab on the 13th and planned EBUS on the 16th for definitive biopsy of this mass.  Patient otherwise feels quite well denies nausea vomiting diarrhea constipation headache fevers chills chest pain lightheadedness dizziness presyncope or syncope.  She is otherwise stable and agreeable for discharge home, close outpatient follow-up with PCP for repeat labs next week as well as close follow-up with pulmonology in the interim.  Will likely need follow-up with oncology once biopsy has resulted, she does have history of breast cancer and had previously followed with Dr. Mosetta Putt.  Discharge Diagnoses:  Principal Problem:   Hyponatremia Active Problems:   Tobacco dependence   Essential hypertension   Mass of lower lobe of left lung   Lung mass    Discharge Instructions  Discharge Instructions     Discharge patient   Complete by: As  directed    Discharge disposition: 01-Home or Self Care   Discharge patient date: 05/30/2022      Allergies as of 05/30/2022   No Known Allergies      Medication List     STOP taking these medications    TYLENOL 500 MG tablet Generic drug: acetaminophen       TAKE these medications    Bonine 25 MG Chew Generic drug: Meclizine HCl Chew 25 mg by mouth daily as needed (for vertigo).   lisinopril 5 MG tablet Commonly known as: ZESTRIL Take 1 tablet (5 mg total) by mouth daily.   nicotine 21 mg/24hr patch Commonly known as: NICODERM CQ - dosed in mg/24 hours Place 1 patch (21 mg total) onto the skin daily. Start taking on: May 31, 2022   UNKNOWN TO PATIENT Take 1 tablet by mouth See admin instructions. Unnamed allergy tablet- Take 1 tablet by mouth once a day as needed for seasonal allergies        No Known Allergies  Consultations: Pulmonology, IR  Procedures/Studies: CT Super D Chest Wo Contrast  Result Date: 05/29/2022 CLINICAL DATA:  Left lower lobe mass and left hilar adenopathy. * Tracking Code: BO * EXAM: CT CHEST WITHOUT CONTRAST TECHNIQUE: Multidetector CT imaging of the chest was performed using thin slice collimation for electromagnetic bronchoscopy planning purposes, without intravenous contrast. RADIATION DOSE REDUCTION: This exam was performed according to the departmental dose-optimization program which includes automated exposure control, adjustment of the mA and/or kV according to patient size and/or use of iterative reconstruction technique. COMPARISON:  05/29/2022 FINDINGS: Cardiovascular: Coronary, aortic arch, and branch vessel atherosclerotic vascular disease. Mediastinum/Nodes: Small type 1 hiatal  hernia. Left infrahilar lymph node 1.4 cm in short axis on image 28 series 2. Lungs/Pleura: 4.3 by 4.5 cm left lower lobe mass, image 89 series 7, abutting the pleural margin but without definite chest wall invasion. Biapical pleuroparenchymal scarring.  Centrilobular emphysema. Subpleural scarring and nodularity in the right chest anteriorly, stable. Upper Abdomen: Abdominal aortic atherosclerosis. Partially imaged fluid density 3 cm cyst of the left mid kidney. No further imaging workup of this lesion is indicated. Musculoskeletal: Free osteochondral fragments in the subscapular recess of both glenohumeral joints. Old right rib fractures.  Thoracic spondylosis. IMPRESSION: 1. 4.3 by 4.5 cm left lower lobe mass, abutting the pleural margin but without definite chest wall invasion. Mildly enlarged left infrahilar lymph node. 2. Small type 1 hiatal hernia. 3. Free osteochondral fragments in the subscapular recess of both glenohumeral joints. 4. Aortic atherosclerosis. 5. Emphysema. Aortic Atherosclerosis (ICD10-I70.0) and Emphysema (ICD10-J43.9). Electronically Signed   By: Gaylyn Rong M.D.   On: 05/29/2022 17:55   CT CHEST ABDOMEN PELVIS W CONTRAST  Result Date: 05/29/2022 CLINICAL DATA:  New breast mass. Staging malignancy. On chemotherapy and radiation. * Tracking Code: BO * EXAM: CT CHEST, ABDOMEN, AND PELVIS WITH CONTRAST TECHNIQUE: Multidetector CT imaging of the chest, abdomen and pelvis was performed following the standard protocol during bolus administration of intravenous contrast. RADIATION DOSE REDUCTION: This exam was performed according to the departmental dose-optimization program which includes automated exposure control, adjustment of the mA and/or kV according to patient size and/or use of iterative reconstruction technique. CONTRAST:  75mL OMNIPAQUE IOHEXOL 300 MG/ML  SOLN COMPARISON:  Chest x-ray earlier 05/29/2022 FINDINGS: CT CHEST FINDINGS Cardiovascular: The thoracic aorta has a normal course and caliber with scattered partially calcified atherosclerotic plaque. There is a bovine type aortic arch, a normal variant. Coronary artery calcifications are seen. No pericardial effusion. The heart is nonenlarged. Mediastinum/Nodes: Small  thyroid gland. Small hiatal hernia. Mild soft tissue thickening at the level of the GE junction as seen on axial image 50 of series 2 and coronal image 75 of series 8. A subtle lesion is not excluded. No specific abnormal lymph node enlargement identified in the axillary region, right hilum. There is a left hilar lymph node on series 2, image 30 measuring 18 by 18 mm. There are some prominent mediastinal nodes identified. Example precarinal measuring 10 by 12 mm on series 2, image 25. Subcarinal node in short axis has a diameter of 9 mm on series 2, image 28. These other nodes are not clearly abnormally enlarged or borderline. Attention on follow-up. No abnormal nodes in the thoracic inlet, chest wall or internal mammary chain regions. Lungs/Pleura: There is a large complex left lower lobe lung nodule as seen on x-ray. On CT image 89 of series 6 this lesion that extends out to the pleura measures 4.8 x 4.1 cm. Central low-density or potential necrosis. Adjacent mild opacity. Nonaggressive lung lesion is possible. Elsewhere there are areas of pleural thickening along the right anterior hemithorax. Apical pleural thickening bilaterally. Peripheral areas of scarring and fibrotic changes are seen. No pneumothorax or effusion. Emphysematous lung changes. 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 Musculoskeletal: Scattered degenerative changes along the spine. Multiple healing right lateral rib fractures. These are nondisplaced. Fracture lines are still present but there is marginal sclerosis and some callus. Please correlate with the time course. Ribs 5, 6 and 7. Soft tissue stranding and skin thickening of the right breast at the edge of the imaging field. Please correlate for known  history. CT ABDOMEN PELVIS FINDINGS Hepatobiliary: Focal fat deposition seen in the liver adjacent to the falciform ligament. No space-occupying liver lesion otherwise. Patent portal vein. Gallbladder is  nondilated Pancreas: Unremarkable. No pancreatic ductal dilatation or surrounding inflammatory changes. Spleen: Normal in size without focal abnormality. Adrenals/Urinary Tract: Adrenal glands are preserved. Bilateral renal cysts. There is a dominant focus on the left side measuring 3.7 cm in diameter with Hounsfield unit of 6. Bosniak 1 lesion. The other lesions are similar. No specific imaging follow-up. No collecting system dilatation. Preserved contours of the urinary bladder. Stomach/Bowel: On this non oral contrast exam large bowel has a normal course and caliber. Sigmoid colon diverticulosis. The stomach and small bowel are nondilated. The appendix is not well seen in the right lower quadrant but no pericecal stranding or fluid. Some breathing motion. Vascular/Lymphatic: Aortic atherosclerosis. No enlarged abdominal or pelvic lymph nodes. Reproductive: Uterus and bilateral adnexa are unremarkable. Other: Anasarca.  No bowel obstruction, free air or free fluid. Musculoskeletal: Curvature of the spine with advanced degenerative changes along the lumbar spine. Global osteopenia. IMPRESSION: Large potentially centrally necrotic left lower lobe lung mass worrisome for neoplasm. There is a differential. Patient has a history of breast cancer this could be a metastatic lesion but based on appearance this also could be a primary lung lesion. Metastatic left hilar lymph node. Few prominent borderline mediastinal nodes. Small hiatal hernia. Area of focal nodularity at the GE junction of uncertain etiology and significance. Further workup when appropriate to exclude secondary lesion. No bowel obstruction, free air or free fluid. Colonic diverticulosis. Extensive chronic lung changes. Healing right-sided rib fractures. Electronically Signed   By: Karen Kays M.D.   On: 05/29/2022 13:21   DG Chest Portable 1 View  Result Date: 05/29/2022 CLINICAL DATA:  Chest mass/malignancy screening, lightheadedness, staggering,  hyponatremia EXAM: PORTABLE CHEST 1 VIEW COMPARISON:  Portable exam 1022 hours without priors for comparison FINDINGS: Normal heart size, mediastinal contours, and pulmonary vascularity. Atherosclerotic calcification aorta. LEFT lower lung mass 6.3 x 4.4 cm highly suspicious for neoplasm. Underlying bronchitic and emphysematous changes with biapical scarring greater on RIGHT. No acute infiltrate, pleural effusion, or pneumothorax. Bones demineralized with large calcified loose body RIGHT subcoracoid. IMPRESSION: 6.3 x 4.4 cm diameter lower LEFT lung mass highly suspicious for neoplasm; CT chest with contrast recommended for further evaluation. Underlying COPD changes and biapical scarring greater on RIGHT. Aortic Atherosclerosis (ICD10-I70.0) and Emphysema (ICD10-J43.9). Findings called to Dr. Renaye Rakers on 05/29/2022 at 1046 hours. Electronically Signed   By: Ulyses Southward M.D.   On: 05/29/2022 10:47     Subjective: No acute issues or events overnight denies nausea vomiting diarrhea constipation headache fevers chills chest pain shortness of breath dizziness lightheadedness fatigue or weakness   Discharge Exam: Vitals:   05/29/22 2355 05/30/22 0409  BP: 139/84 (!) 143/83  Pulse: 96 96  Resp: 15 17  Temp: 98.8 F (37.1 C) 97.7 F (36.5 C)  SpO2: 99% 97%   Vitals:   05/29/22 1601 05/29/22 2005 05/29/22 2355 05/30/22 0409  BP: 136/86 (!) 141/80 139/84 (!) 143/83  Pulse: 95 (!) 107 96 96  Resp: 18 18 15 17   Temp: 97.6 F (36.4 C) 97.6 F (36.4 C) 98.8 F (37.1 C) 97.7 F (36.5 C)  TempSrc: Oral Oral Oral Oral  SpO2: 98% 97% 99% 97%  Weight:      Height: 4\' 11"  (1.499 m)       General: Pt is alert, awake, not in acute distress Cardiovascular:  RRR, S1/S2 +, no rubs, no gallops Respiratory: CTA bilaterally, no wheezing, no rhonchi Abdominal: Soft, NT, ND, bowel sounds + Extremities: no edema, no cyanosis    The results of significant diagnostics from this hospitalization (including  imaging, microbiology, ancillary and laboratory) are listed below for reference.     Microbiology: No results found for this or any previous visit (from the past 240 hour(s)).   Labs: BNP (last 3 results) No results for input(s): "BNP" in the last 8760 hours. Basic Metabolic Panel: Recent Labs  Lab 05/26/22 1323 05/28/22 1110 05/29/22 1004 05/29/22 1658 05/30/22 0014 05/30/22 0553  NA 124* 121* 121* 126* 135 123*  K 5.3* 3.9 3.9  --   --  3.6  CL 86* 85* 85*  --   --  90*  CO2 18* 26 24  --   --  23  GLUCOSE 95 104* 123*  --   --  102*  BUN 7* 5* 6*  --   --  7*  CREATININE 0.37* 0.30* 0.39*  --   --  0.45  CALCIUM 10.0 9.5 9.6  --   --  9.3  MG  --  1.7 1.9  --   --   --   PHOS  --  3.4  --   --   --   --    Liver Function Tests: Recent Labs  Lab 05/26/22 1323 05/28/22 1110  AST 20 15  ALT 12 8  ALKPHOS 141* 115  BILITOT 0.6 0.7  PROT 6.9 7.3  ALBUMIN 4.4 4.4   No results for input(s): "LIPASE", "AMYLASE" in the last 168 hours. No results for input(s): "AMMONIA" in the last 168 hours. CBC: Recent Labs  Lab 05/26/22 1323 05/28/22 1110 05/29/22 1004  WBC 10.0 9.9 9.9  HGB 14.4 15.0 15.2*  HCT 43.0 42.6 45.2  MCV 88 85.4 86.6  PLT 418 433* 463*   Cardiac Enzymes: No results for input(s): "CKTOTAL", "CKMB", "CKMBINDEX", "TROPONINI" in the last 168 hours. BNP: Invalid input(s): "POCBNP" CBG: No results for input(s): "GLUCAP" in the last 168 hours. D-Dimer No results for input(s): "DDIMER" in the last 72 hours. Hgb A1c No results for input(s): "HGBA1C" in the last 72 hours. Lipid Profile No results for input(s): "CHOL", "HDL", "LDLCALC", "TRIG", "CHOLHDL", "LDLDIRECT" in the last 72 hours. Thyroid function studies Recent Labs    05/30/22 0553  TSH 3.383   Anemia work up No results for input(s): "VITAMINB12", "FOLATE", "FERRITIN", "TIBC", "IRON", "RETICCTPCT" in the last 72 hours. Urinalysis    Component Value Date/Time   COLORURINE YELLOW  05/29/2022 1043   APPEARANCEUR HAZY (A) 05/29/2022 1043   LABSPEC 1.014 05/29/2022 1043   PHURINE 6.0 05/29/2022 1043   GLUCOSEU NEGATIVE 05/29/2022 1043   HGBUR SMALL (A) 05/29/2022 1043   BILIRUBINUR NEGATIVE 05/29/2022 1043   BILIRUBINUR negative 05/26/2022 1305   KETONESUR 20 (A) 05/29/2022 1043   PROTEINUR 30 (A) 05/29/2022 1043   UROBILINOGEN 0.2 05/26/2022 1305   NITRITE NEGATIVE 05/29/2022 1043   LEUKOCYTESUR LARGE (A) 05/29/2022 1043   Sepsis Labs Recent Labs  Lab 05/26/22 1323 05/28/22 1110 05/29/22 1004  WBC 10.0 9.9 9.9   Microbiology No results found for this or any previous visit (from the past 240 hour(s)).   Time coordinating discharge: Over 30 minutes  SIGNED:   Azucena Fallen, DO [facility] 05/30/2022, 3:13 PM Pager   If 7PM-7AM, please contact night-coverage [email or web address]

## 2022-05-30 NOTE — Plan of Care (Signed)
  Problem: Education: Goal: Knowledge of General Education information will improve Description: Including pain rating scale, medication(s)/side effects and non-pharmacologic comfort measures Outcome: Completed/Met   Problem: Health Behavior/Discharge Planning: Goal: Ability to manage health-related needs will improve 05/30/2022 1232 by Loma Messing, RN Outcome: Completed/Met 05/30/2022 1048 by Loma Messing, RN Outcome: Progressing   Problem: Clinical Measurements: Goal: Ability to maintain clinical measurements within normal limits will improve Outcome: Completed/Met Goal: Will remain free from infection Outcome: Completed/Met Goal: Diagnostic test results will improve Outcome: Completed/Met Goal: Respiratory complications will improve Outcome: Completed/Met Goal: Cardiovascular complication will be avoided Outcome: Completed/Met   Problem: Activity: Goal: Risk for activity intolerance will decrease Outcome: Completed/Met   Problem: Nutrition: Goal: Adequate nutrition will be maintained Outcome: Completed/Met   Problem: Coping: Goal: Level of anxiety will decrease Outcome: Completed/Met   Problem: Elimination: Goal: Will not experience complications related to bowel motility Outcome: Completed/Met Goal: Will not experience complications related to urinary retention Outcome: Completed/Met   Problem: Pain Managment: Goal: General experience of comfort will improve Outcome: Completed/Met   Problem: Safety: Goal: Ability to remain free from injury will improve Outcome: Completed/Met   Problem: Skin Integrity: Goal: Risk for impaired skin integrity will decrease Outcome: Completed/Met

## 2022-06-02 ENCOUNTER — Telehealth: Payer: Self-pay

## 2022-06-02 NOTE — Transitions of Care (Post Inpatient/ED Visit) (Signed)
   06/02/2022  Name: ADVITHA FINTON MRN: [id number] DOB: 26-Oct-1940  Today's TOC FU Call Status: Today's TOC FU Call Status:: Successful TOC FU Call Competed TOC FU Call Complete Date: 06/02/22  Transition Care Management Follow-up Telephone Call Date of Discharge: 05/30/22 Discharge Facility: [facility]) Type of Discharge: Inpatient Admission Primary Inpatient Discharge Diagnosis:: hyponatremia How have you been since you were released from the hospital?: Better Any questions or concerns?: Yes Patient Questions/Concerns:: She said she is stil stagering around but that is not new, she has been doing that for years.  She also said that she thinks her sodium may be dropping again  Items Reviewed: Did you receive and understand the discharge instructions provided?: Yes Medications obtained,verified, and reconciled?: Yes (Medications Reviewed) (She said she has all of her  medications.  Her only question, is why does she need to stop the tylenol.  She said she doesn't need it but no one told her why she can;t take it,) Any new allergies since your discharge?: No Dietary orders reviewed?: Yes Type of Diet Ordered:: heart healthy Do you have support at home?: Yes People in Home: alone Name of Support/Comfort Primary Source: her daughter is coming to see her next week.  Medications Reviewed Today: Medications Reviewed Today     Reviewed by Salvatore Marvel, CPhT (Pharmacy Technician) on 05/29/22 at 1858  Med List Status: Complete   Medication Order Taking? Sig Documenting Provider Last Dose Status Informant  BONINE 25 MG CHEW [id number] Yes Chew 25 mg by mouth daily as needed (for vertigo). [provider] Past Week Active Self  lisinopril (ZESTRIL) 5 MG tablet [id number] Yes Take 1 tablet (5 mg total) by mouth daily. [name], MD 05/28/2022 Active Self  TYLENOL 500 MG tablet [id number] Yes Take 500 mg by mouth every 6 (six) hours as needed for mild pain or headache. [provider] unk Active Self  UNKNOWN TO PATIENT [id number] Yes Take 1 tablet by mouth See admin instructions. Unnamed allergy tablet- Take 1 tablet by mouth once a day as needed for seasonal allergies [provider] unk Active Self            Home Care and Equipment/Supplies: Were Home Health Services Ordered?: No Any new equipment or medical supplies ordered?: No  Functional Questionnaire: Do you need assistance with bathing/showering or dressing?: No Do you need assistance with meal preparation?: No Do you need assistance with eating?: No Do you have difficulty maintaining continence: No Do you need assistance with getting out of bed/getting out of a chair/moving?: No Do you have difficulty managing or taking your medications?: No  Follow up appointments reviewed: PCP Follow-up appointment confirmed?: Yes Date of PCP follow-up appointment?: 06/17/22 Follow-up Provider: Dr Andrey Campanile [facility] Follow-up appointment confirmed?: Yes Date of Specialist follow-up appointment?: 06/12/22 Follow-Up Specialty Provider:: bronchoscopy Do you need transportation to your follow-up appointment?: No Do you understand care options if your condition(s) worsen?: Yes-patient verbalized understanding    SIGNATURE Robyne Peers, RN

## 2022-06-02 NOTE — Telephone Encounter (Signed)
From the discharge call:    She said she is stil staggering around but that is not new, she has been doing that for years.  She also said that she thinks her sodium may be dropping again   She said she has all of her  medications.  Her only question, is why does she need to stop the tylenol.  She said she doesn't need it but no one told her why she can;t take it,     Follow-up Provider: Dr Andrey Campanile - 06/17/2022.  :Specialist follow- up:  06/12/22 - bronchoscopy.

## 2022-06-05 NOTE — Telephone Encounter (Signed)
noted 

## 2022-06-09 ENCOUNTER — Other Ambulatory Visit: Payer: Self-pay

## 2022-06-09 ENCOUNTER — Other Ambulatory Visit: Payer: Medicare HMO

## 2022-06-09 DIAGNOSIS — Z01812 Encounter for preprocedural laboratory examination: Secondary | ICD-10-CM

## 2022-06-10 ENCOUNTER — Encounter (HOSPITAL_COMMUNITY): Payer: Self-pay | Admitting: Student

## 2022-06-10 ENCOUNTER — Other Ambulatory Visit: Payer: Self-pay

## 2022-06-10 NOTE — Progress Notes (Signed)
SDW call  Patient was given pre-op instructions over the phone. Patient verbalized understanding of instructions provided.    PCP - Dr. [name] Oncologist: Dr. Mosetta Putt Cardiologist - Denies Pulmonary:    PPM/ICD - Denies  Chest x-ray - 05/29/2022 EKG -  DOS 06/12/2022 Stress Test - ECHO -  Cardiac Cath -   Sleep Study/sleep apnea/CPAP: Denies  Non-diabetic   Blood Thinner Instructions: Denies Aspirin Instructions: Denies   ERAS Protcol - NO, NPO PRE-SURGERY Ensure or G2-    COVID TEST- [place] 06/09/2022, awaiting results    Anesthesia review: No   Patient denies shortness of breath, fever, cough and chest pain over the phone call  Your procedure is scheduled on Thursday Jun 12, 2022  Report to [facility] Main Entrance "A" at  0945  A.M., then check in with the Admitting office.  Call this number if you have problems the morning of surgery:  [phone]   If you have any questions prior to your surgery date call [phone]: Open Monday-Friday 8am-4pm If you experience any cold or flu symptoms such as cough, fever, chills, shortness of breath, etc. between now and your scheduled surgery, please notify us at the above number    Remember:  Do not eat or drink after midnight the night before your surgery  Take these medicines the morning of surgery with A SIP OF WATER: None  As of today, STOP taking any Aspirin (unless otherwise instructed by your surgeon) Aleve, Naproxen, Ibuprofen, Motrin, Advil, Goody's, BC's, all herbal medications, fish oil, and all vitamins.

## 2022-06-11 ENCOUNTER — Telehealth: Payer: Self-pay | Admitting: Student

## 2022-06-11 LAB — NOVEL CORONAVIRUS, NAA: SARS-CoV-2, NAA: NOT DETECTED

## 2022-06-11 NOTE — Telephone Encounter (Signed)
Called and spoke with patient's daughter Barbara Green. There is no DPR on file but Barbara Green is listed as an emergency contact. She wanted to know what procedure the patient would be having tomorrow. I provided her with the information as well as the arrival times from the letter. She verbalized understanding.   She also asked that if the patient is called with any testing results to call her first. She lives in [place] and wants to be able to get someone to be with the patient since she lives alone. Barbara Green can be contacted at [phone].   All questions were answered. Nothing further needed at time of call.

## 2022-06-11 NOTE — Telephone Encounter (Signed)
Pt daughter Darl Pikes calling in because she wants to know the reason of the endoscopy. Also she wants clarification as to where her mom will be going.

## 2022-06-12 ENCOUNTER — Encounter (HOSPITAL_COMMUNITY)
Admission: RE | Disposition: A | Discharge status: Home / Self Care | Payer: Self-pay | Source: Home / Self Care | Attending: Student

## 2022-06-12 ENCOUNTER — Ambulatory Visit (HOSPITAL_COMMUNITY)
Admission: RE | Admit: 2022-06-12 | Discharge: 2022-06-12 | Disposition: A | Discharge status: Home / Self Care | Payer: Medicare HMO | Attending: Student | Admitting: Student

## 2022-06-12 ENCOUNTER — Ambulatory Visit (HOSPITAL_COMMUNITY): Payer: Medicare HMO | Admitting: Anesthesiology

## 2022-06-12 ENCOUNTER — Encounter (HOSPITAL_COMMUNITY): Payer: Self-pay | Admitting: Student

## 2022-06-12 ENCOUNTER — Ambulatory Visit (HOSPITAL_COMMUNITY): Payer: Medicare HMO

## 2022-06-12 ENCOUNTER — Other Ambulatory Visit: Payer: Self-pay

## 2022-06-12 ENCOUNTER — Ambulatory Visit (HOSPITAL_BASED_OUTPATIENT_CLINIC_OR_DEPARTMENT_OTHER): Payer: Medicare HMO | Admitting: Anesthesiology

## 2022-06-12 DIAGNOSIS — E871 Hypo-osmolality and hyponatremia: Secondary | ICD-10-CM | POA: Insufficient documentation

## 2022-06-12 DIAGNOSIS — F17210 Nicotine dependence, cigarettes, uncomplicated: Secondary | ICD-10-CM | POA: Diagnosis not present

## 2022-06-12 DIAGNOSIS — Z87891 Personal history of nicotine dependence: Secondary | ICD-10-CM | POA: Diagnosis not present

## 2022-06-12 DIAGNOSIS — R918 Other nonspecific abnormal finding of lung field: Secondary | ICD-10-CM | POA: Diagnosis not present

## 2022-06-12 DIAGNOSIS — Z923 Personal history of irradiation: Secondary | ICD-10-CM | POA: Insufficient documentation

## 2022-06-12 DIAGNOSIS — C3432 Malignant neoplasm of lower lobe, left bronchus or lung: Secondary | ICD-10-CM | POA: Insufficient documentation

## 2022-06-12 DIAGNOSIS — I10 Essential (primary) hypertension: Secondary | ICD-10-CM | POA: Diagnosis not present

## 2022-06-12 DIAGNOSIS — C773 Secondary and unspecified malignant neoplasm of axilla and upper limb lymph nodes: Secondary | ICD-10-CM | POA: Diagnosis not present

## 2022-06-12 DIAGNOSIS — Z01818 Encounter for other preprocedural examination: Secondary | ICD-10-CM

## 2022-06-12 DIAGNOSIS — Z853 Personal history of malignant neoplasm of breast: Secondary | ICD-10-CM | POA: Diagnosis not present

## 2022-06-12 HISTORY — PX: BRONCHIAL BIOPSY: SHX5109

## 2022-06-12 HISTORY — PX: VIDEO BRONCHOSCOPY WITH ENDOBRONCHIAL ULTRASOUND: SHX6177

## 2022-06-12 HISTORY — PX: BRONCHIAL NEEDLE ASPIRATION BIOPSY: SHX5106

## 2022-06-12 HISTORY — PX: FIDUCIAL MARKER PLACEMENT: SHX6858

## 2022-06-12 LAB — POCT I-STAT, CHEM 8
BUN: 3 mg/dL — ABNORMAL LOW (ref 8–23)
Calcium, Ion: 1.16 mmol/L (ref 1.15–1.40)
Chloride: 87 mmol/L — ABNORMAL LOW (ref 98–111)
Creatinine, Ser: 0.3 mg/dL — ABNORMAL LOW (ref 0.44–1.00)
Glucose, Bld: 109 mg/dL — ABNORMAL HIGH (ref 70–99)
HCT: 47 % — ABNORMAL HIGH (ref 36.0–46.0)
Hemoglobin: 16 g/dL — ABNORMAL HIGH (ref 12.0–15.0)
Potassium: 3.8 mmol/L (ref 3.5–5.1)
Sodium: 124 mmol/L — ABNORMAL LOW (ref 135–145)
TCO2: 25 mmol/L (ref 22–32)

## 2022-06-12 SURGERY — BRONCHOSCOPY, WITH BIOPSY USING ELECTROMAGNETIC NAVIGATION
Anesthesia: General

## 2022-06-12 MED ORDER — ROCURONIUM BROMIDE 10 MG/ML (PF) SYRINGE
PREFILLED_SYRINGE | INTRAVENOUS | Status: DC | PRN
  Administered 2022-06-12: 40 mg via INTRAVENOUS

## 2022-06-12 MED ORDER — PHENYLEPHRINE 80 MCG/ML (10ML) SYRINGE FOR IV PUSH (FOR BLOOD PRESSURE SUPPORT)
PREFILLED_SYRINGE | INTRAVENOUS | Status: DC | PRN
  Administered 2022-06-12 (×2): 160 ug via INTRAVENOUS
  Administered 2022-06-12: 80 ug via INTRAVENOUS
  Administered 2022-06-12: 160 ug via INTRAVENOUS
  Administered 2022-06-12: 80 ug via INTRAVENOUS

## 2022-06-12 MED ORDER — ONDANSETRON HCL 4 MG/2ML IJ SOLN: 4.0000 mg | Freq: Once | INTRAMUSCULAR | Status: DC | PRN

## 2022-06-12 MED ORDER — CHLORHEXIDINE GLUCONATE 0.12 % MT SOLN: 15.0000 mL | Freq: Once | OROMUCOSAL | Status: AC

## 2022-06-12 MED ORDER — IPRATROPIUM-ALBUTEROL 0.5-2.5 (3) MG/3ML IN SOLN
3.0000 mL | Freq: Once | RESPIRATORY_TRACT | Status: AC
  Administered 2022-06-12: 3 mL via RESPIRATORY_TRACT

## 2022-06-12 MED ORDER — LIDOCAINE 2% (20 MG/ML) 5 ML SYRINGE
INTRAMUSCULAR | Status: DC | PRN
  Administered 2022-06-12: 40 mg via INTRAVENOUS

## 2022-06-12 MED ORDER — PROPOFOL 500 MG/50ML IV EMUL
INTRAVENOUS | Status: DC | PRN
  Administered 2022-06-12: 125 ug/kg/min via INTRAVENOUS

## 2022-06-12 MED ORDER — CHLORHEXIDINE GLUCONATE 0.12 % MT SOLN
OROMUCOSAL | Status: AC
  Administered 2022-06-12: 15 mL via OROMUCOSAL
  Filled 2022-06-12: qty 15

## 2022-06-12 MED ORDER — FENTANYL CITRATE (PF) 100 MCG/2ML IJ SOLN
INTRAMUSCULAR | Status: AC
  Filled 2022-06-12: qty 2

## 2022-06-12 MED ORDER — DEXAMETHASONE SODIUM PHOSPHATE 10 MG/ML IJ SOLN
INTRAMUSCULAR | Status: DC | PRN
  Administered 2022-06-12: 5 mg via INTRAVENOUS

## 2022-06-12 MED ORDER — ONDANSETRON HCL 4 MG/2ML IJ SOLN
INTRAMUSCULAR | Status: DC | PRN
  Administered 2022-06-12: 4 mg via INTRAVENOUS

## 2022-06-12 MED ORDER — FENTANYL CITRATE (PF) 100 MCG/2ML IJ SOLN: 25.0000 ug | INTRAMUSCULAR | Status: DC | PRN

## 2022-06-12 MED ORDER — PROPOFOL 10 MG/ML IV BOLUS
INTRAVENOUS | Status: DC | PRN
  Administered 2022-06-12: 70 mg via INTRAVENOUS
  Administered 2022-06-12: 40 mg via INTRAVENOUS

## 2022-06-12 MED ORDER — IPRATROPIUM-ALBUTEROL 0.5-2.5 (3) MG/3ML IN SOLN
RESPIRATORY_TRACT | Status: AC
  Filled 2022-06-12: qty 3

## 2022-06-12 MED ORDER — LACTATED RINGERS IV SOLN: INTRAVENOUS | Status: DC

## 2022-06-12 MED ORDER — FENTANYL CITRATE (PF) 250 MCG/5ML IJ SOLN
INTRAMUSCULAR | Status: DC | PRN
  Administered 2022-06-12 (×2): 25 ug via INTRAVENOUS

## 2022-06-12 MED ORDER — SUGAMMADEX SODIUM 200 MG/2ML IV SOLN
INTRAVENOUS | Status: DC | PRN
  Administered 2022-06-12: 100 mg via INTRAVENOUS

## 2022-06-12 SURGICAL SUPPLY — 1 items: superlock fiducial IMPLANT

## 2022-06-12 NOTE — Anesthesia Preprocedure Evaluation (Addendum)
Anesthesia Evaluation  Patient identified by MRN, date of birth, ID band Patient awake    Reviewed: Allergy & Precautions, NPO status , Patient's Chart, lab work & pertinent test results  Airway Mallampati: II  TM Distance: >3 FB Neck ROM: Full    Dental  (+) Edentulous Lower, Edentulous Upper   Pulmonary Current Smoker and Patient abstained from smoking. LLL mass, hx breast ca Quit smoking about a month ago Previously <1ppd x 50 years   Pulmonary exam normal breath sounds clear to auscultation       Cardiovascular hypertension (156/84, P 105 in preop. EKG NSR tachy at 113bpm), Pt. on medications Normal cardiovascular exam Rhythm:Regular Rate:Normal     Neuro/Psych negative neurological ROS  negative psych ROS   GI/Hepatic negative GI ROS, Neg liver ROS,,,  Endo/Other  Chronic hyponatremia thought to be a/w lung mass- 123 last admission, 124 today  Asymptomatic currently  Renal/GU negative Renal ROS  negative genitourinary   Musculoskeletal negative musculoskeletal ROS (+)    Abdominal   Peds  Hematology negative hematology ROS (+) Hb 15.2   Anesthesia Other Findings   Reproductive/Obstetrics negative OB ROS                             Anesthesia Physical Anesthesia Plan  ASA: 3  Anesthesia Plan: General   Post-op Pain Management: Tylenol PO (pre-op)*   Induction: Intravenous  PONV Risk Score and Plan: 3 and Ondansetron, Dexamethasone and Treatment may vary due to age or medical condition  Airway Management Planned: Oral ETT  Additional Equipment: None  Intra-op Plan:   Post-operative Plan: Extubation in OR  Informed Consent: I have reviewed the patients History and Physical, chart, labs and discussed the procedure including the risks, benefits and alternatives for the proposed anesthesia with the patient or authorized representative who has indicated his/her understanding  and acceptance.     Dental advisory given  Plan Discussed with: CRNA  Anesthesia Plan Comments:         Anesthesia Quick Evaluation

## 2022-06-12 NOTE — Op Note (Signed)
Video Bronchoscopy with Robotic Assisted Bronchoscopic Navigation   Date of Operation: 06/12/2022   Pre-op Diagnosis: Left lung mass  Post-op Diagnosis: same  Surgeon: Judithann Graves    Anesthesia: General endotracheal anesthesia  Operation: Flexible video fiberoptic bronchoscopy with robotic assistance and biopsies.  Estimated Blood Loss: Minimal  Complications: None  Indications and History: Barbara Green is a 82 y.o. female with history of right breast CA (invasive ductal carcinoma) s/p lumpectomy and XRT 2021, hyponatremia referred for left lung mass and hilar lymphadenopathy. The risks, benefits, complications, treatment options and expected outcomes were discussed with the patient.  The possibilities of pneumothorax, pneumonia, reaction to medication, pulmonary aspiration, perforation of a viscus, bleeding, failure to diagnose a condition and creating a complication requiring transfusion or operation were discussed with the patient who freely signed the consent.    Description of Procedure: The patient was seen in the Preoperative Area, was examined and was deemed appropriate to proceed.  The patient was taken to [facility] endoscopy room 3, identified as Barbara Green and the procedure verified as Flexible Video Fiberoptic Bronchoscopy.  A Time Out was held and the above information confirmed.   Prior to the date of the procedure a high-resolution CT scan of the chest was performed. Utilizing ION software program a virtual tracheobronchial tree was generated to allow the creation of distinct navigation pathways to the patient's parenchymal abnormalities. After being taken to the operating room general anesthesia was initiated and the patient  was orally intubated. The video fiberoptic bronchoscope was introduced via the endotracheal tube and a general inspection was performed which showed normal right and left lung anatomy, aspiration of the bilateral mainstems was completed to remove any  remaining secretions. Robotic catheter inserted into patient's endotracheal tube.   Target #1 Left lung mass: The distinct navigation pathways prepared prior to this procedure were then utilized to navigate to patient's lesion identified on CT scan. The robotic catheter was secured into place and the vision probe was withdrawn.  Lesion location was approximated using fluoroscopy. Under fluoroscopic guidance transbronchial needle biopsies, and transbronchial forceps biopsies were performed to be sent for cytology and pathology. A fiducial marker was left in place under fluoroscopic guidance.  EBUS: Hilar and mediastinal lymph nodes were surveyed under convex probe EBUS with sampling as documented below.  At the end of the procedure a general airway inspection was performed and there was minor bleeding at site of 11L biopsy controlled with iced saline. The bronchoscope was removed.  The patient tolerated the procedure well. There was no significant blood loss and there were no obvious complications. A post-procedural chest x-ray is pending.  Samples Target #1: 1. Transbronchial Wang needle biopsies from left lung mass 2. Transbronchial forceps biopsies from left lung mass 3. Endobronchial biopsies from left lung mass  EBUS: 1. EBNA from 10mm 7S lymph node with 3 passes 2. EBNA from 15mm 4L lymph node with 3 passes 3. EBNA from 25mm 11L lymph node with 5 passes  Plans:  The patient will be discharged from the PACU to home when recovered from anesthesia and after chest x-ray is reviewed. We will review the cytology, pathology and microbiology results with the patient when they become available. I will arrange outpatient follow up in our clinic.

## 2022-06-12 NOTE — Discharge Instructions (Signed)
-   Normal to have cough with small blood clots, bloody streaks for first 1-3 days after bronchoscopy. If coughing up clot size of your thumb or larger then call our office [phone] and if persistent, especially if you're also developing worsening shortness of breath then would plan to head to ED. - Fever is common in the first 1-2 days after bronchoscopy and often is simply your body's reaction to saline exposure in your lung - just take tylenol. - Will call you in 3-5 business days when results become available  

## 2022-06-12 NOTE — Transfer of Care (Signed)
Immediate Anesthesia Transfer of Care Note  Patient: Barbara Green  Procedure(s) Performed: ROBOTIC ASSISTED NAVIGATIONAL BRONCHOSCOPY VIDEO BRONCHOSCOPY WITH ENDOBRONCHIAL ULTRASOUND BRONCHIAL NEEDLE ASPIRATION BIOPSIES BRONCHIAL BIOPSIES FIDUCIAL MARKER PLACEMENT  Patient Location: PACU  Anesthesia Type:General  Level of Consciousness: drowsy and patient cooperative  Airway & Oxygen Therapy: Patient Spontanous Breathing  Post-op Assessment: Report given to RN and Post -op Vital signs reviewed and stable  Post vital signs: Reviewed and stable  Last Vitals:  Vitals Value Taken Time  BP 135/77   Temp    Pulse 90   Resp 19   SpO2 93     Last Pain:  Vitals:   06/12/22 0939  TempSrc:   PainSc: 0-No pain         Complications: No notable events documented.

## 2022-06-12 NOTE — Anesthesia Postprocedure Evaluation (Signed)
Anesthesia Post Note  Patient: Barbara Green  Procedure(s) Performed: ROBOTIC ASSISTED NAVIGATIONAL BRONCHOSCOPY VIDEO BRONCHOSCOPY WITH ENDOBRONCHIAL ULTRASOUND BRONCHIAL NEEDLE ASPIRATION BIOPSIES BRONCHIAL BIOPSIES FIDUCIAL MARKER PLACEMENT     Patient location during evaluation: PACU Anesthesia Type: General Level of consciousness: awake and alert Pain management: pain level controlled Vital Signs Assessment: post-procedure vital signs reviewed and stable Respiratory status: spontaneous breathing, nonlabored ventilation, respiratory function stable and patient connected to nasal cannula oxygen Cardiovascular status: blood pressure returned to baseline and stable Postop Assessment: no apparent nausea or vomiting Anesthetic complications: no  No notable events documented.  Last Vitals:  Vitals:   06/12/22 1230 06/12/22 1241  BP: (!) 148/74 (!) 151/69  Pulse: 94 92  Resp: 16 19  Temp:  36.4 C  SpO2: 93% 100%    Last Pain:  Vitals:   06/12/22 0939  TempSrc:   PainSc: 0-No pain                 Kennieth Rad

## 2022-06-12 NOTE — Interval H&P Note (Signed)
History and Physical Interval Note:  06/12/2022 10:09 AM  [name]  has presented today for surgery, with the diagnosis of LEFT LOWER LUNG MASS.  The various methods of treatment have been discussed with the patient and family. After consideration of risks, benefits and other options for treatment, the patient has consented to  Procedure(s): ROBOTIC ASSISTED NAVIGATIONAL BRONCHOSCOPY (N/A) VIDEO BRONCHOSCOPY WITH ENDOBRONCHIAL ULTRASOUND (N/A) as a surgical intervention.  The patient's history has been reviewed, patient examined, no change in status, stable for surgery.  I have reviewed the patient's chart and labs.  Questions were answered to the patient's satisfaction.     Barbara Green

## 2022-06-12 NOTE — Anesthesia Procedure Notes (Signed)
Procedure Name: Intubation Date/Time: 06/12/2022 10:44 AM  Performed by: Carlos American, CRNAPre-anesthesia Checklist: Patient identified, Emergency Drugs available, Suction available and Patient being monitored Patient Re-evaluated:Patient Re-evaluated prior to induction Oxygen Delivery Method: Circle System Utilized Preoxygenation: Pre-oxygenation with 100% oxygen Induction Type: IV induction Ventilation: Mask ventilation without difficulty Laryngoscope Size: Mac and 3 Grade View: Grade I Tube type: Oral Tube size: 8.5 mm Number of attempts: 1 Airway Equipment and Method: Stylet Placement Confirmation: ETT inserted through vocal cords under direct vision, positive ETCO2 and breath sounds checked- equal and bilateral Secured at: 20 cm Tube secured with: Tape Dental Injury: Teeth and Oropharynx as per pre-operative assessment

## 2022-06-15 ENCOUNTER — Encounter (HOSPITAL_COMMUNITY): Payer: Self-pay | Admitting: Student

## 2022-06-16 LAB — CYTOLOGY - NON PAP

## 2022-06-17 ENCOUNTER — Ambulatory Visit (INDEPENDENT_AMBULATORY_CARE_PROVIDER_SITE_OTHER): Payer: Medicare HMO | Admitting: Family Medicine

## 2022-06-17 ENCOUNTER — Encounter: Payer: Self-pay | Admitting: Family Medicine

## 2022-06-17 VITALS — BP 133/83 | HR 92 | Temp 98.1°F | Resp 16 | Wt 101.2 lb

## 2022-06-17 DIAGNOSIS — F17200 Nicotine dependence, unspecified, uncomplicated: Secondary | ICD-10-CM | POA: Diagnosis not present

## 2022-06-17 DIAGNOSIS — I10 Essential (primary) hypertension: Secondary | ICD-10-CM

## 2022-06-17 MED ORDER — LISINOPRIL 5 MG PO TABS: 5.0000 mg | ORAL_TABLET | Freq: Every day | ORAL | 1 refills | Status: DC

## 2022-06-18 ENCOUNTER — Telehealth: Payer: Self-pay | Admitting: Student

## 2022-06-18 DIAGNOSIS — C3490 Malignant neoplasm of unspecified part of unspecified bronchus or lung: Secondary | ICD-10-CM

## 2022-06-18 DIAGNOSIS — C3492 Malignant neoplasm of unspecified part of left bronchus or lung: Secondary | ICD-10-CM

## 2022-06-18 NOTE — Telephone Encounter (Signed)
Called and discussed bronch results with pt's daughter Reymundo Poll. Ordered PET/CT and MR brain, referred to oncology, radiation oncology.

## 2022-06-19 ENCOUNTER — Encounter: Payer: Self-pay | Admitting: Family Medicine

## 2022-06-19 ENCOUNTER — Telehealth: Payer: Self-pay | Admitting: Student

## 2022-06-19 MED ORDER — ALPRAZOLAM 0.5 MG PO TABS: 0.5000 mg | ORAL_TABLET | ORAL | 0 refills | Status: DC | PRN

## 2022-06-19 NOTE — Telephone Encounter (Signed)
Dr. Thora Lance, please advise if you are okay sending meds in for pt due to her being claustrophobic.

## 2022-06-19 NOTE — Progress Notes (Signed)
Established Patient Office Visit  Subjective    Patient ID: Barbara Green, female    DOB: 05-30-1940  Age: 82 y.o. MRN: [id number]  CC:  Chief Complaint  Patient presents with   Follow-up   Hypertension    HPI TITIA ODAM presents for follow up of chronic med issues. Patient denies acute complaints or concerns.    Outpatient Encounter Medications as of 06/17/2022  Medication Sig   BONINE 25 MG CHEW Chew 25 mg by mouth daily as needed (for vertigo).   lisinopril (ZESTRIL) 5 MG tablet Take 1 tablet (5 mg total) by mouth daily.   nicotine (NICODERM CQ - DOSED IN MG/24 HOURS) 21 mg/24hr patch Place 1 patch (21 mg total) onto the skin daily.   UNKNOWN TO PATIENT Take 1 tablet by mouth See admin instructions. Unnamed allergy tablet- Take 1 tablet by mouth once a day as needed for seasonal allergies   [DISCONTINUED] lisinopril (ZESTRIL) 5 MG tablet Take 1 tablet (5 mg total) by mouth daily.   No facility-administered encounter medications on file as of 06/17/2022.    Past Medical History:  Diagnosis Date   History of radiation therapy 06/29/19-07/29/19   Right breast IMRT- Dr. Roselind Messier   Hypertension     Past Surgical History:  Procedure Laterality Date   BREAST LUMPECTOMY WITH SENTINEL LYMPH NODE BIOPSY Right 05/17/2019   Procedure: RIGHT BREAST LUMPECTOMY WITH SENTINEL LYMPH NODE BIOPSY;  Surgeon: Abigail Miyamoto, MD;  Location: [facility];  Service: General;  Laterality: Right;   BRONCHIAL BIOPSY  06/12/2022   Procedure: BRONCHIAL BIOPSIES;  Surgeon: Omar Person, MD;  Location: [facility] ENDOSCOPY;  Service: Pulmonary;;   BRONCHIAL NEEDLE ASPIRATION BIOPSY  06/12/2022   Procedure: BRONCHIAL NEEDLE ASPIRATION BIOPSIES;  Surgeon: Omar Person, MD;  Location: [facility] ENDOSCOPY;  Service: Pulmonary;;   FIDUCIAL MARKER PLACEMENT  06/12/2022   Procedure: FIDUCIAL MARKER PLACEMENT;  Surgeon: Omar Person, MD;  Location: [facility] ENDOSCOPY;  Service: Pulmonary;;   NO  PAST SURGERIES     VIDEO BRONCHOSCOPY WITH ENDOBRONCHIAL ULTRASOUND N/A 06/12/2022   Procedure: VIDEO BRONCHOSCOPY WITH ENDOBRONCHIAL ULTRASOUND;  Surgeon: Omar Person, MD;  Location: [facility] ENDOSCOPY;  Service: Pulmonary;  Laterality: N/A;    Family History  Problem Relation Age of Onset   Alzheimer's disease Mother    Heart disease Father    Alcohol abuse Father     Social History   Socioeconomic History   Marital status: Widowed    Spouse name: Not on file   Number of children: 3   Years of education: Not on file   Highest education level: 12th grade  Occupational History   Not on file  Tobacco Use   Smoking status: Some Days    Packs/day: 0.33    Years: 30.00    Additional pack years: 0.00    Total pack years: 9.90    Types: Cigarettes    Last attempt to quit: 05/2022    Years since quitting: 0.0    Passive exposure: Current   Smokeless tobacco: Never  Vaping Use   Vaping Use: Never used  Substance and Sexual Activity   Alcohol use: Yes    Comment: occasionally a glass wine   Drug use: Never   Sexual activity: Not on file  Other Topics Concern   Not on file  Social History Narrative   Not on file   Social Determinants of Health   Financial Resource Strain: Low Risk  (02/13/2022)  Overall Financial Resource Strain (CARDIA)    Difficulty of Paying Living Expenses: Not hard at all  Food Insecurity: No Food Insecurity (05/29/2022)   Hunger Vital Sign    Worried About Running Out of Food in the Last Year: Never true    Ran Out of Food in the Last Year: Never true  Transportation Needs: No Transportation Needs (05/29/2022)   PRAPARE - Administrator, Civil Service (Medical): No    Lack of Transportation (Non-Medical): No  Physical Activity: Inactive (02/13/2022)   Exercise Vital Sign    Days of Exercise per Week: 0 days    Minutes of Exercise per Session: 0 min  Stress: No Stress Concern Present (02/13/2022)   [employer] of Occupational  Health - Occupational Stress Questionnaire    Feeling of Stress : Not at all  Social Connections: Not on file  Intimate Partner Violence: Not At Risk (05/29/2022)   Humiliation, Afraid, Rape, and Kick questionnaire    Fear of Current or Ex-Partner: No    Emotionally Abused: No    Physically Abused: No    Sexually Abused: No    Review of Systems  All other systems reviewed and are negative.       Objective    BP 133/83   Pulse 92   Temp 98.1 F (36.7 C) (Oral)   Resp 16   Wt 101 lb 3.2 oz (45.9 kg)   SpO2 93%   BMI 19.76 kg/m   Physical Exam Vitals and nursing note reviewed.  Constitutional:      General: She is not in acute distress. Cardiovascular:     Rate and Rhythm: Normal rate and regular rhythm.  Pulmonary:     Effort: Pulmonary effort is normal.     Breath sounds: Normal breath sounds.  Abdominal:     Palpations: Abdomen is soft.     Tenderness: There is no abdominal tenderness.  Neurological:     General: No focal deficit present.     Mental Status: She is alert and oriented to person, place, and time.         Assessment & Plan:   1. Essential hypertension Appears stable. Continue   2. Tobacco dependence Discussed reduction/cessation    Return in about 6 months (around 12/18/2022) for follow up.   Tommie Raymond, MD

## 2022-06-20 ENCOUNTER — Telehealth: Payer: Self-pay | Admitting: Internal Medicine

## 2022-06-20 NOTE — Telephone Encounter (Signed)
scheduled per referral, pt has been called and confirmed date and time. Pt is aware of location and to arrive early for check in   

## 2022-06-26 ENCOUNTER — Other Ambulatory Visit: Payer: Self-pay

## 2022-06-27 ENCOUNTER — Ambulatory Visit (HOSPITAL_COMMUNITY): Payer: Medicare HMO

## 2022-06-27 ENCOUNTER — Encounter (HOSPITAL_COMMUNITY): Payer: Self-pay

## 2022-06-27 NOTE — Progress Notes (Signed)
The proposed treatment discussed in conference is for discussion purpose only and is not a binding recommendation.  The patients have not been physically examined, or presented with their treatment options.  Therefore, final treatment plans cannot be decided.  

## 2022-07-02 NOTE — Progress Notes (Signed)
Thoracic Location of Tumor / Histology: Non-small cell cancer of left lung  Pt to have PET scan on 07-08-22  05/29/2022  IMPRESSION: 1. 4.3 by 4.5 cm left lower lobe mass, abutting the pleural margin but without definite chest wall invasion. Mildly enlarged left infrahilar lymph node. 2. Small type 1 hiatal hernia. 3. Free osteochondral fragments in the subscapular recess of both glenohumeral joints. 4. Aortic atherosclerosis. 5. Emphysema.   Patient presented with symptoms of: Chest x-ray with a left lower lobe mass that prompted CT chest. This showed a cavitating centrally necrotic left lower lobe mass with left hilar adenopathy.   Biopsies revealed:  06-12-22 FINAL MICROSCOPIC DIAGNOSIS:  A. LUNG, LLL, FINE NEEDLE ASPIRATION:  - Non-small cell carcinoma  - See comment   FINAL MICROSCOPIC DIAGNOSIS:  B. LYMPH NODE, 7, FINE NEEDLE ASPIRATION:  - No malignant cells identified  - See comment   C. LYMPH NODE, 4L, FINE NEEDLE ASPIRATION:  - No malignant cells identified  - See comment   D. LYMPH NODE, 11L, FINE NEEDLE ASPIRATION:  - Non-small cell carcinoma  - See comment   COMMENT:  The tumor cells present in the lymph node (part D, 11 L) are  morphologically similar to those seen in the left lower lobe mass  ([id number]).  Dr. Luisa Hart reviewed the case and agrees with the above  diagnosis.  Dr. Thora Lance was notified of the diagnosis on 06/16/2022 at  11:23 AM.   Tobacco/Marijuana/Snuff/ETOH use: 82 year old woman with a history of longstanding tobacco use (30 pack years), still smokes a few cigarettes daily. (Per Desai's PA note on 05-29-22)  Past/Anticipated interventions by cardiothoracic surgery, if any:  Video Bronchoscopy with Robotic Assisted Bronchoscopic Navigation    Date of Operation: 06/12/2022    Pre-op Diagnosis: Left lung mass   Post-op Diagnosis: same   Surgeon: Judithann Graves  Past/Anticipated interventions by medical oncology, if any: Pt to see medical  oncology on 07-07-22  Signs/Symptoms Weight changes, if any: no Wt Readings from Last 3 Encounters:  07/16/22 100 lb 4 oz (45.5 kg)  07/07/22 97 lb 12.8 oz (44.4 kg)  06/17/22 101 lb 3.2 oz (45.9 kg)    Respiratory complaints, if any: yes, dry cough. Shortness of breath on exertion.  Hemoptysis, if any: no Pain issues, if any:  no  SAFETY ISSUES: Prior radiation? Right breast cancer xrt in 2021 Pacemaker/ICD? no  Possible current pregnancy?no Is the patient on methotrexate? no  Current Complaints / other details:     BP 133/76 (BP Location: Left Arm, Patient Position: Sitting)   Pulse (!) 113   Temp (!) 97.3 F (36.3 C) (Temporal)   Resp 18   Ht 5' (1.524 m)   Wt 100 lb 4 oz (45.5 kg)   SpO2 100%   BMI 19.58 kg/m

## 2022-07-03 ENCOUNTER — Telehealth: Payer: Self-pay | Admitting: Medical Oncology

## 2022-07-03 ENCOUNTER — Other Ambulatory Visit: Payer: Self-pay | Admitting: Medical Oncology

## 2022-07-03 DIAGNOSIS — R918 Other nonspecific abnormal finding of lung field: Secondary | ICD-10-CM

## 2022-07-03 DIAGNOSIS — Z170 Estrogen receptor positive status [ER+]: Secondary | ICD-10-CM

## 2022-07-03 NOTE — Telephone Encounter (Signed)
Confirmed appts for June 10th .   Of concern is the pt received a message from " the cancer center infusion appt and your infusion appt is June 10th at 1300. The pt is very upset. I explained the appt to dtr and give her mother the correct information.

## 2022-07-04 NOTE — Progress Notes (Unsigned)
[facility] [phone]   [phone]  CONSULT NOTE  REFERRING PHYSICIAN: Dr. Thora Lance   REASON FOR CONSULTATION:  Non-Small Cell Lung Cancer, Squamous Cell Carcinoma   HPI Barbara Green is a 82 y.o. female with a past medical history of HTN, stage IA (T1c, N0, M0) ER+, PR+, Her2 - breast cancer (s/p lumpectomy and adjuvant radiation from 6/2-07/29/19), and *** is referred to the clinic for newly diagnosed non-small cell lung cancer, squamous cell carcinoma.  The patient has a history of right-sided breast cancer status postlumpectomy and adjuvant radiation.  The patient was seen 1 time by Dr. Mosetta Putt who recommended consideration of AI following radiation.  The patient canceled her appointment and stated she was not going to take AI refused regular follow-up.  In late April and early May, the patient was endorsing dizziness secondary to hyponatremia.  She presented to an urgent care on 05/26/2022 and admission was recommended but the patient left AMA.  She presented again on 05/29/2022 with similar symptoms of dizziness and staggering gait and which had been occurring for several months.  Per chart review, it appears that the patient's sodium levels have been gradually reducing over the last 8 months.  She had a chest x-ray that showed left-sided lung mass and the patient subsequently had a CT chest, abdomen, and pelvis on 05/29/2022 showing a large 4.8 x 4.1 cm truly necrotic left lower lobe lung mass which is worrisome for neoplasm.  Given the patient's history of breast cancer could be metastatic lesion but based on the appearance also could be a primary lung malignancy.  There is also metastatic left hilar lymph nodes few prominent borderline mediastinal lymph nodes.  He subsequently had a CT super D chest without contrast on 05/29/2022 showing a 4.3 x 4.5 left lower lobe mass abutting the pleural margin without definite chest wall invasion and mildly enlarged left  infrahilar lymph nodes.  Debridement will arrange for the patient to have EBUS on 06/12/2022 by his colleague Dr. Thora Lance.  The final pathology ([id number]) of the left lower lobe FNA showed non-small cell lung cancer, squamous cell carcinoma.   She is scheduled for PET scan tomorrow on 07/08/22. She had a brain MRI earlier today and the final report is not available at this time. Her PDL1 expression is 2%.   Overall, the patient is feeling *** today ***  Patient's case was discussed at the multidisciplinary conference on 06/26/2022 and recommended chemoradiation.   Overall, the patient is feeling ***today.  She denies any fever, chills, night sweats, or unexplained weight loss.  Cough, shortness of breath?  Denies any chest pain or hemoptysis.  Denies any nausea, vomiting, diarrhea, or constipation.  Denies any headache or visual changes.   HPI  Past Medical History:  Diagnosis Date   History of radiation therapy 06/29/19-07/29/19   Right breast IMRT- Dr. Roselind Messier   Hypertension     Past Surgical History:  Procedure Laterality Date   BREAST LUMPECTOMY WITH SENTINEL LYMPH NODE BIOPSY Right 05/17/2019   Procedure: RIGHT BREAST LUMPECTOMY WITH SENTINEL LYMPH NODE BIOPSY;  Surgeon: Abigail Miyamoto, MD;  Location: [facility];  Service: General;  Laterality: Right;   BRONCHIAL BIOPSY  06/12/2022   Procedure: BRONCHIAL BIOPSIES;  Surgeon: Omar Person, MD;  Location: [facility] ENDOSCOPY;  Service: Pulmonary;;   BRONCHIAL NEEDLE ASPIRATION BIOPSY  06/12/2022   Procedure: BRONCHIAL NEEDLE ASPIRATION BIOPSIES;  Surgeon: Omar Person, MD;  Location[facility] ENDOSCOPY;  Service:  Pulmonary;;   FIDUCIAL MARKER PLACEMENT  06/12/2022   Procedure: FIDUCIAL MARKER PLACEMENT;  Surgeon: Omar Person, MD;  Location: [facility] ENDOSCOPY;  Service: Pulmonary;;   NO PAST SURGERIES     VIDEO BRONCHOSCOPY WITH ENDOBRONCHIAL ULTRASOUND N/A 06/12/2022   Procedure: VIDEO BRONCHOSCOPY WITH ENDOBRONCHIAL  ULTRASOUND;  Surgeon: Omar Person, MD;  Location: [facility] ENDOSCOPY;  Service: Pulmonary;  Laterality: N/A;    Family History  Problem Relation Age of Onset   Alzheimer's disease Mother    Heart disease Father    Alcohol abuse Father     Social History Social History   Tobacco Use   Smoking status: Some Days    Packs/day: 0.33    Years: 30.00    Additional pack years: 0.00    Total pack years: 9.90    Types: Cigarettes    Last attempt to quit: 05/2022    Years since quitting: 0.1    Passive exposure: Current   Smokeless tobacco: Never  Vaping Use   Vaping Use: Never used  Substance Use Topics   Alcohol use: Yes    Comment: occasionally a glass wine   Drug use: Never    No Known Allergies  Current Outpatient Medications  Medication Sig Dispense Refill   ALPRAZolam (XANAX) 0.5 MG tablet Take 1 tablet (0.5 mg total) by mouth every 5 (five) minutes as needed for up to 3 doses for anxiety (for MRI). 3 tablet 0   BONINE 25 MG CHEW Chew 25 mg by mouth daily as needed (for vertigo).     lisinopril (ZESTRIL) 5 MG tablet Take 1 tablet (5 mg total) by mouth daily. 90 tablet 1   nicotine (NICODERM CQ - DOSED IN MG/24 HOURS) 21 mg/24hr patch Place 1 patch (21 mg total) onto the skin daily. 28 patch 0   UNKNOWN TO PATIENT Take 1 tablet by mouth See admin instructions. Unnamed allergy tablet- Take 1 tablet by mouth once a day as needed for seasonal allergies     No current facility-administered medications for this visit.    REVIEW OF SYSTEMS:   Review of Systems  Constitutional: Negative for appetite change, chills, fatigue, fever and unexpected weight change.  HENT:   Negative for mouth sores, nosebleeds, sore throat and trouble swallowing.   Eyes: Negative for eye problems and icterus.  Respiratory: Negative for cough, hemoptysis, shortness of breath and wheezing.   Cardiovascular: Negative for chest pain and leg swelling.  Gastrointestinal: Negative for abdominal pain,  constipation, diarrhea, nausea and vomiting.  Genitourinary: Negative for bladder incontinence, difficulty urinating, dysuria, frequency and hematuria.   Musculoskeletal: Negative for back pain, gait problem, neck pain and neck stiffness.  Skin: Negative for itching and rash.  Neurological: Negative for dizziness, extremity weakness, gait problem, headaches, light-headedness and seizures.  Hematological: Negative for adenopathy. Does not bruise/bleed easily.  Psychiatric/Behavioral: Negative for confusion, depression and sleep disturbance. The patient is not nervous/anxious.     PHYSICAL EXAMINATION:  There were no vitals taken for this visit.  ECOG PERFORMANCE STATUS: {CHL ONC ECOG [id number]  Physical Exam  Constitutional: Oriented to person, place, and time and well-developed, well-nourished, and in no distress. No distress.  HENT:  Head: Normocephalic and atraumatic.  Mouth/Throat: Oropharynx is clear and moist. No oropharyngeal exudate.  Eyes: Conjunctivae are normal. Right eye exhibits no discharge. Left eye exhibits no discharge. No scleral icterus.  Neck: Normal range of motion. Neck supple.  Cardiovascular: Normal rate, regular rhythm, normal heart sounds and intact distal pulses.  Pulmonary/Chest: Effort normal and breath sounds normal. No respiratory distress. No wheezes. No rales.  Abdominal: Soft. Bowel sounds are normal. Exhibits no distension and no mass. There is no tenderness.  Musculoskeletal: Normal range of motion. Exhibits no edema.  Lymphadenopathy:    No cervical adenopathy.  Neurological: Alert and oriented to person, place, and time. Exhibits normal muscle tone. Gait normal. Coordination normal.  Skin: Skin is warm and dry. No rash noted. Not diaphoretic. No erythema. No pallor.  Psychiatric: Mood, memory and judgment normal.  Vitals reviewed.  LABORATORY DATA: Lab Results  Component Value Date   WBC 9.9 05/29/2022   HGB 16.0 (H) 06/12/2022   HCT  47.0 (H) 06/12/2022   MCV 86.6 05/29/2022   PLT 463 (H) 05/29/2022      Chemistry      Component Value Date/Time   NA 124 (L) 06/12/2022 0959   NA 124 (L) 05/26/2022 1323   K 3.8 06/12/2022 0959   CL 87 (L) 06/12/2022 0959   CO2 23 05/30/2022 0553   BUN 3 (L) 06/12/2022 0959   BUN 7 (L) 05/26/2022 1323   CREATININE 0.30 (L) 06/12/2022 0959      Component Value Date/Time   CALCIUM 9.3 05/30/2022 0553   ALKPHOS 115 05/28/2022 1110   AST 15 05/28/2022 1110   ALT 8 05/28/2022 1110   BILITOT 0.7 05/28/2022 1110   BILITOT 0.6 05/26/2022 1323       RADIOGRAPHIC STUDIES: DG Chest Port 1 View  Result Date: 06/12/2022 CLINICAL DATA:  Congestive heart failure.  Status post bronchoscopy. EXAM: PORTABLE CHEST 1 VIEW COMPARISON:  One-view chest x-ray 05/29/2022. FINDINGS: Heart size is normal. Left infrahilar mass lesion is stable. Atherosclerotic changes are present at the aortic arch. Chronic interstitial coarsening is present bilaterally. No pneumothorax or significant hemorrhage is evident. The visualized soft tissues and bony thorax are unremarkable. IMPRESSION: 1. Stable left infrahilar mass lesion. 2. No pneumothorax or significant hemorrhage following bronchoscopy. 3. Chronic interstitial coarsening. Electronically Signed   By: Marin Roberts M.D.   On: 06/12/2022 12:37   DG C-ARM BRONCHOSCOPY  Result Date: 06/12/2022 C-ARM BRONCHOSCOPY: Fluoroscopy was utilized by the requesting physician.  No radiographic interpretation.    ASSESSMENT: This very pleasant 82 year old female diagnosed with at least stage IIIA (T3, N1, Mx) non-small cell lung cancer, squamous cell carcinoma.  She presented with a left lower lobe mass and mildly enlarged left infrahilar lymph nodes.  Her PET scan is scheduled for tomorrow 07/08/2022.  Her PD-L1 expression is 2%.  Her brain MRI results are pending.  The patient was seen with Dr. Arbutus Ped today.  Dr. Arbutus Ped had a lengthy discussion with the patient  today about her current condition and recommended treatment options.  Her case was discussed at the multidisciplinary conference on 06/26/2022.  If this is consistent with stage III lung cancer, we discussed treatment options of concurrent chemoradiation with carboplatin for an AUC of 2 and paclitaxel 45 mg/m given weekly x 6 with concurrent radiation.  The patient is interested in this option and we will tentatively arrange for her to start her first cycle of treatment on 07/21/2022.  The adverse side effects of treatment were discussed including but not limited to nausea, vomiting, myelosuppression, fatigue, peripheral neuropathy, kidney, and liver dysfunction.  I will place a referral to Dr. Roselind Messier and radiation oncology, who she has seen previously for her history of breast cancer.  I will arrange for chemo education class prior to starting her first cycle of  treatment.  I have sent her prescription for Compazine 10 mg p.o. every 6 hours as needed for nausea and vomiting  We will see the patient back for follow-up visit in ***he comes in for her first cycle of treatment.       Note the patient has a history of stage Ia ER positive, PR positive, HER2 negative right-sided breast cancer status post lobectomy and adjuvant radiation that was completed on 07/29/2019 by Dr. Roselind Messier.  The patient refused AI and follow-up with medical oncology  The patient voices understanding of current disease status and treatment options and is in agreement with the current care plan.  All questions were answered. The patient knows to call the clinic with any problems, questions or concerns. We can certainly see the patient much sooner if necessary.  Thank you so much for allowing me to participate in the care of Barbara Green. I will continue to follow up the patient with you and assist in her care.  I spent {CHL ONC TIME VISIT - NWGNF:[id number]} counseling the patient face to face. The total time spent in the  appointment was {CHL ONC TIME VISIT - IONGE:[id number]}.  Disclaimer: This note was dictated with voice recognition software. Similar sounding words can inadvertently be transcribed and may not be corrected upon review.   Konnar Ben L Lotus Gover July 04, 2022, 8:24 AM

## 2022-07-07 ENCOUNTER — Ambulatory Visit (HOSPITAL_COMMUNITY)
Admission: RE | Admit: 2022-07-07 | Discharge: 2022-07-07 | Disposition: A | Discharge status: Home / Self Care | Payer: Medicare HMO | Source: Ambulatory Visit | Attending: Student | Admitting: Student

## 2022-07-07 ENCOUNTER — Encounter: Payer: Self-pay | Admitting: Internal Medicine

## 2022-07-07 ENCOUNTER — Inpatient Hospital Stay: Payer: Medicare HMO | Admitting: Physician Assistant

## 2022-07-07 ENCOUNTER — Inpatient Hospital Stay: Payer: Medicare HMO | Attending: Physician Assistant

## 2022-07-07 ENCOUNTER — Encounter (HOSPITAL_COMMUNITY): Payer: Self-pay

## 2022-07-07 VITALS — BP 164/92 | HR 100 | Temp 97.9°F | Resp 16 | Wt 97.8 lb

## 2022-07-07 DIAGNOSIS — Z79899 Other long term (current) drug therapy: Secondary | ICD-10-CM | POA: Insufficient documentation

## 2022-07-07 DIAGNOSIS — C3492 Malignant neoplasm of unspecified part of left bronchus or lung: Secondary | ICD-10-CM | POA: Diagnosis present

## 2022-07-07 DIAGNOSIS — R918 Other nonspecific abnormal finding of lung field: Secondary | ICD-10-CM

## 2022-07-07 DIAGNOSIS — Z853 Personal history of malignant neoplasm of breast: Secondary | ICD-10-CM | POA: Diagnosis not present

## 2022-07-07 DIAGNOSIS — C3490 Malignant neoplasm of unspecified part of unspecified bronchus or lung: Secondary | ICD-10-CM | POA: Diagnosis present

## 2022-07-07 DIAGNOSIS — C3432 Malignant neoplasm of lower lobe, left bronchus or lung: Secondary | ICD-10-CM | POA: Insufficient documentation

## 2022-07-07 DIAGNOSIS — R059 Cough, unspecified: Secondary | ICD-10-CM

## 2022-07-07 DIAGNOSIS — R599 Enlarged lymph nodes, unspecified: Secondary | ICD-10-CM

## 2022-07-07 DIAGNOSIS — E871 Hypo-osmolality and hyponatremia: Secondary | ICD-10-CM

## 2022-07-07 DIAGNOSIS — Z923 Personal history of irradiation: Secondary | ICD-10-CM

## 2022-07-07 DIAGNOSIS — Z170 Estrogen receptor positive status [ER+]: Secondary | ICD-10-CM | POA: Diagnosis not present

## 2022-07-07 DIAGNOSIS — C50211 Malignant neoplasm of upper-inner quadrant of right female breast: Secondary | ICD-10-CM

## 2022-07-07 DIAGNOSIS — Z7189 Other specified counseling: Secondary | ICD-10-CM | POA: Insufficient documentation

## 2022-07-07 LAB — CMP (CANCER CENTER ONLY)
ALT: 10 U/L (ref 0–44)
AST: 15 U/L (ref 15–41)
Albumin: 4.1 g/dL (ref 3.5–5.0)
Alkaline Phosphatase: 119 U/L (ref 38–126)
Anion gap: 7 (ref 5–15)
BUN: 5 mg/dL — ABNORMAL LOW (ref 8–23)
CO2: 28 mmol/L (ref 22–32)
Calcium: 9.7 mg/dL (ref 8.9–10.3)
Chloride: 88 mmol/L — ABNORMAL LOW (ref 98–111)
Creatinine: 0.38 mg/dL — ABNORMAL LOW (ref 0.44–1.00)
GFR, Estimated: >60 mL/min (ref >60)
Glucose, Bld: 105 mg/dL — ABNORMAL HIGH (ref 70–99)
Potassium: 4.2 mmol/L (ref 3.5–5.1)
Sodium: 123 mmol/L — ABNORMAL LOW (ref 135–145)
Total Bilirubin: 0.8 mg/dL (ref 0.3–1.2)
Total Protein: 7.2 g/dL (ref 6.5–8.1)

## 2022-07-07 LAB — CBC WITH DIFFERENTIAL (CANCER CENTER ONLY)
Abs Immature Granulocytes: 0.03 10*3/uL (ref 0.00–0.07)
Basophils Absolute: 0.1 10*3/uL (ref 0.0–0.1)
Basophils Relative: 1 %
Eosinophils Absolute: 0.1 10*3/uL (ref 0.0–0.5)
Eosinophils Relative: 1 %
HCT: 41.7 % (ref 36.0–46.0)
Hemoglobin: 14.1 g/dL (ref 12.0–15.0)
Immature Granulocytes: 0 %
Lymphocytes Relative: 25 %
Lymphs Abs: 2.7 10*3/uL (ref 0.7–4.0)
MCH: 29.4 pg (ref 26.0–34.0)
MCHC: 33.8 g/dL (ref 30.0–36.0)
MCV: 86.9 fL (ref 80.0–100.0)
Monocytes Absolute: 1.3 10*3/uL — ABNORMAL HIGH (ref 0.1–1.0)
Monocytes Relative: 12 %
Neutro Abs: 6.9 10*3/uL (ref 1.7–7.7)
Neutrophils Relative %: 61 %
Platelet Count: 427 10*3/uL — ABNORMAL HIGH (ref 150–400)
RBC: 4.8 MIL/uL (ref 3.87–5.11)
RDW: 13.5 % (ref 11.5–15.5)
WBC Count: 11 10*3/uL — ABNORMAL HIGH (ref 4.0–10.5)
nRBC: 0 % (ref 0.0–0.2)

## 2022-07-07 MED ORDER — BENZONATATE 100 MG PO CAPS
100.0000 mg | ORAL_CAPSULE | Freq: Three times a day (TID) | ORAL | 2 refills | Status: DC | PRN
Start: 2022-07-07 — End: 2022-08-04

## 2022-07-07 MED ORDER — PROCHLORPERAZINE MALEATE 10 MG PO TABS
10.0000 mg | ORAL_TABLET | Freq: Four times a day (QID) | ORAL | 2 refills | Status: AC | PRN
Start: 2022-07-07 — End: ?

## 2022-07-07 MED ORDER — GADOBUTROL 1 MMOL/ML IV SOLN
4.5000 mL | Freq: Once | INTRAVENOUS | Status: AC | PRN
  Administered 2022-07-07: 4.5 mL via INTRAVENOUS

## 2022-07-07 MED ORDER — DEMECLOCYCLINE HCL 150 MG PO TABS
150.0000 mg | ORAL_TABLET | Freq: Two times a day (BID) | ORAL | 0 refills | Status: DC
Start: 2022-07-07 — End: 2022-08-04

## 2022-07-07 NOTE — Progress Notes (Signed)
START ON PATHWAY REGIMEN - Non-Small Cell Lung     A cycle is every 7 days, concurrent with RT:     Paclitaxel      Carboplatin   **Always confirm dose/schedule in your pharmacy ordering system**  Patient Characteristics: Preoperative or Nonsurgical Candidate (Clinical Staging), Stage III - Nonsurgical Candidate (Nonsquamous and Squamous), PS = 0, 1 Therapeutic Status: Preoperative or Nonsurgical Candidate (Clinical Staging) AJCC T Category: cT2b AJCC N Category: cN2 AJCC M Category: cM0 AJCC 8 Stage Grouping: IIIA ECOG Performance Status: 1 Intent of Therapy: Curative Intent, Discussed with Patient 

## 2022-07-07 NOTE — Patient Instructions (Addendum)
-  There are two main categories of lung cancer, they are named based on the size of the cancer cell. One is called Non-Small cell lung cancer. The other type is Small Cell Lung Cancer -The sample (biopsy) that they took of your tumor was consistent with a subtype of Non-small cell lung cancer called Squamous Cell Carcinoma   -We covered a lot of important information at your appointment today regarding what the treatment plan is moving forward. Here are the the main points that were discussed at your office visit with [place] today:  -So far, this looks like it could be stage II or stage III. As long as it is stage II or stage III, the treatment is listed below -The treatment that you will receive consists of two chemotherapy drugs called Carboplatin and Paclitaxel (also called Taxol) -We are planning on starting your treatment next week on 07/21/22 but before your start your treatment, I would like you to attend a Chemotherapy Education Class. This involves having you sit down with one of our nurse educators. She will discuss with you one-on-one more details about your treatment as well as general information about resources here at the [facility].  -Your treatment will be given every week for about 6 weeks or so (as long as you are also receiving radiation). We will check your labs (blood work) once a week . Dr. Arbutus Ped or I will see you every other treatment just to make sure that you are doing well and that everything is on track. -We will get a CT scan about 3 weeks after you complete your treatment  Medications:  -Compazine was sent to your pharmacy. This medication is for nausea. You may take this every 6 hours as needed if you feel nausous.  -Please pick up Demeclocycline, this is to be taken twice a day. Please also continue to drink salt water and limit free water intake   Referrals -I will place the referral to radiation oncology to meet with you to discuss starting radiation treatment. Please  be on the lookout for a phone call from them to schedule a consultation.  -Keep the scan appointments as scheduled. -I will refer you to a member of the nutritionist team  Follow Up:  -We will see you back for a follow up visit in 2 weeks with your first treatment

## 2022-07-07 NOTE — Progress Notes (Signed)
NN met with the pt face to face today at her initial med onc appt with C.Heilingoetter, Onc PA and Dr.Mohamed. Pt was accompanied by her daughter, Darl Pikes.  The plan for the concurrent chemoradiation to start around 6/24. Pt understands the treatment plan is in agreement with it. Darl Pikes states that the pt will have a ride for chemotherapy treatments and the pt is able to drive herself to her radiation appointments. Pt and her daughter deny pt have any financial issues at the moment do not expect the pt to have any once her treatment starts. Darl Pikes lives in [place], pt also has a son in [place] and a niece in [place], both of whom Darl Pikes states have been willing to help the pt in any way they can.  NN provided card to pt and her daughter and encouraged the pt and/or Darl Pikes to reach out with any questions or concerns.  NN escorted the pt and Darl Pikes to a scheduler to arrange a chemotherapy education appt.  All questions answered.

## 2022-07-08 ENCOUNTER — Telehealth: Payer: Self-pay | Admitting: *Deleted

## 2022-07-08 ENCOUNTER — Other Ambulatory Visit: Payer: Self-pay

## 2022-07-08 ENCOUNTER — Inpatient Hospital Stay: Payer: Medicare HMO

## 2022-07-08 ENCOUNTER — Telehealth: Payer: Self-pay | Admitting: Physician Assistant

## 2022-07-08 ENCOUNTER — Encounter (HOSPITAL_COMMUNITY)
Admission: RE | Admit: 2022-07-08 | Discharge: 2022-07-08 | Disposition: A | Discharge status: Home / Self Care | Payer: Medicare HMO | Source: Ambulatory Visit | Attending: Student | Admitting: Student

## 2022-07-08 DIAGNOSIS — C3490 Malignant neoplasm of unspecified part of unspecified bronchus or lung: Secondary | ICD-10-CM | POA: Diagnosis present

## 2022-07-08 DIAGNOSIS — C3492 Malignant neoplasm of unspecified part of left bronchus or lung: Secondary | ICD-10-CM | POA: Insufficient documentation

## 2022-07-08 LAB — GLUCOSE, CAPILLARY: Glucose-Capillary: 127 mg/dL — ABNORMAL HIGH (ref 70–99)

## 2022-07-08 MED ORDER — FLUDEOXYGLUCOSE F - 18 (FDG) INJECTION
5.0000 | Freq: Once | INTRAVENOUS | Status: AC | PRN
  Administered 2022-07-08: 4.98 via INTRAVENOUS

## 2022-07-08 NOTE — Telephone Encounter (Signed)
Received vm message from [place] in Registration that pt cancelled for today.  Message sent to schedulers to r/s for education class.

## 2022-07-08 NOTE — Telephone Encounter (Signed)
Patient's child is aware of upcoming appointment dates/times. Also they are aware of rescheduled appointment times

## 2022-07-09 ENCOUNTER — Inpatient Hospital Stay: Payer: Medicare HMO

## 2022-07-09 ENCOUNTER — Telehealth: Payer: Self-pay | Admitting: Internal Medicine

## 2022-07-09 ENCOUNTER — Other Ambulatory Visit: Payer: Self-pay

## 2022-07-09 NOTE — Telephone Encounter (Signed)
Scheduled per 06/10 los, patient has been called and notified regarding June/July/August appointments.

## 2022-07-10 ENCOUNTER — Ambulatory Visit: Payer: Self-pay | Admitting: Radiation Oncology

## 2022-07-11 ENCOUNTER — Inpatient Hospital Stay: Payer: Medicare HMO | Admitting: Dietician

## 2022-07-11 ENCOUNTER — Other Ambulatory Visit: Payer: Self-pay

## 2022-07-11 NOTE — Progress Notes (Signed)
Nutrition Assessment   Reason for Assessment: Referral (poor appetite, hyponatremia)   ASSESSMENT: 82 year old female with newly diagnosed stage III non-small cell SCC. She is planning chemoradiation with weekly carboplatin + paclitaxel (tentative 6/24). Patient is under the care of Dr. Arbutus Ped and Dr. Roselind Messier.   Past medical history of right-sided breast cancer s/p adjuvant radiation (completed 07/29/19 under the care of Dr. Roselind Messier, HTN  5/2-5/3 admission - dizziness/hyponatremia (Na 121)  Met with patient and daughter Darl Pikes) in office. Patient reports ongoing intermittent dizziness. She would like to walk in her yard for exercise, however afraid of falling. She is compliant with free water restriction. Has sips of water with medication. She is drinking coffee, gatorade, pedialyte. Patient is not a big eater at baseline. Typically eats one meal and snacks. Patient has one cup of coffee in the morning. She will snack during the day (peanut butter crackers, cheese/crackers). Patient recalls meat and veggies for dinner. Had BBQ sandwich with slaw last night. Daughter purchased Ensure Max for added protein. Reports pt took one sip and handed it back. Patient has occasional nausea. Denies vomiting, diarrhea, constipation. Patient and daughter are questioning recommended daily fluid intake. Patient instructed to drink 64 ounces of water daily per chemo ed, however pt placed on free water restriction (only sips of water + other fluids) at hospital discharge. Patient is hesitant to increase water as this caused worsening hyponatremia.  Nutrition Focused Physical Exam: deferred   Medications: declomycin, zestril, compazine, tessalon   Labs: 6/10 - Na 123, glucose 105, BUN 5, Cr 0.38   Anthropometrics: Weights have decreased 4% from 101 lb 3.2 oz on 5/21 - this is severe for time frame  Height: 5 Weight: 97 lb 12.8 oz (6/10) UBW: 110-115 lb (last 12 months) BMI: 19.10    NUTRITION DIAGNOSIS:  Food and nutrition related knowledge deficit related to newly diagnosed cancer as evidenced by no prior need for associated nutrition education   INTERVENTION:  Educated on importance of adequate calories and protein energy intake to maintain strength/weights during treatment Discussed strategies for poor appetite, educated on small frequent meals/snacks, eating by the clock, ways to add calories/protein to foods Encouraged daily oral nutrition supplement, suggested switching to Ensure Plus HP/equivalent for added calories - samples of Ensure Plus HP, Boost Plus, CIB powder + coupons for pt to try Educated on hyponatremia/SIADH, recommend continuing current free water restriction until confirming recommended daily intake per MD - inbasket sent to PA-C Continue salt tablets BID as prescribed High calorie recipes, shake recipes, soft moist high protein food list, snack ideas, cookbook provided today Contact information provided   MONITORING, EVALUATION, GOAL: weight trends, intake, labs   Next Visit: Monday June 24 during infusion with Drema Halon

## 2022-07-12 ENCOUNTER — Encounter: Payer: Self-pay | Admitting: Physician Assistant

## 2022-07-14 ENCOUNTER — Ambulatory Visit: Payer: Medicare HMO | Admitting: Internal Medicine

## 2022-07-14 ENCOUNTER — Other Ambulatory Visit: Payer: Medicare HMO

## 2022-07-14 NOTE — Progress Notes (Signed)
Pharmacist Chemotherapy Monitoring - Initial Assessment    Anticipated start date: 07/21/22   The following has been reviewed per standard work regarding the patient's treatment regimen: The patient's diagnosis, treatment plan and drug doses, and organ/hematologic function Lab orders and baseline tests specific to treatment regimen  The treatment plan start date, drug sequencing, and pre-medications Prior authorization status  Patient's documented medication list, including drug-drug interaction screen and prescriptions for anti-emetics and supportive care specific to the treatment regimen The drug concentrations, fluid compatibility, administration routes, and timing of the medications to be used The patient's access for treatment and lifetime cumulative dose history, if applicable  The patient's medication allergies and previous infusion related reactions, if applicable   Changes made to treatment plan:  N/A  Follow up needed:  Pending authorization for treatment    Ebony Hail, Pharm.D., CPP 07/14/2022@9 :26 AM

## 2022-07-15 NOTE — Progress Notes (Signed)
Radiation Oncology         [phone] ________________________________  Outpatient Re-Consultation  Name: Barbara Green MRN: [id number]  Date: 07/16/2022  DOB: 01-Jul-1940  CC:Barbara Skeans, MD  Barbara Person, MD   REFERRING PHYSICIAN: Omar Person, MD  DIAGNOSIS: The primary encounter diagnosis was Primary malignant neoplasm of left lower lobe of lung (HCC). A diagnosis of Stage III squamous cell carcinoma of left lung (HCC) was also pertinent to this visit.  Recent diagnosis of at least stage IIIA (T3, N1, Mx) non-small cell lung cancer, squamous cell carcinoma.  She presented with a left lower lobe mass and mildly enlarged left infrahilar lymph nodes   History of stage IA (pT1c, pN0, cM0) Right Breast UIQ, Invasive Ductal Carcinoma, ER+ / PR+ / Her2-, Grade 2: diagnosed in 2021, s/p lumpectomy and XRT  HISTORY OF PRESENT ILLNESS::Barbara Green is a 82 y.o. female who is seen as a courtesy of Dr. Thora Green for an opinion concerning radiation therapy as part of management for her recently diagnosed left lung cancer. She was last seen here on 01/02/22 for routine follow-up in the setting of her history of breast cancer.   The patient presented to the ED on 05/29/22 with dizziness. (She was also seen in the ED the day prior for symptomatic hyponatremia). Chest x-ray performed in the ED on 05/29/22 demonstrated a 6.3 x 4.4 cm mass in the lower left lung, highly suspicious for neoplasm.  -- A CT CAP was subsequently performed which also demonstrated the large, potentially centrally necrotic left lower lobe lung mass worrisome for neoplasm. CT also revealed a metastatic left hilar lymph node, a few prominent borderline mediastinal nodes, extensive chronic lung changes, and an area of focal nodularity at the GE junction of uncertain etiology/significance.  -- A super-D chest CT was also performed which redemonstrated the LLL mass measuring 4.3 x 4.5 cm, abutting the pleural margin, but  without definite chest wall invasion. CT also again showed the mildly enlarged left infrahilar lymph node.   Accordingly, the patient was referred to pulmonary medicine and she opted to proceed bronchoscopy on 06/12/22 under the care of Dr. Thora Green. Biopsies of the LLL mass collected revealed findings consistent with non-small cell carcinoma, consistent with squamous cell carcinoma. Biopsies of lymph nodes 7, 4L and 11L were also collected and showed no evidence of malignancy.   -- Molecular studies showed a PD-L1 expression of 2%   Her case was discussed at the multidisciplinary conference on 06/26/2022. Disposition recommended is to chemoradiation.   Subsequently, the patient was referred to Dr. Arbutus Green on 07/07/22. During this visit, the patient endorsed dyspnea on exertion and a dry cough at nighttime since undergoing bronchoscopy. She also reported noticing mild blood-tinged mucus after her bronchoscopy which had since improved. In terms of systemic treatment, Dr. Arbutus Green has recommended concurrent chemoradiation with carboplatin for an AUC of 2 and paclitaxel 45 mg/m given weekly x 6 with concurrent radiation. The patient is interested in this option and has been tentatively arranged to start her first cycle of treatment on 07/21/2022.   Pertinent imaging/staging work-up performed thus far has included:  -- PET scan on 07/08/22 redemonstrated the LLL primary bronchogenic carcinoma with ipsilateral infrahilar nodal metastasis. No distant soft tissue metastatic disease was appreciated. Of note: PET did however show a mild hypermetabolism and equivocal sclerosis involving the left side of the sternal manubrium. Early osseous metastatic disease can not be excluded pertaining to this finding.  -- An MRI of  the brain was performed on 07/07/22.  This study showed no evidence of metastatic disease to the brain.  Smoking/ETOH History: Smoked approximately 50 to 60 years averaging half a pack of cigarettes  per day (quite recently).  She reports she drinks wine occasionally.  Denies any drug use.   PREVIOUS RADIATION THERAPY: Yes   Radiation Treatment Dates: 06/29/2019 through 07/29/2019 Site Technique Total Dose (Gy) Dose per Fx (Gy) Completed Fx Beam Energies  Breast, Right: Breast_Rt 3D 40.05/40.05 2.67 15/15 6X  Breast, Right: Breast_Rt_Bst 3D 16/16 2 8/8 6X, 10X    PAST MEDICAL HISTORY:  Past Medical History:  Diagnosis Date   History of radiation therapy 06/29/19-07/29/19   Right breast IMRT- Dr. Roselind Green   Hypertension     PAST SURGICAL HISTORY: Past Surgical History:  Procedure Laterality Date   BREAST LUMPECTOMY WITH SENTINEL LYMPH NODE BIOPSY Right 05/17/2019   Procedure: RIGHT BREAST LUMPECTOMY WITH SENTINEL LYMPH NODE BIOPSY;  Surgeon: Barbara Miyamoto, MD;  Location: [facility];  Service: General;  Laterality: Right;   BRONCHIAL BIOPSY  06/12/2022   Procedure: BRONCHIAL BIOPSIES;  Surgeon: Barbara Person, MD;  Location: [facility] ENDOSCOPY;  Service: Pulmonary;;   BRONCHIAL NEEDLE ASPIRATION BIOPSY  06/12/2022   Procedure: BRONCHIAL NEEDLE ASPIRATION BIOPSIES;  Surgeon: Barbara Person, MD;  Location: [facility] ENDOSCOPY;  Service: Pulmonary;;   FIDUCIAL MARKER PLACEMENT  06/12/2022   Procedure: FIDUCIAL MARKER PLACEMENT;  Surgeon: Barbara Person, MD;  Location: [facility] ENDOSCOPY;  Service: Pulmonary;;   NO PAST SURGERIES     VIDEO BRONCHOSCOPY WITH ENDOBRONCHIAL ULTRASOUND N/A 06/12/2022   Procedure: VIDEO BRONCHOSCOPY WITH ENDOBRONCHIAL ULTRASOUND;  Surgeon: Barbara Person, MD;  Location: [facility] ENDOSCOPY;  Service: Pulmonary;  Laterality: N/A;    FAMILY HISTORY:  Family History  Problem Relation Age of Onset   Alzheimer's disease Mother    Heart disease Father    Alcohol abuse Father     SOCIAL HISTORY:  Social History   Tobacco Use   Smoking status: Some Days    Packs/day: 0.33    Years: 30.00    Additional pack years: 0.00    Total pack years: 9.90    Types:  Cigarettes    Last attempt to quit: 05/2022    Years since quitting: 0.1    Passive exposure: Current   Smokeless tobacco: Never  Vaping Use   Vaping Use: Never used  Substance Use Topics   Alcohol use: Yes    Comment: occasionally a glass wine   Drug use: Never    ALLERGIES: No Known Allergies  MEDICATIONS:  Current Outpatient Medications  Medication Sig Dispense Refill   benzonatate (TESSALON) 100 MG capsule Take 1 capsule (100 mg total) by mouth 3 (three) times daily as needed for cough. 30 capsule 2   BONINE 25 MG CHEW Chew 25 mg by mouth daily as needed (for vertigo).     demeclocycline (DECLOMYCIN) 150 MG tablet Take 1 tablet (150 mg total) by mouth 2 (two) times daily. 60 tablet 0   lisinopril (ZESTRIL) 5 MG tablet Take 1 tablet (5 mg total) by mouth daily. 90 tablet 1   nicotine (NICODERM CQ - DOSED IN MG/24 HOURS) 21 mg/24hr patch Place 1 patch (21 mg total) onto the skin daily. 28 patch 0   UNKNOWN TO PATIENT Take 1 tablet by mouth See admin instructions. Unnamed allergy tablet- Take 1 tablet by mouth once a day as needed for seasonal allergies     ALPRAZolam (XANAX) 0.5 MG  tablet Take 1 tablet (0.5 mg total) by mouth every 5 (five) minutes as needed for up to 3 doses for anxiety (for MRI). (Patient not taking: Reported on 07/16/2022) 3 tablet 0   prochlorperazine (COMPAZINE) 10 MG tablet Take 1 tablet (10 mg total) by mouth every 6 (six) hours as needed. (Patient not taking: Reported on 07/16/2022) 30 tablet 2   No current facility-administered medications for this encounter.    REVIEW OF SYSTEMS:  A 10+ POINT REVIEW OF SYSTEMS WAS OBTAINED including neurology, dermatology, psychiatry, cardiac, respiratory, lymph, extremities, GI, GU, musculoskeletal, constitutional, reproductive, HEENT.  She denies any pain within the chest area significant cough or hemoptysis.  She denies any significant changes in her breathing.  She denies use of supplemental oxygen.   PHYSICAL EXAM:   height is 5' (1.524 m) and weight is 100 lb 4 oz (45.5 kg). Her temporal temperature is 97.3 F (36.3 C) (abnormal). Her blood pressure is 133/76 and her pulse is 113 (abnormal). Her respiration is 18 and oxygen saturation is 100%.   General: Alert and oriented, in no acute distress HEENT: Head is normocephalic. Extraocular movements are intact.  Neck: Neck is supple, no palpable cervical or supraclavicular lymphadenopathy. Heart: Regular in rate and rhythm with no murmurs, rubs, or gallops. Chest: Clear to auscultation bilaterally, with no rhonchi, wheezes, or rales. Abdomen: Soft, nontender, nondistended, with no rigidity or guarding. Extremities: No cyanosis or edema. Lymphatics: see Neck Exam Skin: No concerning lesions. Musculoskeletal: symmetric strength and muscle tone throughout. Neurologic: Cranial nerves II through XII are grossly intact. No obvious focalities. Speech is fluent. Coordination is intact. Psychiatric: Judgment and insight are intact. Affect is appropriate. Examination of the left breast reveals no palpable mass nipple discharge or bleeding.  Examination of the right breast reveals mild/moderate lymphedema from previous radiation therapy.  Mild hyperpigmentation changes noted.  Lumpectomy scar noted in the medial aspect of the breast.  Extending over a portion of the sternum.  No palpable or visible signs of recurrence.  ECOG = 1  0 - Asymptomatic (Fully active, able to carry on all predisease activities without restriction)  1 - Symptomatic but completely ambulatory (Restricted in physically strenuous activity but ambulatory and able to carry out work of a light or sedentary nature. For example, light housework, office work)  2 - Symptomatic, <50% in bed during the day (Ambulatory and capable of all self care but unable to carry out any work activities. Up and about more than 50% of waking hours)  3 - Symptomatic, >50% in bed, but not bedbound (Capable of only limited  self-care, confined to bed or chair 50% or more of waking hours)  4 - Bedbound (Completely disabled. Cannot carry on any self-care. Totally confined to bed or chair)  5 - Death   Santiago Glad MM, Creech RH, Tormey DC, et al. [phone]). "Toxicity and response criteria of the [facility]". Am. Evlyn Clines. Oncol. 5 (6): 649-55  LABORATORY DATA:  Lab Results  Component Value Date   WBC 11.0 (H) 07/07/2022   HGB 14.1 07/07/2022   HCT 41.7 07/07/2022   MCV 86.9 07/07/2022   PLT 427 (H) 07/07/2022   NEUTROABS 6.9 07/07/2022   Lab Results  Component Value Date   NA 123 (L) 07/07/2022   K 4.2 07/07/2022   CL 88 (L) 07/07/2022   CO2 28 07/07/2022   GLUCOSE 105 (H) 07/07/2022   BUN 5 (L) 07/07/2022   CREATININE 0.38 (L) 07/07/2022   CALCIUM 9.7  07/07/2022      RADIOGRAPHY: MR BRAIN W WO CONTRAST  Result Date: 07/16/2022 CLINICAL DATA:  Provided history: Non-small cell cancer of left lung. Malignant neoplasm of unspecified part of unspecified bronchus or lung. Staging. EXAM: MRI HEAD WITHOUT AND WITH CONTRAST TECHNIQUE: Multiplanar, multiecho pulse sequences of the brain and surrounding structures were obtained without and with intravenous contrast. CONTRAST:  4.72mL GADAVIST GADOBUTROL 1 MMOL/ML IV SOLN COMPARISON:  PET-CT 07/08/2022. FINDINGS: Brain: Mild generalized cerebral atrophy. Multifocal T2 FLAIR hyperintense signal abnormality within the cerebral white matter, nonspecific but compatible with moderate to advanced chronic small vessel ischemic disease. Mild chronic small vessel ischemic changes within the pons. Small chronic infarcts within the left cerebellar hemisphere. There is no acute infarct. No evidence of an intracranial mass. No chronic intracranial blood products. No extra-axial fluid collection. No midline shift. No pathologic intracranial enhancement identified. Vascular: Maintained flow voids within the proximal large arterial vessels. Skull and upper cervical  spine: Nonspecific heterogeneous marrow signal within the calvarium and visualized cervical spine. Sinuses/Orbits: No mass or acute finding within the imaged orbits. No significant paranasal sinus disease. IMPRESSION: 1. No evidence of intracranial metastatic disease. 2. Chronic small-vessel ischemic changes which are moderate-to-advanced in the cerebral white matter and mild in the pons. 3. Small chronic infarcts within the left cerebellar hemisphere. 4. Nonspecific heterogeneous marrow signal within the calvarium and visualized cervical spine. Superimposed osseous metastatic disease (particularly within the cervical spine) is difficult to exclude. Consider a cervical spine MRI (with and without contrast for further evaluation. Electronically Signed   By: Jackey Loge D.O.   On: 07/16/2022 09:31   NM PET Image Initial (PI) Skull Base To Thigh (F-18 FDG)  Result Date: 07/14/2022 CLINICAL DATA:  Initial treatment strategy for non-small-cell lung cancer/staging. EXAM: NUCLEAR MEDICINE PET SKULL BASE TO THIGH TECHNIQUE: 5.0 mCi F-18 FDG was injected intravenously. Full-ring PET imaging was performed from the skull base to thigh after the radiotracer. CT data was obtained and used for attenuation correction and anatomic localization. Fasting blood glucose: 127 mg/dl COMPARISON:  30/86/5784 chest abdomen and pelvic CTs. FINDINGS: Mediastinal blood pool activity: SUV max 2.0 Liver activity: SUV max NA NECK: No areas of abnormal hypermetabolism. Incidental CT findings: No cervical adenopathy. CHEST: Peripherally hypermetabolic, centrally photopenic and necrotic left lower lobe lung mass on the order of 5.0 x 4.6 cm and a S.U.V. max of 17.5 on 68/4. More inferior and peripheral left lower lobe hypermetabolic airspace disease is favored to be due to postobstructive atelectasis on 79/4 versus less likely tumor extension. Hypermetabolism corresponding to the previously described left infrahilar node. 1.8 cm on the prior  diagnostic CT and a S.U.V. max of 15.3 on 61/4 today. Incidental CT findings: Subpleural anterior right upper lobe radiation fibrosis. Centrilobular emphysema. Aortic and coronary artery calcification. ABDOMEN/PELVIS: Multifocal colonic hypermetabolism is likely physiologic. No abdominopelvic nodal hypermetabolism. No adrenal hypermetabolism. Incidental CT findings: Normal adrenal glands. Interpolar left renal 2.9 cm low-density lesion is likely a cyst . In the absence of clinically indicated signs/symptoms require(s) no independent follow-up. Abdominal aortic atherosclerosis. Pelvic floor laxity. Scattered colonic diverticula. Tiny left groin hernia is favored to be femoral and contains fluid on 157/4. SKELETON: Mild hypermetabolism within the left side of the sternal manubrium with equivocal sclerosis including at a S.U.V. max of 2.3 on 48/4. Incidental CT findings: Osteopenia. Nonacute bilateral rib fractures. IMPRESSION: 1. Left lower lobe primary bronchogenic carcinoma with ipsilateral infrahilar nodal metastasis. 2. No distant soft tissue metastasis. 3. Mild hypermetabolism  and equivocal sclerosis involving the left side of the sternal manubrium. Cannot exclude early osseous metastasis. Recommend attention on follow-up. If clinical differentiation is desired, consider sampling. 4. Incidental findings, including: Aortic atherosclerosis (ICD10-I70.0), coronary artery atherosclerosis and emphysema (ICD10-J43.9). Electronically Signed   By: Jeronimo Greaves M.D.   On: 07/14/2022 12:48      IMPRESSION: Recent diagnosis of at least stage IIIA (T3, N1, Mx) non-small cell lung cancer, squamous cell carcinoma.  She presented with a left lower lobe mass and mildly enlarged left infrahilar lymph nodes   She would be a good candidate for a definitive course of radiation therapy along with radiosensitizing chemotherapy.  Today, I talked to the patient about the findings and work-up thus far.  We discussed the natural  history of non-small cell lung cancer and general treatment, highlighting the role of radiotherapy in the management.  We discussed the available radiation techniques, and focused on the details of logistics and delivery.  We reviewed the anticipated acute and late sequelae associated with radiation in this setting.  The patient was encouraged to ask questions that I answered to the best of my ability.  A patient consent form was discussed and signed.  We retained a copy for our records.  The patient would like to proceed with radiation and will be scheduled for CT simulation.  PLAN: She will return for CT simulation on June 21st with treatments to begin next week concomitant with her first cycle of radiosensitizing chemotherapy.  Anticipate 6 weeks of radiation therapy.   60 minutes of total time was spent for this patient encounter, including preparation, face-to-face counseling with the patient and coordination of care, physical exam, and documentation of the encounter.   ------------------------------------------------  Billie Lade, PhD, MD  This document serves as a record of services personally performed by Antony Blackbird, MD. It was created on his behalf by Neena Rhymes, a trained medical scribe. The creation of this record is based on the scribe's personal observations and the provider's statements to them. This document has been checked and approved by the attending provider.

## 2022-07-16 ENCOUNTER — Other Ambulatory Visit: Payer: Self-pay

## 2022-07-16 ENCOUNTER — Ambulatory Visit
Admission: RE | Admit: 2022-07-16 | Discharge: 2022-07-16 | Disposition: A | Discharge status: Home / Self Care | Payer: Medicare HMO | Source: Ambulatory Visit | Attending: Radiation Oncology | Admitting: Radiation Oncology

## 2022-07-16 ENCOUNTER — Telehealth: Payer: Self-pay

## 2022-07-16 ENCOUNTER — Telehealth: Payer: Self-pay | Admitting: Medical Oncology

## 2022-07-16 ENCOUNTER — Encounter: Payer: Self-pay | Admitting: Radiation Oncology

## 2022-07-16 VITALS — BP 133/76 | HR 113 | Temp 97.3°F | Resp 18 | Ht 60.0 in | Wt 100.2 lb

## 2022-07-16 DIAGNOSIS — J432 Centrilobular emphysema: Secondary | ICD-10-CM | POA: Insufficient documentation

## 2022-07-16 DIAGNOSIS — I2510 Atherosclerotic heart disease of native coronary artery without angina pectoris: Secondary | ICD-10-CM | POA: Diagnosis not present

## 2022-07-16 DIAGNOSIS — I10 Essential (primary) hypertension: Secondary | ICD-10-CM | POA: Insufficient documentation

## 2022-07-16 DIAGNOSIS — C3432 Malignant neoplasm of lower lobe, left bronchus or lung: Secondary | ICD-10-CM

## 2022-07-16 DIAGNOSIS — K5730 Diverticulosis of large intestine without perforation or abscess without bleeding: Secondary | ICD-10-CM | POA: Diagnosis not present

## 2022-07-16 DIAGNOSIS — F17210 Nicotine dependence, cigarettes, uncomplicated: Secondary | ICD-10-CM | POA: Diagnosis not present

## 2022-07-16 DIAGNOSIS — Z853 Personal history of malignant neoplasm of breast: Secondary | ICD-10-CM | POA: Insufficient documentation

## 2022-07-16 DIAGNOSIS — I6782 Cerebral ischemia: Secondary | ICD-10-CM | POA: Insufficient documentation

## 2022-07-16 DIAGNOSIS — C342 Malignant neoplasm of middle lobe, bronchus or lung: Secondary | ICD-10-CM

## 2022-07-16 DIAGNOSIS — Z79899 Other long term (current) drug therapy: Secondary | ICD-10-CM | POA: Insufficient documentation

## 2022-07-16 DIAGNOSIS — M8580 Other specified disorders of bone density and structure, unspecified site: Secondary | ICD-10-CM | POA: Diagnosis not present

## 2022-07-16 DIAGNOSIS — C3492 Malignant neoplasm of unspecified part of left bronchus or lung: Secondary | ICD-10-CM

## 2022-07-16 DIAGNOSIS — Z923 Personal history of irradiation: Secondary | ICD-10-CM | POA: Diagnosis not present

## 2022-07-16 DIAGNOSIS — I700 Atherosclerosis of aorta: Secondary | ICD-10-CM | POA: Diagnosis not present

## 2022-07-16 NOTE — Telephone Encounter (Signed)
This nurse reached out to patient.  Spoke with patients daughter , Darl Pikes, made aware that scans show no evidence of brain metastasis.  No other findings that are concerning at this time.  She acknowledged understanding.  No further questions or concerns noted.

## 2022-07-16 NOTE — Telephone Encounter (Signed)
Asking for results of MRI. I instructed her to contact Dr Thora Lance.

## 2022-07-18 ENCOUNTER — Ambulatory Visit
Admission: RE | Admit: 2022-07-18 | Discharge: 2022-07-18 | Disposition: A | Discharge status: Home / Self Care | Payer: Medicare HMO | Source: Ambulatory Visit | Attending: Radiation Oncology | Admitting: Radiation Oncology

## 2022-07-18 ENCOUNTER — Other Ambulatory Visit: Payer: Self-pay

## 2022-07-18 DIAGNOSIS — Z510 Encounter for antineoplastic radiation therapy: Secondary | ICD-10-CM | POA: Insufficient documentation

## 2022-07-18 DIAGNOSIS — C3432 Malignant neoplasm of lower lobe, left bronchus or lung: Secondary | ICD-10-CM | POA: Insufficient documentation

## 2022-07-18 DIAGNOSIS — C3492 Malignant neoplasm of unspecified part of left bronchus or lung: Secondary | ICD-10-CM

## 2022-07-18 MED FILL — Dexamethasone Sodium Phosphate Inj 100 MG/10ML: INTRAMUSCULAR | Qty: 1 | Status: AC

## 2022-07-19 ENCOUNTER — Other Ambulatory Visit: Payer: Self-pay

## 2022-07-21 ENCOUNTER — Inpatient Hospital Stay (HOSPITAL_BASED_OUTPATIENT_CLINIC_OR_DEPARTMENT_OTHER): Payer: Medicare HMO | Admitting: Internal Medicine

## 2022-07-21 ENCOUNTER — Encounter: Payer: Self-pay | Admitting: Internal Medicine

## 2022-07-21 ENCOUNTER — Inpatient Hospital Stay: Payer: Medicare HMO

## 2022-07-21 ENCOUNTER — Other Ambulatory Visit: Payer: Self-pay

## 2022-07-21 VITALS — BP 126/65 | HR 79 | Temp 97.9°F | Resp 18

## 2022-07-21 DIAGNOSIS — Z853 Personal history of malignant neoplasm of breast: Secondary | ICD-10-CM | POA: Diagnosis not present

## 2022-07-21 DIAGNOSIS — C3492 Malignant neoplasm of unspecified part of left bronchus or lung: Secondary | ICD-10-CM

## 2022-07-21 DIAGNOSIS — Z170 Estrogen receptor positive status [ER+]: Secondary | ICD-10-CM | POA: Diagnosis not present

## 2022-07-21 DIAGNOSIS — E871 Hypo-osmolality and hyponatremia: Secondary | ICD-10-CM | POA: Diagnosis not present

## 2022-07-21 DIAGNOSIS — C3432 Malignant neoplasm of lower lobe, left bronchus or lung: Secondary | ICD-10-CM | POA: Diagnosis not present

## 2022-07-21 DIAGNOSIS — Z923 Personal history of irradiation: Secondary | ICD-10-CM | POA: Diagnosis not present

## 2022-07-21 DIAGNOSIS — Z79899 Other long term (current) drug therapy: Secondary | ICD-10-CM | POA: Diagnosis not present

## 2022-07-21 DIAGNOSIS — R599 Enlarged lymph nodes, unspecified: Secondary | ICD-10-CM | POA: Diagnosis not present

## 2022-07-21 LAB — CBC WITH DIFFERENTIAL (CANCER CENTER ONLY)
Abs Immature Granulocytes: 0.02 10*3/uL (ref 0.00–0.07)
Basophils Absolute: 0.1 10*3/uL (ref 0.0–0.1)
Basophils Relative: 1 %
Eosinophils Absolute: 0.1 10*3/uL (ref 0.0–0.5)
Eosinophils Relative: 1 %
HCT: 39.9 % (ref 36.0–46.0)
Hemoglobin: 13.2 g/dL (ref 12.0–15.0)
Immature Granulocytes: 0 %
Lymphocytes Relative: 21 %
Lymphs Abs: 2 10*3/uL (ref 0.7–4.0)
MCH: 29.5 pg (ref 26.0–34.0)
MCHC: 33.1 g/dL (ref 30.0–36.0)
MCV: 89.1 fL (ref 80.0–100.0)
Monocytes Absolute: 1.3 10*3/uL — ABNORMAL HIGH (ref 0.1–1.0)
Monocytes Relative: 13 %
Neutro Abs: 6 10*3/uL (ref 1.7–7.7)
Neutrophils Relative %: 64 %
Platelet Count: 349 10*3/uL (ref 150–400)
RBC: 4.48 MIL/uL (ref 3.87–5.11)
RDW: 13.7 % (ref 11.5–15.5)
WBC Count: 9.5 10*3/uL (ref 4.0–10.5)
nRBC: 0 % (ref 0.0–0.2)

## 2022-07-21 LAB — CMP (CANCER CENTER ONLY)
ALT: 12 U/L (ref 0–44)
AST: 17 U/L (ref 15–41)
Albumin: 3.7 g/dL (ref 3.5–5.0)
Alkaline Phosphatase: 83 U/L (ref 38–126)
Anion gap: 8 (ref 5–15)
BUN: 8 mg/dL (ref 8–23)
CO2: 26 mmol/L (ref 22–32)
Calcium: 9.6 mg/dL (ref 8.9–10.3)
Chloride: 98 mmol/L (ref 98–111)
Creatinine: 0.4 mg/dL — ABNORMAL LOW (ref 0.44–1.00)
GFR, Estimated: >60 mL/min (ref >60)
Glucose, Bld: 102 mg/dL — ABNORMAL HIGH (ref 70–99)
Potassium: 3.6 mmol/L (ref 3.5–5.1)
Sodium: 132 mmol/L — ABNORMAL LOW (ref 135–145)
Total Bilirubin: 0.4 mg/dL (ref 0.3–1.2)
Total Protein: 6.4 g/dL — ABNORMAL LOW (ref 6.5–8.1)

## 2022-07-21 MED ORDER — SODIUM CHLORIDE 0.9 % IV SOLN
45.0000 mg/m2 | Freq: Once | INTRAVENOUS | Status: AC
  Administered 2022-07-21: 60 mg via INTRAVENOUS
  Filled 2022-07-21: qty 10

## 2022-07-21 MED ORDER — SODIUM CHLORIDE 0.9 % IV SOLN
10.0000 mg | Freq: Once | INTRAVENOUS | Status: AC
  Administered 2022-07-21: 10 mg via INTRAVENOUS
  Filled 2022-07-21: qty 10

## 2022-07-21 MED ORDER — SODIUM CHLORIDE 0.9 % IV SOLN
113.0000 mg | Freq: Once | INTRAVENOUS | Status: AC
  Administered 2022-07-21: 110 mg via INTRAVENOUS
  Filled 2022-07-21: qty 11

## 2022-07-21 MED ORDER — DIPHENHYDRAMINE HCL 50 MG/ML IJ SOLN
50.0000 mg | Freq: Once | INTRAMUSCULAR | Status: AC
  Administered 2022-07-21: 50 mg via INTRAVENOUS
  Filled 2022-07-21: qty 1

## 2022-07-21 MED ORDER — NICOTINE 21 MG/24HR TD PT24: 21.0000 mg | MEDICATED_PATCH | Freq: Every day | TRANSDERMAL | 0 refills | Status: DC

## 2022-07-21 MED ORDER — SODIUM CHLORIDE 0.9 % IV SOLN: Freq: Once | INTRAVENOUS | Status: AC

## 2022-07-21 MED ORDER — PALONOSETRON HCL INJECTION 0.25 MG/5ML
0.2500 mg | Freq: Once | INTRAVENOUS | Status: AC
  Administered 2022-07-21: 0.25 mg via INTRAVENOUS
  Filled 2022-07-21: qty 5

## 2022-07-21 MED ORDER — FAMOTIDINE IN NACL 20-0.9 MG/50ML-% IV SOLN
20.0000 mg | Freq: Once | INTRAVENOUS | Status: AC
  Administered 2022-07-21: 20 mg via INTRAVENOUS
  Filled 2022-07-21: qty 50

## 2022-07-21 NOTE — Progress Notes (Signed)
[facility] Telephone:([phone]) [phone]   Fax:([phone]) [phone]  OFFICE PROGRESS NOTE  [name], MD [address] [place] [address]  DIAGNOSIS: Stage IIb/IIIa (T2b, N1-2, M0) non-small cell lung cancer, squamous cell carcinoma presented with large left lower lobe lung mass in addition to left hilar and suspicious mediastinal lymphadenopathy diagnosed in May 2024  PRIOR THERAPY: None  CURRENT THERAPY: A course of concurrent chemoradiation with weekly carboplatin for AUC of 2 and paclitaxel 45 Mg/M2.  First dose July 21, 2022.  INTERVAL HISTORY: Barbara Green 82 y.o. female returns to the clinic today for follow-up visit accompanied by her daughter.  The patient is feeling fine today with no concerning complaints except for some anxiety about her treatment starting today.  She denied having any current chest pain, shortness of breath, cough or hemoptysis.  She has no nausea, vomiting, diarrhea or constipation.  She has no headache or visual changes.  She denied having any recent weight loss or night sweats.  She had a PET scan as well as MRI of the brain performed recently and she is here for evaluation and discussion of her imaging studies before starting the first dose of her treatment.  MEDICAL HISTORY: Past Medical History:  Diagnosis Date   History of radiation therapy 06/29/19-07/29/19   Right breast IMRT- Dr. Roselind Messier   Hypertension     ALLERGIES:  has No Known Allergies.  MEDICATIONS:  Current Outpatient Medications  Medication Sig Dispense Refill   ALPRAZolam (XANAX) 0.5 MG tablet Take 1 tablet (0.5 mg total) by mouth every 5 (five) minutes as needed for up to 3 doses for anxiety (for MRI). (Patient not taking: Reported on 07/16/2022) 3 tablet 0   benzonatate (TESSALON) 100 MG capsule Take 1 capsule (100 mg total) by mouth 3 (three) times daily as needed for cough. 30 capsule 2   BONINE 25 MG CHEW Chew 25 mg by mouth daily as needed (for  vertigo).     demeclocycline (DECLOMYCIN) 150 MG tablet Take 1 tablet (150 mg total) by mouth 2 (two) times daily. 60 tablet 0   lisinopril (ZESTRIL) 5 MG tablet Take 1 tablet (5 mg total) by mouth daily. 90 tablet 1   nicotine (NICODERM CQ - DOSED IN MG/24 HOURS) 21 mg/24hr patch Place 1 patch (21 mg total) onto the skin daily. 28 patch 0   prochlorperazine (COMPAZINE) 10 MG tablet Take 1 tablet (10 mg total) by mouth every 6 (six) hours as needed. (Patient not taking: Reported on 07/16/2022) 30 tablet 2   UNKNOWN TO PATIENT Take 1 tablet by mouth See admin instructions. Unnamed allergy tablet- Take 1 tablet by mouth once a day as needed for seasonal allergies     No current facility-administered medications for this visit.    SURGICAL HISTORY:  Past Surgical History:  Procedure Laterality Date   BREAST LUMPECTOMY WITH SENTINEL LYMPH NODE BIOPSY Right 05/17/2019   Procedure: RIGHT BREAST LUMPECTOMY WITH SENTINEL LYMPH NODE BIOPSY;  Surgeon: Abigail Miyamoto, MD;  Location: [facility];  Service: General;  Laterality: Right;   BRONCHIAL BIOPSY  06/12/2022   Procedure: BRONCHIAL BIOPSIES;  Surgeon: Omar Person, MD;  Location: [facility] ENDOSCOPY;  Service: Pulmonary;;   BRONCHIAL NEEDLE ASPIRATION BIOPSY  06/12/2022   Procedure: BRONCHIAL NEEDLE ASPIRATION BIOPSIES;  Surgeon: Omar Person, MD;  Location: [facility] ENDOSCOPY;  Service: Pulmonary;;   FIDUCIAL MARKER PLACEMENT  06/12/2022   Procedure: FIDUCIAL MARKER PLACEMENT;  Surgeon: Thora Lance,  Ivor Costa, MD;  Location[facility] ENDOSCOPY;  Service: Pulmonary;;   NO PAST SURGERIES     VIDEO BRONCHOSCOPY WITH ENDOBRONCHIAL ULTRASOUND N/A 06/12/2022   Procedure: VIDEO BRONCHOSCOPY WITH ENDOBRONCHIAL ULTRASOUND;  Surgeon: Omar Person, MD;  Location: [facility] ENDOSCOPY;  Service: Pulmonary;  Laterality: N/A;    REVIEW OF SYSTEMS:  Constitutional: positive for fatigue Eyes: negative Ears, nose, mouth, throat, and face: negative Respiratory:  positive for cough Cardiovascular: negative Gastrointestinal: negative Genitourinary:negative Integument/breast: negative Hematologic/lymphatic: negative Musculoskeletal:negative Neurological: negative Behavioral/Psych: negative Endocrine: negative Allergic/Immunologic: negative   PHYSICAL EXAMINATION: General appearance: alert, cooperative, fatigued, and no distress Head: Normocephalic, without obvious abnormality, atraumatic Neck: no adenopathy, no JVD, supple, symmetrical, trachea midline, and thyroid not enlarged, symmetric, no tenderness/mass/nodules Lymph nodes: Cervical, supraclavicular, and axillary nodes normal. Resp: clear to auscultation bilaterally Back: symmetric, no curvature. ROM normal. No CVA tenderness. Cardio: regular rate and rhythm, S1, S2 normal, no murmur, click, rub or gallop GI: soft, non-tender; bowel sounds normal; no masses,  no organomegaly Extremities: extremities normal, atraumatic, no cyanosis or edema Neurologic: Alert and oriented X 3, normal strength and tone. Normal symmetric reflexes. Normal coordination and gait  ECOG PERFORMANCE STATUS: 1 - Symptomatic but completely ambulatory  Blood pressure (!) 148/81, pulse 95, temperature (!) 97.4 F (36.3 C), temperature source Oral, resp. rate 18, height 5' (1.524 m), weight 100 lb 12.8 oz (45.7 kg), SpO2 96 %.  LABORATORY DATA: Lab Results  Component Value Date   WBC 9.5 07/21/2022   HGB 13.2 07/21/2022   HCT 39.9 07/21/2022   MCV 89.1 07/21/2022   PLT 349 07/21/2022      Chemistry      Component Value Date/Time   NA 123 (L) 07/07/2022 1317   NA 124 (L) 05/26/2022 1323   K 4.2 07/07/2022 1317   CL 88 (L) 07/07/2022 1317   CO2 28 07/07/2022 1317   BUN 5 (L) 07/07/2022 1317   BUN 7 (L) 05/26/2022 1323   CREATININE 0.38 (L) 07/07/2022 1317      Component Value Date/Time   CALCIUM 9.7 07/07/2022 1317   ALKPHOS 119 07/07/2022 1317   AST 15 07/07/2022 1317   ALT 10 07/07/2022 1317    BILITOT 0.8 07/07/2022 1317       RADIOGRAPHIC STUDIES: MR BRAIN W WO CONTRAST  Result Date: 07/16/2022 CLINICAL DATA:  Provided history: Non-small cell cancer of left lung. Malignant neoplasm of unspecified part of unspecified bronchus or lung. Staging. EXAM: MRI HEAD WITHOUT AND WITH CONTRAST TECHNIQUE: Multiplanar, multiecho pulse sequences of the brain and surrounding structures were obtained without and with intravenous contrast. CONTRAST:  4.42mL GADAVIST GADOBUTROL 1 MMOL/ML IV SOLN COMPARISON:  PET-CT 07/08/2022. FINDINGS: Brain: Mild generalized cerebral atrophy. Multifocal T2 FLAIR hyperintense signal abnormality within the cerebral white matter, nonspecific but compatible with moderate to advanced chronic small vessel ischemic disease. Mild chronic small vessel ischemic changes within the pons. Small chronic infarcts within the left cerebellar hemisphere. There is no acute infarct. No evidence of an intracranial mass. No chronic intracranial blood products. No extra-axial fluid collection. No midline shift. No pathologic intracranial enhancement identified. Vascular: Maintained flow voids within the proximal large arterial vessels. Skull and upper cervical spine: Nonspecific heterogeneous marrow signal within the calvarium and visualized cervical spine. Sinuses/Orbits: No mass or acute finding within the imaged orbits. No significant paranasal sinus disease. IMPRESSION: 1. No evidence of intracranial metastatic disease. 2. Chronic small-vessel ischemic changes which are moderate-to-advanced in the cerebral white matter and mild in the  pons. 3. Small chronic infarcts within the left cerebellar hemisphere. 4. Nonspecific heterogeneous marrow signal within the calvarium and visualized cervical spine. Superimposed osseous metastatic disease (particularly within the cervical spine) is difficult to exclude. Consider a cervical spine MRI (with and without contrast for further evaluation. Electronically  Signed   By: Jackey Loge D.O.   On: 07/16/2022 09:31   NM PET Image Initial (PI) Skull Base To Thigh (F-18 FDG)  Result Date: 07/14/2022 CLINICAL DATA:  Initial treatment strategy for non-small-cell lung cancer/staging. EXAM: NUCLEAR MEDICINE PET SKULL BASE TO THIGH TECHNIQUE: 5.0 mCi F-18 FDG was injected intravenously. Full-ring PET imaging was performed from the skull base to thigh after the radiotracer. CT data was obtained and used for attenuation correction and anatomic localization. Fasting blood glucose: 127 mg/dl COMPARISON:  16/10/9602 chest abdomen and pelvic CTs. FINDINGS: Mediastinal blood pool activity: SUV max 2.0 Liver activity: SUV max NA NECK: No areas of abnormal hypermetabolism. Incidental CT findings: No cervical adenopathy. CHEST: Peripherally hypermetabolic, centrally photopenic and necrotic left lower lobe lung mass on the order of 5.0 x 4.6 cm and a S.U.V. max of 17.5 on 68/4. More inferior and peripheral left lower lobe hypermetabolic airspace disease is favored to be due to postobstructive atelectasis on 79/4 versus less likely tumor extension. Hypermetabolism corresponding to the previously described left infrahilar node. 1.8 cm on the prior diagnostic CT and a S.U.V. max of 15.3 on 61/4 today. Incidental CT findings: Subpleural anterior right upper lobe radiation fibrosis. Centrilobular emphysema. Aortic and coronary artery calcification. ABDOMEN/PELVIS: Multifocal colonic hypermetabolism is likely physiologic. No abdominopelvic nodal hypermetabolism. No adrenal hypermetabolism. Incidental CT findings: Normal adrenal glands. Interpolar left renal 2.9 cm low-density lesion is likely a cyst . In the absence of clinically indicated signs/symptoms require(s) no independent follow-up. Abdominal aortic atherosclerosis. Pelvic floor laxity. Scattered colonic diverticula. Tiny left groin hernia is favored to be femoral and contains fluid on 157/4. SKELETON: Mild hypermetabolism within the  left side of the sternal manubrium with equivocal sclerosis including at a S.U.V. max of 2.3 on 48/4. Incidental CT findings: Osteopenia. Nonacute bilateral rib fractures. IMPRESSION: 1. Left lower lobe primary bronchogenic carcinoma with ipsilateral infrahilar nodal metastasis. 2. No distant soft tissue metastasis. 3. Mild hypermetabolism and equivocal sclerosis involving the left side of the sternal manubrium. Cannot exclude early osseous metastasis. Recommend attention on follow-up. If clinical differentiation is desired, consider sampling. 4. Incidental findings, including: Aortic atherosclerosis (ICD10-I70.0), coronary artery atherosclerosis and emphysema (ICD10-J43.9). Electronically Signed   By: Jeronimo Greaves M.D.   On: 07/14/2022 12:48    ASSESSMENT AND PLAN: This is a very pleasant 82 years old white female recently diagnosed with a stage IIb (T2b, N1, M0) non-small cell lung cancer, squamous cell carcinoma presented with left lower lobe lung mass in addition to ipsilateral infrahilar nodal metastasis diagnosed in June 2024. The patient had a PET scan and as well as MRI of the brain performed recently that showed no evidence of metastatic disease outside of the chest or brain.  I discussed the imaging studies with the patient and her daughter. She is not a good surgical candidate for resection. She is here today for her to start the first cycle of concurrent chemoradiation with weekly carboplatin for AUC of 2 and paclitaxel 45 Mg/M2.  I recommended for the patient to proceed with her treatment as planned. She will come back for follow-up visit in 2 weeks for evaluation before starting cycle #3. For the smoking cessation I will give her  a refill of NicoDerm. The patient was advised to call immediately if she has any other concerning symptoms in the interval. The patient voices understanding of current disease status and treatment options and is in agreement with the current care plan.  All  questions were answered. The patient knows to call the clinic with any problems, questions or concerns. We can certainly see the patient much sooner if necessary.  The total time spent in the appointment was 30 minutes.  Disclaimer: This note was dictated with voice recognition software. Similar sounding words can inadvertently be transcribed and may not be corrected upon review.

## 2022-07-21 NOTE — Progress Notes (Signed)
Received call from patient's daughter(Susan) after receiving my card per my request.  Introduced myself as Dance movement psychotherapist and to offer available resources. Discussed one-time $1000 [employer] grant to assist with personal expenses while going through treatment such as gas cards. Asked if this is something they would be interested in applying for. She states not at this time and her mom has good insurance and she drives from [place] to bring her to her appointments. Advised to contact me should anything change. She verbalized understanding.  They have my card for any additional financial questions or concerns.

## 2022-07-21 NOTE — Patient Instructions (Signed)
[facility]  Discharge Instructions: Thank you for choosing [facility] to provide your oncology and hematology care.   If you have a lab appointment with the Cancer Center, please go directly to the Cancer Center and check in at the registration area.   Wear comfortable clothing and clothing appropriate for easy access to any Portacath or PICC line.   We strive to give you quality time with your provider. You may need to reschedule your appointment if you arrive late (15 or more minutes).  Arriving late affects you and other patients whose appointments are after yours.  Also, if you miss three or more appointments without notifying the office, you may be dismissed from the clinic at the provider's discretion.      For prescription refill requests, have your pharmacy contact our office and allow 72 hours for refills to be completed.    Today you received the following chemotherapy and/or immunotherapy agents paclitaxel, carboplatin      To help prevent nausea and vomiting after your treatment, we encourage you to take your nausea medication as directed.  BELOW ARE SYMPTOMS THAT SHOULD BE REPORTED IMMEDIATELY: *FEVER GREATER THAN 100.4 F (38 C) OR HIGHER *CHILLS OR SWEATING *NAUSEA AND VOMITING THAT IS NOT CONTROLLED WITH YOUR NAUSEA MEDICATION *UNUSUAL SHORTNESS OF BREATH *UNUSUAL BRUISING OR BLEEDING *URINARY PROBLEMS (pain or burning when urinating, or frequent urination) *BOWEL PROBLEMS (unusual diarrhea, constipation, pain near the anus) TENDERNESS IN MOUTH AND THROAT WITH OR WITHOUT PRESENCE OF ULCERS (sore throat, sores in mouth, or a toothache) UNUSUAL RASH, SWELLING OR PAIN  UNUSUAL VAGINAL DISCHARGE OR ITCHING   Items with * indicate a potential emergency and should be followed up as soon as possible or go to the Emergency Department if any problems should occur.  Please show the CHEMOTHERAPY ALERT CARD or IMMUNOTHERAPY ALERT  CARD at check-in to the Emergency Department and triage nurse.  Should you have questions after your visit or need to cancel or reschedule your appointment, please contact [facility]  Dept: [phone]  and follow the prompts.  Office hours are 8:00 a.m. to 4:30 p.m. Monday - Friday. Please note that voicemails left after 4:00 p.m. may not be returned until the following business day.  We are closed weekends and major holidays. You have access to a nurse at all times for urgent questions. Please call the main number to the clinic Dept: [phone] and follow the prompts.   For any non-urgent questions, you may also contact your provider using MyChart. We now offer e-Visits for anyone 18 and older to request care online for non-urgent symptoms. For details visit [email or web address].   Also download the MyChart app! Go to the app store, search "MyChart", open the app, select [facility], and log in with your MyChart username and password. 

## 2022-07-21 NOTE — Progress Notes (Signed)
Nutrition Follow-up:   Patient with newly diagnosed stage III non -small cell lung cancer.  Followed by Dr Shirline Frees and Roselind Messier.  Starting chemotherapy and radiation.  Met with patient and daughter.  Reports that she is eating klondike bars for bedtime snack.  Has added cheese and crackers or peanut butter and crackers at lunch time and eats meat and couple sides for evening meal.  Is drinking ensure complete 1-2 times a day.  Boost Plus was too thick.  Asking when can she drink plain water.  Has been drinking blue gatorade but does not like it.    Medications: reviewed  Labs: Na 132  Anthropometrics:   Weight 100 lb 12.8 oz today increased 97 lb 12.8 oz on 6/10 UBW 110-115 lb    NUTRITION DIAGNOSIS: Food and nutrition related knowledge deficit improved    INTERVENTION:  Defer to MD regarding free water restriction Agreed that patient can try Liquid IV (contains Na and electrolytes) Continue high calorie, high protein diet    MONITORING, EVALUATION, GOAL: weight trends, intake   NEXT VISIT: Monday, July 15 during infusion  Bridget Tracyton B. Freida Busman, RD, LDN Registered Dietitian [phone]

## 2022-07-22 DIAGNOSIS — Z510 Encounter for antineoplastic radiation therapy: Secondary | ICD-10-CM | POA: Diagnosis not present

## 2022-07-24 ENCOUNTER — Ambulatory Visit
Admission: RE | Admit: 2022-07-24 | Discharge: 2022-07-24 | Disposition: A | Discharge status: Home / Self Care | Payer: Medicare HMO | Source: Ambulatory Visit | Attending: Radiation Oncology | Admitting: Radiation Oncology

## 2022-07-24 ENCOUNTER — Other Ambulatory Visit: Payer: Self-pay

## 2022-07-24 DIAGNOSIS — C3492 Malignant neoplasm of unspecified part of left bronchus or lung: Secondary | ICD-10-CM

## 2022-07-24 DIAGNOSIS — Z510 Encounter for antineoplastic radiation therapy: Secondary | ICD-10-CM | POA: Diagnosis not present

## 2022-07-24 LAB — RAD ONC ARIA SESSION SUMMARY
Course Elapsed Days: 0
Plan Fractions Treated to Date: 1
Plan Prescribed Dose Per Fraction: 2 Gy
Plan Total Fractions Prescribed: 30
Plan Total Prescribed Dose: 60 Gy
Reference Point Dosage Given to Date: 2 Gy
Reference Point Session Dosage Given: 2 Gy
Session Number: 1

## 2022-07-25 ENCOUNTER — Other Ambulatory Visit: Payer: Self-pay

## 2022-07-25 ENCOUNTER — Ambulatory Visit
Admission: RE | Admit: 2022-07-25 | Discharge: 2022-07-25 | Disposition: A | Discharge status: Home / Self Care | Payer: Medicare HMO | Source: Ambulatory Visit | Attending: Radiation Oncology | Admitting: Radiation Oncology

## 2022-07-25 ENCOUNTER — Encounter: Payer: Self-pay | Admitting: Internal Medicine

## 2022-07-25 DIAGNOSIS — Z510 Encounter for antineoplastic radiation therapy: Secondary | ICD-10-CM | POA: Diagnosis not present

## 2022-07-25 LAB — RAD ONC ARIA SESSION SUMMARY
Course Elapsed Days: 1
Plan Fractions Treated to Date: 2
Plan Prescribed Dose Per Fraction: 2 Gy
Plan Total Fractions Prescribed: 30
Plan Total Prescribed Dose: 60 Gy
Reference Point Dosage Given to Date: 4 Gy
Reference Point Session Dosage Given: 2 Gy
Session Number: 2

## 2022-07-25 MED FILL — Dexamethasone Sodium Phosphate Inj 100 MG/10ML: INTRAMUSCULAR | Qty: 1 | Status: AC

## 2022-07-25 NOTE — Telephone Encounter (Signed)
-----   Message from Edger House, RN sent at 07/21/2022  2:35 PM EDT ----- Regarding: FT Chemo Mohamed FT taxol/carbo, able to complete treatment with no issues.

## 2022-07-25 NOTE — Telephone Encounter (Signed)
Called & spoke with pt's daughter who states pt did great with chemo & cough went away & she felt good but then had some dizziness yest so is laying low now.  She states pt drives to radiation appts.  Daughter is in [place] & stayed with her after treatment & will be back for next treatment.  She reports pt is eating great & drinking fluids OK.  She knows how to reach [place] if needed & knows next appts.

## 2022-07-28 ENCOUNTER — Inpatient Hospital Stay (HOSPITAL_BASED_OUTPATIENT_CLINIC_OR_DEPARTMENT_OTHER): Payer: Medicare HMO

## 2022-07-28 ENCOUNTER — Other Ambulatory Visit: Payer: Self-pay

## 2022-07-28 ENCOUNTER — Ambulatory Visit
Admission: RE | Admit: 2022-07-28 | Discharge: 2022-07-28 | Disposition: A | Discharge status: Home / Self Care | Payer: Medicare HMO | Source: Ambulatory Visit | Attending: Radiation Oncology | Admitting: Radiation Oncology

## 2022-07-28 ENCOUNTER — Inpatient Hospital Stay: Payer: Medicare HMO | Attending: Physician Assistant

## 2022-07-28 ENCOUNTER — Encounter: Payer: Self-pay | Admitting: Internal Medicine

## 2022-07-28 VITALS — BP 120/74 | HR 98 | Temp 97.6°F | Wt 100.0 lb

## 2022-07-28 DIAGNOSIS — C3492 Malignant neoplasm of unspecified part of left bronchus or lung: Secondary | ICD-10-CM

## 2022-07-28 DIAGNOSIS — Z79899 Other long term (current) drug therapy: Secondary | ICD-10-CM | POA: Diagnosis not present

## 2022-07-28 DIAGNOSIS — Z170 Estrogen receptor positive status [ER+]: Secondary | ICD-10-CM | POA: Diagnosis present

## 2022-07-28 DIAGNOSIS — Z5111 Encounter for antineoplastic chemotherapy: Secondary | ICD-10-CM | POA: Diagnosis present

## 2022-07-28 DIAGNOSIS — C50211 Malignant neoplasm of upper-inner quadrant of right female breast: Secondary | ICD-10-CM | POA: Insufficient documentation

## 2022-07-28 DIAGNOSIS — C3432 Malignant neoplasm of lower lobe, left bronchus or lung: Secondary | ICD-10-CM | POA: Insufficient documentation

## 2022-07-28 DIAGNOSIS — R590 Localized enlarged lymph nodes: Secondary | ICD-10-CM | POA: Diagnosis not present

## 2022-07-28 DIAGNOSIS — E871 Hypo-osmolality and hyponatremia: Secondary | ICD-10-CM | POA: Insufficient documentation

## 2022-07-28 LAB — CMP (CANCER CENTER ONLY)
ALT: 15 U/L (ref 0–44)
AST: 18 U/L (ref 15–41)
Albumin: 3.6 g/dL (ref 3.5–5.0)
Alkaline Phosphatase: 83 U/L (ref 38–126)
Anion gap: 7 (ref 5–15)
BUN: 11 mg/dL (ref 8–23)
CO2: 26 mmol/L (ref 22–32)
Calcium: 8.9 mg/dL (ref 8.9–10.3)
Chloride: 95 mmol/L — ABNORMAL LOW (ref 98–111)
Creatinine: 0.39 mg/dL — ABNORMAL LOW (ref 0.44–1.00)
GFR, Estimated: >60 mL/min (ref >60)
Glucose, Bld: 94 mg/dL (ref 70–99)
Potassium: 3.8 mmol/L (ref 3.5–5.1)
Sodium: 128 mmol/L — ABNORMAL LOW (ref 135–145)
Total Bilirubin: 0.5 mg/dL (ref 0.3–1.2)
Total Protein: 6.8 g/dL (ref 6.5–8.1)

## 2022-07-28 LAB — RAD ONC ARIA SESSION SUMMARY
Course Elapsed Days: 4
Plan Fractions Treated to Date: 3
Plan Prescribed Dose Per Fraction: 2 Gy
Plan Total Fractions Prescribed: 30
Plan Total Prescribed Dose: 60 Gy
Reference Point Dosage Given to Date: 6 Gy
Reference Point Session Dosage Given: 2 Gy
Session Number: 3

## 2022-07-28 LAB — CBC WITH DIFFERENTIAL (CANCER CENTER ONLY)
Abs Immature Granulocytes: 0.06 10*3/uL (ref 0.00–0.07)
Basophils Absolute: 0.1 10*3/uL (ref 0.0–0.1)
Basophils Relative: 1 %
Eosinophils Absolute: 0.1 10*3/uL (ref 0.0–0.5)
Eosinophils Relative: 1 %
HCT: 37.8 % (ref 36.0–46.0)
Hemoglobin: 13 g/dL (ref 12.0–15.0)
Immature Granulocytes: 1 %
Lymphocytes Relative: 14 %
Lymphs Abs: 1.5 10*3/uL (ref 0.7–4.0)
MCH: 30.2 pg (ref 26.0–34.0)
MCHC: 34.4 g/dL (ref 30.0–36.0)
MCV: 87.9 fL (ref 80.0–100.0)
Monocytes Absolute: 1 10*3/uL (ref 0.1–1.0)
Monocytes Relative: 9 %
Neutro Abs: 7.7 10*3/uL (ref 1.7–7.7)
Neutrophils Relative %: 74 %
Platelet Count: 380 10*3/uL (ref 150–400)
RBC: 4.3 MIL/uL (ref 3.87–5.11)
RDW: 13.4 % (ref 11.5–15.5)
WBC Count: 10.4 10*3/uL (ref 4.0–10.5)
nRBC: 0 % (ref 0.0–0.2)

## 2022-07-28 MED ORDER — SODIUM CHLORIDE 0.9 % IV SOLN: Freq: Once | INTRAVENOUS | Status: AC

## 2022-07-28 MED ORDER — DIPHENHYDRAMINE HCL 50 MG/ML IJ SOLN
50.0000 mg | Freq: Once | INTRAMUSCULAR | Status: AC
  Administered 2022-07-28: 50 mg via INTRAVENOUS
  Filled 2022-07-28: qty 1

## 2022-07-28 MED ORDER — SODIUM CHLORIDE 0.9 % IV SOLN
113.0000 mg | Freq: Once | INTRAVENOUS | Status: AC
  Administered 2022-07-28: 110 mg via INTRAVENOUS
  Filled 2022-07-28: qty 11

## 2022-07-28 MED ORDER — FAMOTIDINE IN NACL 20-0.9 MG/50ML-% IV SOLN
20.0000 mg | Freq: Once | INTRAVENOUS | Status: AC
  Administered 2022-07-28: 20 mg via INTRAVENOUS
  Filled 2022-07-28: qty 50

## 2022-07-28 MED ORDER — PALONOSETRON HCL INJECTION 0.25 MG/5ML
0.2500 mg | Freq: Once | INTRAVENOUS | Status: AC
  Administered 2022-07-28: 0.25 mg via INTRAVENOUS
  Filled 2022-07-28: qty 5

## 2022-07-28 MED ORDER — SODIUM CHLORIDE 0.9 % IV SOLN
10.0000 mg | Freq: Once | INTRAVENOUS | Status: AC
  Administered 2022-07-28: 10 mg via INTRAVENOUS
  Filled 2022-07-28: qty 10

## 2022-07-28 MED ORDER — SODIUM CHLORIDE 0.9 % IV SOLN
45.0000 mg/m2 | Freq: Once | INTRAVENOUS | Status: AC
  Administered 2022-07-28: 60 mg via INTRAVENOUS
  Filled 2022-07-28: qty 10

## 2022-07-28 NOTE — Patient Instructions (Signed)
[facility]  Discharge Instructions: Thank you for choosing [facility] to provide your oncology and hematology care.   If you have a lab appointment with the Cancer Center, please go directly to the Cancer Center and check in at the registration area.   Wear comfortable clothing and clothing appropriate for easy access to any Portacath or PICC line.   We strive to give you quality time with your provider. You may need to reschedule your appointment if you arrive late (15 or more minutes).  Arriving late affects you and other patients whose appointments are after yours.  Also, if you miss three or more appointments without notifying the office, you may be dismissed from the clinic at the provider's discretion.      For prescription refill requests, have your pharmacy contact our office and allow 72 hours for refills to be completed.    Today you received the following chemotherapy and/or immunotherapy agents: Paclitaxel, Carboplatin.       To help prevent nausea and vomiting after your treatment, we encourage you to take your nausea medication as directed.  BELOW ARE SYMPTOMS THAT SHOULD BE REPORTED IMMEDIATELY: *FEVER GREATER THAN 100.4 F (38 C) OR HIGHER *CHILLS OR SWEATING *NAUSEA AND VOMITING THAT IS NOT CONTROLLED WITH YOUR NAUSEA MEDICATION *UNUSUAL SHORTNESS OF BREATH *UNUSUAL BRUISING OR BLEEDING *URINARY PROBLEMS (pain or burning when urinating, or frequent urination) *BOWEL PROBLEMS (unusual diarrhea, constipation, pain near the anus) TENDERNESS IN MOUTH AND THROAT WITH OR WITHOUT PRESENCE OF ULCERS (sore throat, sores in mouth, or a toothache) UNUSUAL RASH, SWELLING OR PAIN  UNUSUAL VAGINAL DISCHARGE OR ITCHING   Items with * indicate a potential emergency and should be followed up as soon as possible or go to the Emergency Department if any problems should occur.  Please show the CHEMOTHERAPY ALERT CARD or IMMUNOTHERAPY  ALERT CARD at check-in to the Emergency Department and triage nurse.  Should you have questions after your visit or need to cancel or reschedule your appointment, please contact [facility]  Dept: [phone]  and follow the prompts.  Office hours are 8:00 a.m. to 4:30 p.m. Monday - Friday. Please note that voicemails left after 4:00 p.m. may not be returned until the following business day.  We are closed weekends and major holidays. You have access to a nurse at all times for urgent questions. Please call the main number to the clinic Dept: [phone] and follow the prompts.   For any non-urgent questions, you may also contact your provider using MyChart. We now offer e-Visits for anyone 18 and older to request care online for non-urgent symptoms. For details visit [email or web address].   Also download the MyChart app! Go to the app store, search "MyChart", open the app, select [facility], and log in with your MyChart username and password.   

## 2022-07-29 ENCOUNTER — Ambulatory Visit
Admission: RE | Admit: 2022-07-29 | Discharge: 2022-07-29 | Disposition: A | Discharge status: Home / Self Care | Payer: Medicare HMO | Source: Ambulatory Visit | Attending: Radiation Oncology | Admitting: Radiation Oncology

## 2022-07-29 ENCOUNTER — Other Ambulatory Visit: Payer: Self-pay

## 2022-07-29 DIAGNOSIS — C50211 Malignant neoplasm of upper-inner quadrant of right female breast: Secondary | ICD-10-CM | POA: Diagnosis not present

## 2022-07-29 LAB — RAD ONC ARIA SESSION SUMMARY
Course Elapsed Days: 5
Plan Fractions Treated to Date: 4
Plan Prescribed Dose Per Fraction: 2 Gy
Plan Total Fractions Prescribed: 30
Plan Total Prescribed Dose: 60 Gy
Reference Point Dosage Given to Date: 8 Gy
Reference Point Session Dosage Given: 2 Gy
Session Number: 4

## 2022-07-29 MED ORDER — SONAFINE EX EMUL
1.0000 | Freq: Once | CUTANEOUS | Status: AC
  Administered 2022-07-29: 1 via TOPICAL

## 2022-07-30 ENCOUNTER — Other Ambulatory Visit: Payer: Self-pay

## 2022-07-30 ENCOUNTER — Ambulatory Visit
Admission: RE | Admit: 2022-07-30 | Discharge: 2022-07-30 | Disposition: A | Discharge status: Home / Self Care | Payer: Medicare HMO | Source: Ambulatory Visit | Attending: Radiation Oncology | Admitting: Radiation Oncology

## 2022-07-30 DIAGNOSIS — C50211 Malignant neoplasm of upper-inner quadrant of right female breast: Secondary | ICD-10-CM | POA: Diagnosis not present

## 2022-07-30 LAB — RAD ONC ARIA SESSION SUMMARY
Course Elapsed Days: 6
Plan Fractions Treated to Date: 5
Plan Prescribed Dose Per Fraction: 2 Gy
Plan Total Fractions Prescribed: 30
Plan Total Prescribed Dose: 60 Gy
Reference Point Dosage Given to Date: 10 Gy
Reference Point Session Dosage Given: 2 Gy
Session Number: 5

## 2022-08-01 ENCOUNTER — Ambulatory Visit
Admission: RE | Admit: 2022-08-01 | Discharge: 2022-08-01 | Disposition: A | Discharge status: Home / Self Care | Payer: Medicare HMO | Source: Ambulatory Visit | Attending: Radiation Oncology | Admitting: Radiation Oncology

## 2022-08-01 ENCOUNTER — Other Ambulatory Visit: Payer: Self-pay

## 2022-08-01 DIAGNOSIS — C50211 Malignant neoplasm of upper-inner quadrant of right female breast: Secondary | ICD-10-CM | POA: Diagnosis not present

## 2022-08-01 LAB — RAD ONC ARIA SESSION SUMMARY
Course Elapsed Days: 8
Plan Fractions Treated to Date: 6
Plan Prescribed Dose Per Fraction: 2 Gy
Plan Total Fractions Prescribed: 30
Plan Total Prescribed Dose: 60 Gy
Reference Point Dosage Given to Date: 12 Gy
Reference Point Session Dosage Given: 2 Gy
Session Number: 6

## 2022-08-01 MED FILL — Dexamethasone Sodium Phosphate Inj 100 MG/10ML: INTRAMUSCULAR | Qty: 1 | Status: AC

## 2022-08-04 ENCOUNTER — Inpatient Hospital Stay: Payer: Medicare HMO

## 2022-08-04 ENCOUNTER — Other Ambulatory Visit: Payer: Self-pay

## 2022-08-04 ENCOUNTER — Ambulatory Visit
Admission: RE | Admit: 2022-08-04 | Discharge: 2022-08-04 | Disposition: A | Discharge status: Home / Self Care | Payer: Medicare HMO | Source: Ambulatory Visit | Attending: Radiation Oncology | Admitting: Radiation Oncology

## 2022-08-04 ENCOUNTER — Inpatient Hospital Stay (HOSPITAL_BASED_OUTPATIENT_CLINIC_OR_DEPARTMENT_OTHER): Payer: Medicare HMO | Admitting: Internal Medicine

## 2022-08-04 VITALS — BP 124/75 | HR 90 | Resp 17

## 2022-08-04 DIAGNOSIS — C3492 Malignant neoplasm of unspecified part of left bronchus or lung: Secondary | ICD-10-CM

## 2022-08-04 DIAGNOSIS — C50211 Malignant neoplasm of upper-inner quadrant of right female breast: Secondary | ICD-10-CM | POA: Diagnosis not present

## 2022-08-04 DIAGNOSIS — E871 Hypo-osmolality and hyponatremia: Secondary | ICD-10-CM | POA: Diagnosis not present

## 2022-08-04 DIAGNOSIS — Z5111 Encounter for antineoplastic chemotherapy: Secondary | ICD-10-CM | POA: Diagnosis not present

## 2022-08-04 LAB — CMP (CANCER CENTER ONLY)
ALT: 14 U/L (ref 0–44)
AST: 17 U/L (ref 15–41)
Albumin: 3.4 g/dL — ABNORMAL LOW (ref 3.5–5.0)
Alkaline Phosphatase: 80 U/L (ref 38–126)
Anion gap: 8 (ref 5–15)
BUN: 10 mg/dL (ref 8–23)
CO2: 26 mmol/L (ref 22–32)
Calcium: 9.2 mg/dL (ref 8.9–10.3)
Chloride: 96 mmol/L — ABNORMAL LOW (ref 98–111)
Creatinine: 0.39 mg/dL — ABNORMAL LOW (ref 0.44–1.00)
GFR, Estimated: >60 mL/min (ref >60)
Glucose, Bld: 82 mg/dL (ref 70–99)
Potassium: 4 mmol/L (ref 3.5–5.1)
Sodium: 130 mmol/L — ABNORMAL LOW (ref 135–145)
Total Bilirubin: 0.5 mg/dL (ref 0.3–1.2)
Total Protein: 6.6 g/dL (ref 6.5–8.1)

## 2022-08-04 LAB — CBC WITH DIFFERENTIAL (CANCER CENTER ONLY)
Abs Immature Granulocytes: 0.04 10*3/uL (ref 0.00–0.07)
Basophils Absolute: 0 10*3/uL (ref 0.0–0.1)
Basophils Relative: 1 %
Eosinophils Absolute: 0 10*3/uL (ref 0.0–0.5)
Eosinophils Relative: 1 %
HCT: 36.4 % (ref 36.0–46.0)
Hemoglobin: 12.3 g/dL (ref 12.0–15.0)
Immature Granulocytes: 1 %
Lymphocytes Relative: 14 %
Lymphs Abs: 1.1 10*3/uL (ref 0.7–4.0)
MCH: 29.4 pg (ref 26.0–34.0)
MCHC: 33.8 g/dL (ref 30.0–36.0)
MCV: 87.1 fL (ref 80.0–100.0)
Monocytes Absolute: 1.1 10*3/uL — ABNORMAL HIGH (ref 0.1–1.0)
Monocytes Relative: 13 %
Neutro Abs: 5.7 10*3/uL (ref 1.7–7.7)
Neutrophils Relative %: 70 %
Platelet Count: 372 10*3/uL (ref 150–400)
RBC: 4.18 MIL/uL (ref 3.87–5.11)
RDW: 13.2 % (ref 11.5–15.5)
WBC Count: 8 10*3/uL (ref 4.0–10.5)
nRBC: 0 % (ref 0.0–0.2)

## 2022-08-04 LAB — RAD ONC ARIA SESSION SUMMARY
Course Elapsed Days: 11
Plan Fractions Treated to Date: 7
Plan Prescribed Dose Per Fraction: 2 Gy
Plan Total Fractions Prescribed: 30
Plan Total Prescribed Dose: 60 Gy
Reference Point Dosage Given to Date: 14 Gy
Reference Point Session Dosage Given: 2 Gy
Session Number: 7

## 2022-08-04 MED ORDER — SODIUM CHLORIDE 0.9 % IV SOLN
10.0000 mg | Freq: Once | INTRAVENOUS | Status: AC
  Administered 2022-08-04: 10 mg via INTRAVENOUS
  Filled 2022-08-04: qty 10

## 2022-08-04 MED ORDER — PALONOSETRON HCL INJECTION 0.25 MG/5ML
0.2500 mg | Freq: Once | INTRAVENOUS | Status: AC
  Administered 2022-08-04: 0.25 mg via INTRAVENOUS
  Filled 2022-08-04: qty 5

## 2022-08-04 MED ORDER — SODIUM CHLORIDE 0.9 % IV SOLN
113.0000 mg | Freq: Once | INTRAVENOUS | Status: AC
  Administered 2022-08-04: 110 mg via INTRAVENOUS
  Filled 2022-08-04: qty 11

## 2022-08-04 MED ORDER — SODIUM CHLORIDE 0.9 % IV SOLN
45.0000 mg/m2 | Freq: Once | INTRAVENOUS | Status: AC
  Administered 2022-08-04: 60 mg via INTRAVENOUS
  Filled 2022-08-04: qty 10

## 2022-08-04 MED ORDER — FAMOTIDINE IN NACL 20-0.9 MG/50ML-% IV SOLN
20.0000 mg | Freq: Once | INTRAVENOUS | Status: AC
  Administered 2022-08-04: 20 mg via INTRAVENOUS
  Filled 2022-08-04: qty 50

## 2022-08-04 MED ORDER — SODIUM CHLORIDE 0.9 % IV SOLN: Freq: Once | INTRAVENOUS | Status: AC

## 2022-08-04 MED ORDER — DEMECLOCYCLINE HCL 150 MG PO TABS
150.0000 mg | ORAL_TABLET | Freq: Two times a day (BID) | ORAL | 0 refills | Status: DC
Start: 2022-08-04 — End: 2022-09-08

## 2022-08-04 MED ORDER — DIPHENHYDRAMINE HCL 50 MG/ML IJ SOLN
50.0000 mg | Freq: Once | INTRAMUSCULAR | Status: AC
  Administered 2022-08-04: 50 mg via INTRAVENOUS
  Filled 2022-08-04: qty 1

## 2022-08-04 NOTE — Progress Notes (Signed)
[facility] [phone]   [phone]  OFFICE PROGRESS NOTE  [name], MD [address] [place] [address]  DIAGNOSIS: Stage IIb/IIIa (T2b, N1-2, M0) non-small cell lung cancer, squamous cell carcinoma presented with large left lower lobe lung mass in addition to left hilar and suspicious mediastinal lymphadenopathy diagnosed in May 2024  PRIOR THERAPY: None  CURRENT THERAPY: A course of concurrent chemoradiation with weekly carboplatin for AUC of 2 and paclitaxel 45 Mg/M2.  First dose July 21, 2022.  Status post 2 cycles.  INTERVAL HISTORY: Barbara Green 82 y.o. female returns to the clinic today for follow-up visit accompanied by her daughter.  The patient is feeling fine today with no concerning complaints except for fatigue.  She denied having any current chest pain, shortness of breath except with exertion with no cough or hemoptysis.  She has no nausea, vomiting, diarrhea or constipation.  She has no headache or visual changes.  She has no recent weight loss or night sweats.  She continues to tolerate her course of concurrent chemoradiation fairly well.   MEDICAL HISTORY: Past Medical History:  Diagnosis Date   History of radiation therapy 06/29/19-07/29/19   Right breast IMRT- Dr. Roselind Messier   Hypertension     ALLERGIES:  has No Known Allergies.  MEDICATIONS:  Current Outpatient Medications  Medication Sig Dispense Refill   ALPRAZolam (XANAX) 0.5 MG tablet Take 1 tablet (0.5 mg total) by mouth every 5 (five) minutes as needed for up to 3 doses for anxiety (for MRI). (Patient not taking: Reported on 07/16/2022) 3 tablet 0   benzonatate (TESSALON) 100 MG capsule Take 1 capsule (100 mg total) by mouth 3 (three) times daily as needed for cough. 30 capsule 2   BONINE 25 MG CHEW Chew 25 mg by mouth daily as needed (for vertigo).     demeclocycline (DECLOMYCIN) 150 MG tablet Take 1 tablet (150 mg total) by mouth 2 (two) times  daily. 60 tablet 0   lisinopril (ZESTRIL) 5 MG tablet Take 1 tablet (5 mg total) by mouth daily. 90 tablet 1   nicotine (NICODERM CQ - DOSED IN MG/24 HOURS) 21 mg/24hr patch Place 1 patch (21 mg total) onto the skin daily. 28 patch 0   prochlorperazine (COMPAZINE) 10 MG tablet Take 1 tablet (10 mg total) by mouth every 6 (six) hours as needed. (Patient not taking: Reported on 07/16/2022) 30 tablet 2   UNKNOWN TO PATIENT Take 1 tablet by mouth See admin instructions. Unnamed allergy tablet- Take 1 tablet by mouth once a day as needed for seasonal allergies     No current facility-administered medications for this visit.    SURGICAL HISTORY:  Past Surgical History:  Procedure Laterality Date   BREAST LUMPECTOMY WITH SENTINEL LYMPH NODE BIOPSY Right 05/17/2019   Procedure: RIGHT BREAST LUMPECTOMY WITH SENTINEL LYMPH NODE BIOPSY;  Surgeon: Abigail Miyamoto, MD;  Location: [facility];  Service: General;  Laterality: Right;   BRONCHIAL BIOPSY  06/12/2022   Procedure: BRONCHIAL BIOPSIES;  Surgeon: Omar Person, MD;  Location: [facility] ENDOSCOPY;  Service: Pulmonary;;   BRONCHIAL NEEDLE ASPIRATION BIOPSY  06/12/2022   Procedure: BRONCHIAL NEEDLE ASPIRATION BIOPSIES;  Surgeon: Omar Person, MD;  Location: [facility] ENDOSCOPY;  Service: Pulmonary;;   FIDUCIAL MARKER PLACEMENT  06/12/2022   Procedure: FIDUCIAL MARKER PLACEMENT;  Surgeon: Omar Person, MD;  Location: [facility] ENDOSCOPY;  Service: Pulmonary;;   NO PAST SURGERIES  VIDEO BRONCHOSCOPY WITH ENDOBRONCHIAL ULTRASOUND N/A 06/12/2022   Procedure: VIDEO BRONCHOSCOPY WITH ENDOBRONCHIAL ULTRASOUND;  Surgeon: Omar Person, MD;  Location: [facility] ENDOSCOPY;  Service: Pulmonary;  Laterality: N/A;    REVIEW OF SYSTEMS:  A comprehensive review of systems was negative except for: Constitutional: positive for fatigue   PHYSICAL EXAMINATION: General appearance: alert, cooperative, fatigued, and no distress Head: Normocephalic, without  obvious abnormality, atraumatic Neck: no adenopathy, no JVD, supple, symmetrical, trachea midline, and thyroid not enlarged, symmetric, no tenderness/mass/nodules Lymph nodes: Cervical, supraclavicular, and axillary nodes normal. Resp: clear to auscultation bilaterally Back: symmetric, no curvature. ROM normal. No CVA tenderness. Cardio: regular rate and rhythm, S1, S2 normal, no murmur, click, rub or gallop GI: soft, non-tender; bowel sounds normal; no masses,  no organomegaly Extremities: extremities normal, atraumatic, no cyanosis or edema  ECOG PERFORMANCE STATUS: 1 - Symptomatic but completely ambulatory  Blood pressure 138/72, pulse (!) 106, temperature (!) 97.4 F (36.3 C), temperature source Oral, resp. rate 16, height 5' (1.524 m), weight 99 lb 12.8 oz (45.3 kg), SpO2 96 %.  LABORATORY DATA: Lab Results  Component Value Date   WBC 10.4 07/28/2022   HGB 13.0 07/28/2022   HCT 37.8 07/28/2022   MCV 87.9 07/28/2022   PLT 380 07/28/2022      Chemistry      Component Value Date/Time   NA 128 (L) 07/28/2022 1235   NA 124 (L) 05/26/2022 1323   K 3.8 07/28/2022 1235   CL 95 (L) 07/28/2022 1235   CO2 26 07/28/2022 1235   BUN 11 07/28/2022 1235   BUN 7 (L) 05/26/2022 1323   CREATININE 0.39 (L) 07/28/2022 1235      Component Value Date/Time   CALCIUM 8.9 07/28/2022 1235   ALKPHOS 83 07/28/2022 1235   AST 18 07/28/2022 1235   ALT 15 07/28/2022 1235   BILITOT 0.5 07/28/2022 1235       RADIOGRAPHIC STUDIES: MR BRAIN W WO CONTRAST  Result Date: 07/16/2022 CLINICAL DATA:  Provided history: Non-small cell cancer of left lung. Malignant neoplasm of unspecified part of unspecified bronchus or lung. Staging. EXAM: MRI HEAD WITHOUT AND WITH CONTRAST TECHNIQUE: Multiplanar, multiecho pulse sequences of the brain and surrounding structures were obtained without and with intravenous contrast. CONTRAST:  4.25mL GADAVIST GADOBUTROL 1 MMOL/ML IV SOLN COMPARISON:  PET-CT 07/08/2022.  FINDINGS: Brain: Mild generalized cerebral atrophy. Multifocal T2 FLAIR hyperintense signal abnormality within the cerebral white matter, nonspecific but compatible with moderate to advanced chronic small vessel ischemic disease. Mild chronic small vessel ischemic changes within the pons. Small chronic infarcts within the left cerebellar hemisphere. There is no acute infarct. No evidence of an intracranial mass. No chronic intracranial blood products. No extra-axial fluid collection. No midline shift. No pathologic intracranial enhancement identified. Vascular: Maintained flow voids within the proximal large arterial vessels. Skull and upper cervical spine: Nonspecific heterogeneous marrow signal within the calvarium and visualized cervical spine. Sinuses/Orbits: No mass or acute finding within the imaged orbits. No significant paranasal sinus disease. IMPRESSION: 1. No evidence of intracranial metastatic disease. 2. Chronic small-vessel ischemic changes which are moderate-to-advanced in the cerebral white matter and mild in the pons. 3. Small chronic infarcts within the left cerebellar hemisphere. 4. Nonspecific heterogeneous marrow signal within the calvarium and visualized cervical spine. Superimposed osseous metastatic disease (particularly within the cervical spine) is difficult to exclude. Consider a cervical spine MRI (with and without contrast for further evaluation. Electronically Signed   By: Jackey Loge D.O.   On:  07/16/2022 09:31   NM PET Image Initial (PI) Skull Base To Thigh (F-18 FDG)  Result Date: 07/14/2022 CLINICAL DATA:  Initial treatment strategy for non-small-cell lung cancer/staging. EXAM: NUCLEAR MEDICINE PET SKULL BASE TO THIGH TECHNIQUE: 5.0 mCi F-18 FDG was injected intravenously. Full-ring PET imaging was performed from the skull base to thigh after the radiotracer. CT data was obtained and used for attenuation correction and anatomic localization. Fasting blood glucose: 127 mg/dl  COMPARISON:  16/10/9602 chest abdomen and pelvic CTs. FINDINGS: Mediastinal blood pool activity: SUV max 2.0 Liver activity: SUV max NA NECK: No areas of abnormal hypermetabolism. Incidental CT findings: No cervical adenopathy. CHEST: Peripherally hypermetabolic, centrally photopenic and necrotic left lower lobe lung mass on the order of 5.0 x 4.6 cm and a S.U.V. max of 17.5 on 68/4. More inferior and peripheral left lower lobe hypermetabolic airspace disease is favored to be due to postobstructive atelectasis on 79/4 versus less likely tumor extension. Hypermetabolism corresponding to the previously described left infrahilar node. 1.8 cm on the prior diagnostic CT and a S.U.V. max of 15.3 on 61/4 today. Incidental CT findings: Subpleural anterior right upper lobe radiation fibrosis. Centrilobular emphysema. Aortic and coronary artery calcification. ABDOMEN/PELVIS: Multifocal colonic hypermetabolism is likely physiologic. No abdominopelvic nodal hypermetabolism. No adrenal hypermetabolism. Incidental CT findings: Normal adrenal glands. Interpolar left renal 2.9 cm low-density lesion is likely a cyst . In the absence of clinically indicated signs/symptoms require(s) no independent follow-up. Abdominal aortic atherosclerosis. Pelvic floor laxity. Scattered colonic diverticula. Tiny left groin hernia is favored to be femoral and contains fluid on 157/4. SKELETON: Mild hypermetabolism within the left side of the sternal manubrium with equivocal sclerosis including at a S.U.V. max of 2.3 on 48/4. Incidental CT findings: Osteopenia. Nonacute bilateral rib fractures. IMPRESSION: 1. Left lower lobe primary bronchogenic carcinoma with ipsilateral infrahilar nodal metastasis. 2. No distant soft tissue metastasis. 3. Mild hypermetabolism and equivocal sclerosis involving the left side of the sternal manubrium. Cannot exclude early osseous metastasis. Recommend attention on follow-up. If clinical differentiation is desired,  consider sampling. 4. Incidental findings, including: Aortic atherosclerosis (ICD10-I70.0), coronary artery atherosclerosis and emphysema (ICD10-J43.9). Electronically Signed   By: Jeronimo Greaves M.D.   On: 07/14/2022 12:48    ASSESSMENT AND PLAN: This is a very pleasant 82 years old white female recently diagnosed with a stage IIb (T2b, N1, M0) non-small cell lung cancer, squamous cell carcinoma presented with left lower lobe lung mass in addition to ipsilateral infrahilar nodal metastasis diagnosed in June 2024. She is not a good surgical candidate for resection. She is currently undergoing a course of concurrent chemoradiation with weekly carboplatin for AUC of 2 and paclitaxel 45 Mg/M2.  Status post 2 cycles.  She has been tolerating this treatment well except for the fatigue. I recommended for the patient to proceed with cycle #3 today as planned. For the hyponatremia, I will send her a refill of demeclocycline. I will see her back for follow-up visit in 2 weeks for evaluation before starting cycle #5. She was advised to call immediately if she has any other concerning symptoms in the interval. The patient voices understanding of current disease status and treatment options and is in agreement with the current care plan.  All questions were answered. The patient knows to call the clinic with any problems, questions or concerns. We can certainly see the patient much sooner if necessary.  The total time spent in the appointment was 20 minutes.  Disclaimer: This note was dictated with voice recognition  software. Similar sounding words can inadvertently be transcribed and may not be corrected upon review.

## 2022-08-04 NOTE — Patient Instructions (Signed)
[facility]  Discharge Instructions: Thank you for choosing [facility] to provide your oncology and hematology care.   If you have a lab appointment with the Cancer Center, please go directly to the Cancer Center and check in at the registration area.   Wear comfortable clothing and clothing appropriate for easy access to any Portacath or PICC line.   We strive to give you quality time with your provider. You may need to reschedule your appointment if you arrive late (15 or more minutes).  Arriving late affects you and other patients whose appointments are after yours.  Also, if you miss three or more appointments without notifying the office, you may be dismissed from the clinic at the provider's discretion.      For prescription refill requests, have your pharmacy contact our office and allow 72 hours for refills to be completed.    Today you received the following chemotherapy and/or immunotherapy agents : Taxol, Carboplatin      To help prevent nausea and vomiting after your treatment, we encourage you to take your nausea medication as directed.  BELOW ARE SYMPTOMS THAT SHOULD BE REPORTED IMMEDIATELY: *FEVER GREATER THAN 100.4 F (38 C) OR HIGHER *CHILLS OR SWEATING *NAUSEA AND VOMITING THAT IS NOT CONTROLLED WITH YOUR NAUSEA MEDICATION *UNUSUAL SHORTNESS OF BREATH *UNUSUAL BRUISING OR BLEEDING *URINARY PROBLEMS (pain or burning when urinating, or frequent urination) *BOWEL PROBLEMS (unusual diarrhea, constipation, pain near the anus) TENDERNESS IN MOUTH AND THROAT WITH OR WITHOUT PRESENCE OF ULCERS (sore throat, sores in mouth, or a toothache) UNUSUAL RASH, SWELLING OR PAIN  UNUSUAL VAGINAL DISCHARGE OR ITCHING   Items with * indicate a potential emergency and should be followed up as soon as possible or go to the Emergency Department if any problems should occur.  Please show the CHEMOTHERAPY ALERT CARD or IMMUNOTHERAPY ALERT  CARD at check-in to the Emergency Department and triage nurse.  Should you have questions after your visit or need to cancel or reschedule your appointment, please contact [facility]  Dept: [phone]  and follow the prompts.  Office hours are 8:00 a.m. to 4:30 p.m. Monday - Friday. Please note that voicemails left after 4:00 p.m. may not be returned until the following business day.  We are closed weekends and major holidays. You have access to a nurse at all times for urgent questions. Please call the main number to the clinic Dept: [phone] and follow the prompts.   For any non-urgent questions, you may also contact your provider using MyChart. We now offer e-Visits for anyone 18 and older to request care online for non-urgent symptoms. For details visit [email or web address].   Also download the MyChart app! Go to the app store, search "MyChart", open the app, select [place], and log in with your MyChart username and password.  

## 2022-08-04 NOTE — Addendum Note (Signed)
Addended by: Charma Igo on: 08/04/2022 01:29 PM   Modules accepted: Orders

## 2022-08-04 NOTE — Progress Notes (Signed)
Okay to treat with elevated pulse per Dr. Mohamed 

## 2022-08-05 ENCOUNTER — Ambulatory Visit
Admission: RE | Admit: 2022-08-05 | Discharge: 2022-08-05 | Disposition: A | Discharge status: Home / Self Care | Payer: Medicare HMO | Source: Ambulatory Visit | Attending: Radiation Oncology | Admitting: Radiation Oncology

## 2022-08-05 ENCOUNTER — Other Ambulatory Visit: Payer: Self-pay

## 2022-08-05 DIAGNOSIS — C50211 Malignant neoplasm of upper-inner quadrant of right female breast: Secondary | ICD-10-CM | POA: Diagnosis not present

## 2022-08-05 LAB — RAD ONC ARIA SESSION SUMMARY
Course Elapsed Days: 12
Plan Fractions Treated to Date: 8
Plan Prescribed Dose Per Fraction: 2 Gy
Plan Total Fractions Prescribed: 30
Plan Total Prescribed Dose: 60 Gy
Reference Point Dosage Given to Date: 16 Gy
Reference Point Session Dosage Given: 2 Gy
Session Number: 8

## 2022-08-06 ENCOUNTER — Other Ambulatory Visit: Payer: Self-pay

## 2022-08-06 ENCOUNTER — Ambulatory Visit
Admission: RE | Admit: 2022-08-06 | Discharge: 2022-08-06 | Disposition: A | Discharge status: Home / Self Care | Payer: Medicare HMO | Source: Ambulatory Visit | Attending: Radiation Oncology | Admitting: Radiation Oncology

## 2022-08-06 DIAGNOSIS — C50211 Malignant neoplasm of upper-inner quadrant of right female breast: Secondary | ICD-10-CM | POA: Diagnosis not present

## 2022-08-06 LAB — RAD ONC ARIA SESSION SUMMARY
Course Elapsed Days: 13
Plan Fractions Treated to Date: 9
Plan Prescribed Dose Per Fraction: 2 Gy
Plan Total Fractions Prescribed: 30
Plan Total Prescribed Dose: 60 Gy
Reference Point Dosage Given to Date: 18 Gy
Reference Point Session Dosage Given: 2 Gy
Session Number: 9

## 2022-08-07 ENCOUNTER — Other Ambulatory Visit: Payer: Self-pay

## 2022-08-07 ENCOUNTER — Ambulatory Visit: Payer: Medicare HMO

## 2022-08-07 DIAGNOSIS — C50211 Malignant neoplasm of upper-inner quadrant of right female breast: Secondary | ICD-10-CM | POA: Diagnosis not present

## 2022-08-07 LAB — RAD ONC ARIA SESSION SUMMARY
Course Elapsed Days: 14
Plan Fractions Treated to Date: 10
Plan Prescribed Dose Per Fraction: 2 Gy
Plan Total Fractions Prescribed: 30
Plan Total Prescribed Dose: 60 Gy
Reference Point Dosage Given to Date: 20 Gy
Reference Point Session Dosage Given: 2 Gy
Session Number: 10

## 2022-08-08 ENCOUNTER — Other Ambulatory Visit: Payer: Self-pay

## 2022-08-08 ENCOUNTER — Ambulatory Visit
Admission: RE | Admit: 2022-08-08 | Discharge: 2022-08-08 | Disposition: A | Discharge status: Home / Self Care | Payer: Medicare HMO | Source: Ambulatory Visit | Attending: Radiation Oncology | Admitting: Radiation Oncology

## 2022-08-08 DIAGNOSIS — C50211 Malignant neoplasm of upper-inner quadrant of right female breast: Secondary | ICD-10-CM | POA: Diagnosis not present

## 2022-08-08 LAB — RAD ONC ARIA SESSION SUMMARY
Course Elapsed Days: 15
Plan Fractions Treated to Date: 11
Plan Prescribed Dose Per Fraction: 2 Gy
Plan Total Fractions Prescribed: 30
Plan Total Prescribed Dose: 60 Gy
Reference Point Dosage Given to Date: 22 Gy
Reference Point Session Dosage Given: 2 Gy
Session Number: 11

## 2022-08-08 MED FILL — Dexamethasone Sodium Phosphate Inj 100 MG/10ML: INTRAMUSCULAR | Qty: 1 | Status: AC

## 2022-08-11 ENCOUNTER — Other Ambulatory Visit: Payer: Self-pay

## 2022-08-11 ENCOUNTER — Inpatient Hospital Stay: Payer: Medicare HMO

## 2022-08-11 ENCOUNTER — Ambulatory Visit
Admission: RE | Admit: 2022-08-11 | Discharge: 2022-08-11 | Disposition: A | Discharge status: Home / Self Care | Payer: Medicare HMO | Source: Ambulatory Visit | Attending: Radiation Oncology | Admitting: Radiation Oncology

## 2022-08-11 VITALS — BP 106/65 | HR 100 | Temp 97.4°F | Resp 17 | Wt 99.5 lb

## 2022-08-11 DIAGNOSIS — C50211 Malignant neoplasm of upper-inner quadrant of right female breast: Secondary | ICD-10-CM | POA: Diagnosis not present

## 2022-08-11 DIAGNOSIS — C3492 Malignant neoplasm of unspecified part of left bronchus or lung: Secondary | ICD-10-CM

## 2022-08-11 DIAGNOSIS — Z5111 Encounter for antineoplastic chemotherapy: Secondary | ICD-10-CM | POA: Diagnosis not present

## 2022-08-11 LAB — CBC WITH DIFFERENTIAL (CANCER CENTER ONLY)
Abs Immature Granulocytes: 0.05 10*3/uL (ref 0.00–0.07)
Basophils Absolute: 0 10*3/uL (ref 0.0–0.1)
Basophils Relative: 1 %
Eosinophils Absolute: 0 10*3/uL (ref 0.0–0.5)
Eosinophils Relative: 1 %
HCT: 35.2 % — ABNORMAL LOW (ref 36.0–46.0)
Hemoglobin: 12.1 g/dL (ref 12.0–15.0)
Immature Granulocytes: 1 %
Lymphocytes Relative: 15 %
Lymphs Abs: 0.9 10*3/uL (ref 0.7–4.0)
MCH: 29.5 pg (ref 26.0–34.0)
MCHC: 34.4 g/dL (ref 30.0–36.0)
MCV: 85.9 fL (ref 80.0–100.0)
Monocytes Absolute: 0.9 10*3/uL (ref 0.1–1.0)
Monocytes Relative: 14 %
Neutro Abs: 4.5 10*3/uL (ref 1.7–7.7)
Neutrophils Relative %: 68 %
Platelet Count: 278 10*3/uL (ref 150–400)
RBC: 4.1 MIL/uL (ref 3.87–5.11)
RDW: 13.1 % (ref 11.5–15.5)
WBC Count: 6.5 10*3/uL (ref 4.0–10.5)
nRBC: 0 % (ref 0.0–0.2)

## 2022-08-11 LAB — RAD ONC ARIA SESSION SUMMARY
Course Elapsed Days: 18
Plan Fractions Treated to Date: 12
Plan Prescribed Dose Per Fraction: 2 Gy
Plan Total Fractions Prescribed: 30
Plan Total Prescribed Dose: 60 Gy
Reference Point Dosage Given to Date: 24 Gy
Reference Point Session Dosage Given: 2 Gy
Session Number: 12

## 2022-08-11 LAB — CMP (CANCER CENTER ONLY)
ALT: 16 U/L (ref 0–44)
AST: 19 U/L (ref 15–41)
Albumin: 3.7 g/dL (ref 3.5–5.0)
Alkaline Phosphatase: 77 U/L (ref 38–126)
Anion gap: 7 (ref 5–15)
BUN: 11 mg/dL (ref 8–23)
CO2: 24 mmol/L (ref 22–32)
Calcium: 9 mg/dL (ref 8.9–10.3)
Chloride: 94 mmol/L — ABNORMAL LOW (ref 98–111)
Creatinine: 0.36 mg/dL — ABNORMAL LOW (ref 0.44–1.00)
GFR, Estimated: >60 mL/min (ref >60)
Glucose, Bld: 96 mg/dL (ref 70–99)
Potassium: 4.1 mmol/L (ref 3.5–5.1)
Sodium: 125 mmol/L — ABNORMAL LOW (ref 135–145)
Total Bilirubin: 0.3 mg/dL (ref 0.3–1.2)
Total Protein: 6.4 g/dL — ABNORMAL LOW (ref 6.5–8.1)

## 2022-08-11 MED ORDER — SODIUM CHLORIDE 0.9 % IV SOLN: Freq: Once | INTRAVENOUS | Status: AC

## 2022-08-11 MED ORDER — DIPHENHYDRAMINE HCL 50 MG/ML IJ SOLN
50.0000 mg | Freq: Once | INTRAMUSCULAR | Status: AC
  Administered 2022-08-11: 50 mg via INTRAVENOUS
  Filled 2022-08-11: qty 1

## 2022-08-11 MED ORDER — SODIUM CHLORIDE 0.9 % IV SOLN
45.0000 mg/m2 | Freq: Once | INTRAVENOUS | Status: AC
  Administered 2022-08-11: 60 mg via INTRAVENOUS
  Filled 2022-08-11: qty 10

## 2022-08-11 MED ORDER — SODIUM CHLORIDE 0.9 % IV SOLN
10.0000 mg | Freq: Once | INTRAVENOUS | Status: AC
  Administered 2022-08-11: 10 mg via INTRAVENOUS
  Filled 2022-08-11: qty 10

## 2022-08-11 MED ORDER — FAMOTIDINE IN NACL 20-0.9 MG/50ML-% IV SOLN
20.0000 mg | Freq: Once | INTRAVENOUS | Status: AC
  Administered 2022-08-11: 20 mg via INTRAVENOUS
  Filled 2022-08-11: qty 50

## 2022-08-11 MED ORDER — PALONOSETRON HCL INJECTION 0.25 MG/5ML
0.2500 mg | Freq: Once | INTRAVENOUS | Status: AC
  Administered 2022-08-11: 0.25 mg via INTRAVENOUS
  Filled 2022-08-11: qty 5

## 2022-08-11 MED ORDER — SODIUM CHLORIDE 0.9 % IV SOLN
113.0000 mg | Freq: Once | INTRAVENOUS | Status: AC
  Administered 2022-08-11: 110 mg via INTRAVENOUS
  Filled 2022-08-11: qty 11

## 2022-08-11 NOTE — Progress Notes (Signed)
Patient & her daughter report that patient was very exhausted from her tx last week - her tx was Monday, she began feeling very tired by Wednesday & by Friday she was so tired she wasn't sure she could come to her radiation appointment but she did.  She states she is still very tired today.  Dr Arbutus Ped informed, he states this is a side effect of the tx & it is ok to proceed with today's infusion.  Patient & her daughter informed, they verbalize understanding.

## 2022-08-11 NOTE — Progress Notes (Signed)
Nutrition Follow-up:  Patient with newly diagnosed stage III non-small cell lung cancer. Followed by Dr Shirline Frees and Roselind Messier.  Patient receiving chemotherapy and radiation.   Met with patient and daughter during infusion.  Patient reports that her appetite is good.  "I am eating my klondike bars and drinking boost plus shakes. Denies nutrition impact symptoms.  Having issues with fatigue.      Medications: reviewed  Labs: reviewed  Anthropometrics:   Weight 99 lb 8 oz today 100 lb 12.8 oz on 6/24 97 lb 12.8 oz on 6/10 UBW of 110-115 lb   NUTRITION DIAGNOSIS: Food and nutrition related knowledge deficit improved    INTERVENTION:  Continue to defer free water restriction to MD Continue high calorie, high protein diet Continue oral nutrition supplements    MONITORING, EVALUATION, GOAL: weight trends, intake   NEXT VISIT: Monday, August 5 during infusion  Lina Hitch B. Freida Busman, RD, LDN Registered Dietitian [phone]

## 2022-08-11 NOTE — Patient Instructions (Signed)
 [facility]  Discharge Instructions: Thank you for choosing [facility] to provide your oncology and hematology care.   If you have a lab appointment with the [facility], please go directly to the [facility] and check in at the registration area.   Wear comfortable clothing and clothing appropriate for easy access to any Portacath or PICC line.   We strive to give you quality time with your provider. You may need to reschedule your appointment if you arrive late (15 or more minutes).  Arriving late affects you and other patients whose appointments are after yours.  Also, if you miss three or more appointments without notifying the office, you may be dismissed from the clinic at the provider's discretion.      For prescription refill requests, have your pharmacy contact our office and allow 72 hours for refills to be completed.    Today you received the following chemotherapy and/or immunotherapy agents: Paclitaxel and Carboplatin      To help prevent nausea and vomiting after your treatment, we encourage you to take your nausea medication as directed.  BELOW ARE SYMPTOMS THAT SHOULD BE REPORTED IMMEDIATELY: *FEVER GREATER THAN 100.4 F (38 C) OR HIGHER *CHILLS OR SWEATING *NAUSEA AND VOMITING THAT IS NOT CONTROLLED WITH YOUR NAUSEA MEDICATION *UNUSUAL SHORTNESS OF BREATH *UNUSUAL BRUISING OR BLEEDING *URINARY PROBLEMS (pain or burning when urinating, or frequent urination) *BOWEL PROBLEMS (unusual diarrhea, constipation, pain near the anus) TENDERNESS IN MOUTH AND THROAT WITH OR WITHOUT PRESENCE OF ULCERS (sore throat, sores in mouth, or a toothache) UNUSUAL RASH, SWELLING OR PAIN  UNUSUAL VAGINAL DISCHARGE OR ITCHING   Items with * indicate a potential emergency and should be followed up as soon as possible or go to the Emergency Department if any problems should occur.  Please show the CHEMOTHERAPY ALERT CARD or IMMUNOTHERAPY  ALERT CARD at check-in to the Emergency Department and triage nurse.  Should you have questions after your visit or need to cancel or reschedule your appointment, please contact [facility]  Dept: [phone]  and follow the prompts.  Office hours are 8:00 a.m. to 4:30 p.m. Monday - Friday. Please note that voicemails left after 4:00 p.m. may not be returned until the following business day.  We are closed weekends and major holidays. You have access to a nurse at all times for urgent questions. Please call the main number to the clinic Dept: [phone] and follow the prompts.   For any non-urgent questions, you may also contact your provider using MyChart. We now offer e-Visits for anyone 22 and older to request care online for non-urgent symptoms. For details visit [email or web address].   Also download the MyChart app! Go to the app store, search "MyChart", open the app, select [facility], and log in with your MyChart username and password.

## 2022-08-12 ENCOUNTER — Ambulatory Visit
Admission: RE | Admit: 2022-08-12 | Discharge: 2022-08-12 | Disposition: A | Discharge status: Home / Self Care | Payer: Medicare HMO | Source: Ambulatory Visit | Attending: Radiation Oncology | Admitting: Radiation Oncology

## 2022-08-12 ENCOUNTER — Other Ambulatory Visit: Payer: Self-pay

## 2022-08-12 DIAGNOSIS — C50211 Malignant neoplasm of upper-inner quadrant of right female breast: Secondary | ICD-10-CM | POA: Diagnosis not present

## 2022-08-12 LAB — RAD ONC ARIA SESSION SUMMARY
Course Elapsed Days: 19
Plan Fractions Treated to Date: 13
Plan Prescribed Dose Per Fraction: 2 Gy
Plan Total Fractions Prescribed: 30
Plan Total Prescribed Dose: 60 Gy
Reference Point Dosage Given to Date: 26 Gy
Reference Point Session Dosage Given: 2 Gy
Session Number: 13

## 2022-08-13 ENCOUNTER — Other Ambulatory Visit: Payer: Self-pay

## 2022-08-13 ENCOUNTER — Ambulatory Visit
Admission: RE | Admit: 2022-08-13 | Discharge: 2022-08-13 | Disposition: A | Discharge status: Home / Self Care | Payer: Medicare HMO | Source: Ambulatory Visit | Attending: Radiation Oncology | Admitting: Radiation Oncology

## 2022-08-13 ENCOUNTER — Telehealth: Payer: Self-pay | Admitting: *Deleted

## 2022-08-13 ENCOUNTER — Other Ambulatory Visit: Payer: Self-pay | Admitting: Internal Medicine

## 2022-08-13 DIAGNOSIS — C50211 Malignant neoplasm of upper-inner quadrant of right female breast: Secondary | ICD-10-CM | POA: Diagnosis not present

## 2022-08-13 LAB — RAD ONC ARIA SESSION SUMMARY
Course Elapsed Days: 20
Plan Fractions Treated to Date: 14
Plan Prescribed Dose Per Fraction: 2 Gy
Plan Total Fractions Prescribed: 30
Plan Total Prescribed Dose: 60 Gy
Reference Point Dosage Given to Date: 28 Gy
Reference Point Session Dosage Given: 2 Gy
Session Number: 14

## 2022-08-13 MED ORDER — NICOTINE 14 MG/24HR TD PT24: 14.0000 mg | MEDICATED_PATCH | Freq: Every day | TRANSDERMAL | 0 refills | Status: AC

## 2022-08-13 NOTE — Telephone Encounter (Signed)
Patient's daughter called asking for the next step down for the nicotine patch to be prescribed and sent to [facility] for her mother, Barbara Green. She has stopped smoking and needs to get the next set of patches for the step down.

## 2022-08-14 ENCOUNTER — Other Ambulatory Visit: Payer: Self-pay

## 2022-08-14 ENCOUNTER — Ambulatory Visit
Admission: RE | Admit: 2022-08-14 | Discharge: 2022-08-14 | Disposition: A | Discharge status: Home / Self Care | Payer: Medicare HMO | Source: Ambulatory Visit | Attending: Radiation Oncology | Admitting: Radiation Oncology

## 2022-08-14 DIAGNOSIS — C50211 Malignant neoplasm of upper-inner quadrant of right female breast: Secondary | ICD-10-CM | POA: Diagnosis not present

## 2022-08-14 LAB — RAD ONC ARIA SESSION SUMMARY
Course Elapsed Days: 21
Plan Fractions Treated to Date: 15
Plan Prescribed Dose Per Fraction: 2 Gy
Plan Total Fractions Prescribed: 30
Plan Total Prescribed Dose: 60 Gy
Reference Point Dosage Given to Date: 30 Gy
Reference Point Session Dosage Given: 2 Gy
Session Number: 15

## 2022-08-15 ENCOUNTER — Ambulatory Visit: Payer: Medicare HMO

## 2022-08-15 ENCOUNTER — Encounter: Payer: Self-pay | Admitting: Internal Medicine

## 2022-08-15 MED FILL — Dexamethasone Sodium Phosphate Inj 100 MG/10ML: INTRAMUSCULAR | Qty: 1 | Status: AC

## 2022-08-18 ENCOUNTER — Other Ambulatory Visit: Payer: Self-pay

## 2022-08-18 ENCOUNTER — Encounter: Payer: Self-pay | Admitting: Internal Medicine

## 2022-08-18 ENCOUNTER — Inpatient Hospital Stay: Payer: Medicare HMO

## 2022-08-18 ENCOUNTER — Inpatient Hospital Stay: Payer: Medicare HMO | Admitting: Internal Medicine

## 2022-08-18 ENCOUNTER — Encounter: Payer: Self-pay | Admitting: Medical Oncology

## 2022-08-18 ENCOUNTER — Ambulatory Visit
Admission: RE | Admit: 2022-08-18 | Discharge: 2022-08-18 | Disposition: A | Discharge status: Home / Self Care | Payer: Medicare HMO | Source: Ambulatory Visit | Attending: Radiation Oncology | Admitting: Radiation Oncology

## 2022-08-18 VITALS — BP 109/73 | HR 91 | Resp 18

## 2022-08-18 DIAGNOSIS — C3492 Malignant neoplasm of unspecified part of left bronchus or lung: Secondary | ICD-10-CM

## 2022-08-18 DIAGNOSIS — Z5111 Encounter for antineoplastic chemotherapy: Secondary | ICD-10-CM | POA: Diagnosis not present

## 2022-08-18 DIAGNOSIS — C50211 Malignant neoplasm of upper-inner quadrant of right female breast: Secondary | ICD-10-CM | POA: Diagnosis not present

## 2022-08-18 LAB — CBC WITH DIFFERENTIAL (CANCER CENTER ONLY)
Abs Immature Granulocytes: 0.04 10*3/uL (ref 0.00–0.07)
Basophils Absolute: 0.1 10*3/uL (ref 0.0–0.1)
Basophils Relative: 1 %
Eosinophils Absolute: 0 10*3/uL (ref 0.0–0.5)
Eosinophils Relative: 1 %
HCT: 37.4 % (ref 36.0–46.0)
Hemoglobin: 12.6 g/dL (ref 12.0–15.0)
Immature Granulocytes: 1 %
Lymphocytes Relative: 16 %
Lymphs Abs: 0.9 10*3/uL (ref 0.7–4.0)
MCH: 29.6 pg (ref 26.0–34.0)
MCHC: 33.7 g/dL (ref 30.0–36.0)
MCV: 88 fL (ref 80.0–100.0)
Monocytes Absolute: 0.8 10*3/uL (ref 0.1–1.0)
Monocytes Relative: 14 %
Neutro Abs: 3.9 10*3/uL (ref 1.7–7.7)
Neutrophils Relative %: 67 %
Platelet Count: 223 10*3/uL (ref 150–400)
RBC: 4.25 MIL/uL (ref 3.87–5.11)
RDW: 13.3 % (ref 11.5–15.5)
WBC Count: 5.7 10*3/uL (ref 4.0–10.5)
nRBC: 0 % (ref 0.0–0.2)

## 2022-08-18 LAB — CMP (CANCER CENTER ONLY)
ALT: 17 U/L (ref 0–44)
AST: 20 U/L (ref 15–41)
Albumin: 4 g/dL (ref 3.5–5.0)
Alkaline Phosphatase: 73 U/L (ref 38–126)
Anion gap: 8 (ref 5–15)
BUN: 10 mg/dL (ref 8–23)
CO2: 25 mmol/L (ref 22–32)
Calcium: 9.4 mg/dL (ref 8.9–10.3)
Chloride: 97 mmol/L — ABNORMAL LOW (ref 98–111)
Creatinine: 0.41 mg/dL — ABNORMAL LOW (ref 0.44–1.00)
GFR, Estimated: >60 mL/min (ref >60)
Glucose, Bld: 85 mg/dL (ref 70–99)
Potassium: 4.3 mmol/L (ref 3.5–5.1)
Sodium: 130 mmol/L — ABNORMAL LOW (ref 135–145)
Total Bilirubin: 0.5 mg/dL (ref 0.3–1.2)
Total Protein: 6.5 g/dL (ref 6.5–8.1)

## 2022-08-18 LAB — RAD ONC ARIA SESSION SUMMARY
Course Elapsed Days: 25
Plan Fractions Treated to Date: 16
Plan Prescribed Dose Per Fraction: 2 Gy
Plan Total Fractions Prescribed: 30
Plan Total Prescribed Dose: 60 Gy
Reference Point Dosage Given to Date: 32 Gy
Reference Point Session Dosage Given: 2 Gy
Session Number: 16

## 2022-08-18 MED ORDER — SODIUM CHLORIDE 0.9 % IV SOLN
113.0000 mg | Freq: Once | INTRAVENOUS | Status: AC
  Administered 2022-08-18: 110 mg via INTRAVENOUS
  Filled 2022-08-18: qty 11

## 2022-08-18 MED ORDER — DIPHENHYDRAMINE HCL 50 MG/ML IJ SOLN
50.0000 mg | Freq: Once | INTRAMUSCULAR | Status: AC
  Administered 2022-08-18: 50 mg via INTRAVENOUS
  Filled 2022-08-18: qty 1

## 2022-08-18 MED ORDER — FAMOTIDINE IN NACL 20-0.9 MG/50ML-% IV SOLN
20.0000 mg | Freq: Once | INTRAVENOUS | Status: AC
  Administered 2022-08-18: 20 mg via INTRAVENOUS
  Filled 2022-08-18: qty 50

## 2022-08-18 MED ORDER — SODIUM CHLORIDE 0.9 % IV SOLN
45.0000 mg/m2 | Freq: Once | INTRAVENOUS | Status: AC
  Administered 2022-08-18: 60 mg via INTRAVENOUS
  Filled 2022-08-18: qty 10

## 2022-08-18 MED ORDER — PALONOSETRON HCL INJECTION 0.25 MG/5ML
0.2500 mg | Freq: Once | INTRAVENOUS | Status: AC
  Administered 2022-08-18: 0.25 mg via INTRAVENOUS
  Filled 2022-08-18: qty 5

## 2022-08-18 MED ORDER — SODIUM CHLORIDE 0.9 % IV SOLN: Freq: Once | INTRAVENOUS | Status: AC

## 2022-08-18 MED ORDER — SODIUM CHLORIDE 0.9 % IV SOLN
10.0000 mg | Freq: Once | INTRAVENOUS | Status: AC
  Administered 2022-08-18: 10 mg via INTRAVENOUS
  Filled 2022-08-18: qty 10

## 2022-08-18 MED ORDER — SODIUM CHLORIDE 0.9 % IV SOLN: INTRAVENOUS | Status: AC

## 2022-08-18 NOTE — Progress Notes (Signed)
Patient seen by Dr. Gypsy Balsam are not all within treatment parameters. Heart rate = 110. Per Dr/.Mohamed ,it is ok to treat pt today with Taxol and Carboplatin and heart rate of 110.  Labs reviewed: and are within treatment parameters.  Per physician team, patient is ready for treatment and there are NO modifications to the treatment plan.  Dr Arbutus Ped ordering IVF along with tx.

## 2022-08-18 NOTE — Patient Instructions (Signed)
[facility]  Discharge Instructions: Thank you for choosing [facility] to provide your oncology and hematology care.   If you have a lab appointment with the Cancer Center, please go directly to the Cancer Center and check in at the registration area.   Wear comfortable clothing and clothing appropriate for easy access to any Portacath or PICC line.   We strive to give you quality time with your provider. You may need to reschedule your appointment if you arrive late (15 or more minutes).  Arriving late affects you and other patients whose appointments are after yours.  Also, if you miss three or more appointments without notifying the office, you may be dismissed from the clinic at the provider's discretion.      For prescription refill requests, have your pharmacy contact our office and allow 72 hours for refills to be completed.    Today you received the following chemotherapy and/or immunotherapy agents : Taxol, Carboplatin      To help prevent nausea and vomiting after your treatment, we encourage you to take your nausea medication as directed.  BELOW ARE SYMPTOMS THAT SHOULD BE REPORTED IMMEDIATELY: *FEVER GREATER THAN 100.4 F (38 C) OR HIGHER *CHILLS OR SWEATING *NAUSEA AND VOMITING THAT IS NOT CONTROLLED WITH YOUR NAUSEA MEDICATION *UNUSUAL SHORTNESS OF BREATH *UNUSUAL BRUISING OR BLEEDING *URINARY PROBLEMS (pain or burning when urinating, or frequent urination) *BOWEL PROBLEMS (unusual diarrhea, constipation, pain near the anus) TENDERNESS IN MOUTH AND THROAT WITH OR WITHOUT PRESENCE OF ULCERS (sore throat, sores in mouth, or a toothache) UNUSUAL RASH, SWELLING OR PAIN  UNUSUAL VAGINAL DISCHARGE OR ITCHING   Items with * indicate a potential emergency and should be followed up as soon as possible or go to the Emergency Department if any problems should occur.  Please show the CHEMOTHERAPY ALERT CARD or IMMUNOTHERAPY ALERT  CARD at check-in to the Emergency Department and triage nurse.  Should you have questions after your visit or need to cancel or reschedule your appointment, please contact [facility]  Dept: [phone]  and follow the prompts.  Office hours are 8:00 a.m. to 4:30 p.m. Monday - Friday. Please note that voicemails left after 4:00 p.m. may not be returned until the following business day.  We are closed weekends and major holidays. You have access to a nurse at all times for urgent questions. Please call the main number to the clinic Dept: [phone] and follow the prompts.   For any non-urgent questions, you may also contact your provider using MyChart. We now offer e-Visits for anyone 18 and older to request care online for non-urgent symptoms. For details visit [email or web address].   Also download the MyChart app! Go to the app store, search "MyChart", open the app, select [place], and log in with your MyChart username and password.  

## 2022-08-18 NOTE — Progress Notes (Signed)
[facility] Telephone:([phone]   Fax:(336) [phone]  OFFICE PROGRESS NOTE  Barbara Skeans, MD [address] [place] [address]  DIAGNOSIS: Stage IIb/IIIa (T2b, N1-2, M0) non-small cell lung cancer, squamous cell carcinoma presented with large left lower lobe lung mass in addition to left hilar and suspicious mediastinal lymphadenopathy diagnosed in May 2024  PRIOR THERAPY: None  CURRENT THERAPY: A course of concurrent chemoradiation with weekly carboplatin for AUC of 2 and paclitaxel 45 Mg/M2.  First dose July 21, 2022.  Status post 4 cycles.  INTERVAL HISTORY: Barbara Green 82 y.o. female returns to the clinic today for follow-up visit accompanied by her daughter.  The patient is feeling fine today with no concerning complaints except for increasing cough likely secondary to radiation treatment.  She also has mild a dyne aphasia.  She denied having any chest pain, shortness of breath or hemoptysis.  She has no nausea, vomiting, diarrhea or constipation.  She has no headache or visual changes.  She lost 2 pounds of her weight recently.  She is here today for evaluation before starting cycle #5.  MEDICAL HISTORY: Past Medical History:  Diagnosis Date   History of radiation therapy 06/29/19-07/29/19   Right breast IMRT- Dr. Roselind Messier   Hypertension     ALLERGIES:  has No Known Allergies.  MEDICATIONS:  Current Outpatient Medications  Medication Sig Dispense Refill   BONINE 25 MG CHEW Chew 25 mg by mouth daily as needed (for vertigo).     demeclocycline (DECLOMYCIN) 150 MG tablet Take 1 tablet (150 mg total) by mouth 2 (two) times daily. 60 tablet 0   lisinopril (ZESTRIL) 5 MG tablet Take 1 tablet (5 mg total) by mouth daily. 90 tablet 1   nicotine (NICODERM CQ - DOSED IN MG/24 HOURS) 14 mg/24hr patch Place 1 patch (14 mg total) onto the skin daily. 30 patch 0   prochlorperazine (COMPAZINE) 10 MG tablet Take 1 tablet (10 mg total) by mouth every 6  (six) hours as needed. (Patient not taking: Reported on 07/16/2022) 30 tablet 2   UNKNOWN TO PATIENT Take 1 tablet by mouth See admin instructions. Unnamed allergy tablet- Take 1 tablet by mouth once a day as needed for seasonal allergies     No current facility-administered medications for this visit.    SURGICAL HISTORY:  Past Surgical History:  Procedure Laterality Date   BREAST LUMPECTOMY WITH SENTINEL LYMPH NODE BIOPSY Right 05/17/2019   Procedure: RIGHT BREAST LUMPECTOMY WITH SENTINEL LYMPH NODE BIOPSY;  Surgeon: Abigail Miyamoto, MD;  Location: [facility];  Service: General;  Laterality: Right;   BRONCHIAL BIOPSY  06/12/2022   Procedure: BRONCHIAL BIOPSIES;  Surgeon: Omar Person, MD;  Location: [facility] ENDOSCOPY;  Service: Pulmonary;;   BRONCHIAL NEEDLE ASPIRATION BIOPSY  06/12/2022   Procedure: BRONCHIAL NEEDLE ASPIRATION BIOPSIES;  Surgeon: Omar Person, MD;  Location[facility] ENDOSCOPY;  Service: Pulmonary;;   FIDUCIAL MARKER PLACEMENT  06/12/2022   Procedure: FIDUCIAL MARKER PLACEMENT;  Surgeon: Omar Person, MD;  Location: [facility] END[facility]COPY;  Service: Pulmonary;;   NO PAST SURGERIES     VIDEO BRONCHOSCOPY WITH ENDOBRONCHIAL ULTRASOUND N/A 06/12/2022   Procedure: VIDEO BRONCHOSCOPY WITH ENDOBRONCHIAL ULTRASOUND;  Surgeon: Omar Person, MD;  Location: [facility] ENDOSCOPY;  Service: Pulmonary;  Laterality: N/A;    REVIEW OF SYSTEMS:  A comprehensive review of systems was negative except for: Constitutional: positive for fatigue   PHYSICAL EXAMINATION: General appearance: alert, cooperative, fatigued, and  no distress Head: Normocephalic, without obvious abnormality, atraumatic Neck: no adenopathy, no JVD, supple, symmetrical, trachea midline, and thyroid not enlarged, symmetric, no tenderness/mass/nodules Lymph nodes: Cervical, supraclavicular, and axillary nodes normal. Resp: clear to auscultation bilaterally Back: symmetric, no curvature. ROM normal. No CVA  tenderness. Cardio: regular rate and rhythm, S1, S2 normal, no murmur, click, rub or gallop GI: soft, non-tender; bowel sounds normal; no masses,  no organomegaly Extremities: extremities normal, atraumatic, no cyanosis or edema  ECOG PERFORMANCE STATUS: 1 - Symptomatic but completely ambulatory  Blood pressure 94/62, pulse (!) 110, temperature (!) 97.5 F (36.4 C), temperature source Oral, resp. rate 16, height 5' (1.524 m), weight 97 lb 12.8 oz (44.4 kg), SpO2 100%.  LABORATORY DATA: Lab Results  Component Value Date   WBC 5.7 08/18/2022   HGB 12.6 08/18/2022   HCT 37.4 08/18/2022   MCV 88.0 08/18/2022   PLT 223 08/18/2022      Chemistry      Component Value Date/Time   NA 130 (L) 08/18/2022 1009   NA 124 (L) 05/26/2022 1323   K 4.3 08/18/2022 1009   CL 97 (L) 08/18/2022 1009   CO2 25 08/18/2022 1009   BUN 10 08/18/2022 1009   BUN 7 (L) 05/26/2022 1323   CREATININE 0.41 (L) 08/18/2022 1009      Component Value Date/Time   CALCIUM 9.4 08/18/2022 1009   ALKPHOS 73 08/18/2022 1009   AST 20 08/18/2022 1009   ALT 17 08/18/2022 1009   BILITOT 0.5 08/18/2022 1009       RADIOGRAPHIC STUDIES: No results found.  ASSESSMENT AND PLAN: This is a very pleasant 82 years old white female recently diagnosed with a stage IIb (T2b, N1, M0) non-small cell lung cancer, squamous cell carcinoma presented with left lower lobe lung mass in addition to ipsilateral infrahilar nodal metastasis diagnosed in June 2024. She is not a good surgical candidate for resection. She is currently undergoing a course of concurrent chemoradiation with weekly carboplatin for AUC of 2 and paclitaxel 45 Mg/M2.  Status post 4 cycles.  The patient has been tolerating this treatment well with no concerning adverse effect except for the mild fatigue. I recommended for her to proceed with cycle #5 today as planned. For the hyponatremia, I will send her a refill of demeclocycline. I will see her back for follow-up  visit in 2 weeks for evaluation before the next cycle of her treatment. The patient was advised to call immediately if she has any concerning symptoms in the interval. The patient voices understanding of current disease status and treatment options and is in agreement with the current care plan.  All questions were answered. The patient knows to call the clinic with any problems, questions or concerns. We can certainly see the patient much sooner if necessary.  The total time spent in the appointment was 20 minutes.  Disclaimer: This note was dictated with voice recognition software. Similar sounding words can inadvertently be transcribed and may not be corrected upon review.

## 2022-08-19 ENCOUNTER — Other Ambulatory Visit: Payer: Self-pay

## 2022-08-19 ENCOUNTER — Ambulatory Visit
Admission: RE | Admit: 2022-08-19 | Discharge: 2022-08-19 | Disposition: A | Discharge status: Home / Self Care | Payer: Medicare HMO | Source: Ambulatory Visit | Attending: Radiation Oncology | Admitting: Radiation Oncology

## 2022-08-19 ENCOUNTER — Other Ambulatory Visit: Payer: Self-pay | Admitting: Radiation Oncology

## 2022-08-19 DIAGNOSIS — C50211 Malignant neoplasm of upper-inner quadrant of right female breast: Secondary | ICD-10-CM | POA: Diagnosis not present

## 2022-08-19 LAB — RAD ONC ARIA SESSION SUMMARY
Course Elapsed Days: 26
Plan Fractions Treated to Date: 17
Plan Prescribed Dose Per Fraction: 2 Gy
Plan Total Fractions Prescribed: 30
Plan Total Prescribed Dose: 60 Gy
Reference Point Dosage Given to Date: 34 Gy
Reference Point Session Dosage Given: 2 Gy
Session Number: 17

## 2022-08-19 MED ORDER — SUCRALFATE 1 G PO TABS: 1.0000 g | ORAL_TABLET | Freq: Three times a day (TID) | ORAL | 1 refills | Status: DC

## 2022-08-20 ENCOUNTER — Other Ambulatory Visit: Payer: Self-pay

## 2022-08-20 ENCOUNTER — Ambulatory Visit: Payer: Medicare HMO

## 2022-08-20 DIAGNOSIS — C50211 Malignant neoplasm of upper-inner quadrant of right female breast: Secondary | ICD-10-CM | POA: Diagnosis not present

## 2022-08-20 LAB — RAD ONC ARIA SESSION SUMMARY
Course Elapsed Days: 27
Plan Fractions Treated to Date: 18
Plan Prescribed Dose Per Fraction: 2 Gy
Plan Total Fractions Prescribed: 30
Plan Total Prescribed Dose: 60 Gy
Reference Point Dosage Given to Date: 36 Gy
Reference Point Session Dosage Given: 2 Gy
Session Number: 18

## 2022-08-21 ENCOUNTER — Other Ambulatory Visit: Payer: Self-pay

## 2022-08-21 ENCOUNTER — Ambulatory Visit: Payer: Medicare HMO

## 2022-08-21 DIAGNOSIS — C50211 Malignant neoplasm of upper-inner quadrant of right female breast: Secondary | ICD-10-CM | POA: Diagnosis not present

## 2022-08-21 LAB — RAD ONC ARIA SESSION SUMMARY
Course Elapsed Days: 28
Plan Fractions Treated to Date: 19
Plan Prescribed Dose Per Fraction: 2 Gy
Plan Total Fractions Prescribed: 30
Plan Total Prescribed Dose: 60 Gy
Reference Point Dosage Given to Date: 38 Gy
Reference Point Session Dosage Given: 2 Gy
Session Number: 19

## 2022-08-22 ENCOUNTER — Other Ambulatory Visit: Payer: Self-pay

## 2022-08-22 ENCOUNTER — Ambulatory Visit
Admission: RE | Admit: 2022-08-22 | Discharge: 2022-08-22 | Disposition: A | Discharge status: Home / Self Care | Payer: Medicare HMO | Source: Ambulatory Visit | Attending: Radiation Oncology | Admitting: Radiation Oncology

## 2022-08-22 DIAGNOSIS — C50211 Malignant neoplasm of upper-inner quadrant of right female breast: Secondary | ICD-10-CM | POA: Diagnosis not present

## 2022-08-22 LAB — RAD ONC ARIA SESSION SUMMARY
Course Elapsed Days: 29
Plan Fractions Treated to Date: 20
Plan Prescribed Dose Per Fraction: 2 Gy
Plan Total Fractions Prescribed: 30
Plan Total Prescribed Dose: 60 Gy
Reference Point Dosage Given to Date: 40 Gy
Reference Point Session Dosage Given: 2 Gy
Session Number: 20

## 2022-08-22 MED FILL — Dexamethasone Sodium Phosphate Inj 100 MG/10ML: INTRAMUSCULAR | Qty: 1 | Status: AC

## 2022-08-25 ENCOUNTER — Inpatient Hospital Stay: Payer: Medicare HMO

## 2022-08-25 ENCOUNTER — Ambulatory Visit
Admission: RE | Admit: 2022-08-25 | Discharge: 2022-08-25 | Disposition: A | Discharge status: Home / Self Care | Payer: Medicare HMO | Source: Ambulatory Visit | Attending: Radiation Oncology | Admitting: Radiation Oncology

## 2022-08-25 ENCOUNTER — Ambulatory Visit: Payer: Medicare HMO

## 2022-08-25 ENCOUNTER — Other Ambulatory Visit: Payer: Self-pay

## 2022-08-25 ENCOUNTER — Other Ambulatory Visit: Payer: Medicare HMO

## 2022-08-25 ENCOUNTER — Ambulatory Visit: Payer: Medicare HMO | Admitting: Physician Assistant

## 2022-08-25 VITALS — BP 99/63 | HR 101 | Temp 97.5°F | Resp 18 | Wt 97.8 lb

## 2022-08-25 DIAGNOSIS — Z5111 Encounter for antineoplastic chemotherapy: Secondary | ICD-10-CM | POA: Diagnosis not present

## 2022-08-25 DIAGNOSIS — C50211 Malignant neoplasm of upper-inner quadrant of right female breast: Secondary | ICD-10-CM | POA: Diagnosis not present

## 2022-08-25 DIAGNOSIS — C3492 Malignant neoplasm of unspecified part of left bronchus or lung: Secondary | ICD-10-CM

## 2022-08-25 LAB — RAD ONC ARIA SESSION SUMMARY
Course Elapsed Days: 32
Plan Fractions Treated to Date: 21
Plan Prescribed Dose Per Fraction: 2 Gy
Plan Total Fractions Prescribed: 30
Plan Total Prescribed Dose: 60 Gy
Reference Point Dosage Given to Date: 42 Gy
Reference Point Session Dosage Given: 2 Gy
Session Number: 21

## 2022-08-25 LAB — CMP (CANCER CENTER ONLY)
ALT: 15 U/L (ref 0–44)
AST: 19 U/L (ref 15–41)
Albumin: 3.9 g/dL (ref 3.5–5.0)
Alkaline Phosphatase: 76 U/L (ref 38–126)
Anion gap: 10 (ref 5–15)
BUN: 9 mg/dL (ref 8–23)
CO2: 26 mmol/L (ref 22–32)
Calcium: 10 mg/dL (ref 8.9–10.3)
Chloride: 95 mmol/L — ABNORMAL LOW (ref 98–111)
Creatinine: 0.33 mg/dL — ABNORMAL LOW (ref 0.44–1.00)
GFR, Estimated: >60 mL/min (ref >60)
Glucose, Bld: 90 mg/dL (ref 70–99)
Potassium: 3.6 mmol/L (ref 3.5–5.1)
Sodium: 131 mmol/L — ABNORMAL LOW (ref 135–145)
Total Bilirubin: 0.5 mg/dL (ref 0.3–1.2)
Total Protein: 6.6 g/dL (ref 6.5–8.1)

## 2022-08-25 LAB — CBC WITH DIFFERENTIAL (CANCER CENTER ONLY)
Abs Immature Granulocytes: 0.03 10*3/uL (ref 0.00–0.07)
Basophils Absolute: 0 10*3/uL (ref 0.0–0.1)
Basophils Relative: 1 %
Eosinophils Absolute: 0.1 10*3/uL (ref 0.0–0.5)
Eosinophils Relative: 1 %
HCT: 35.7 % — ABNORMAL LOW (ref 36.0–46.0)
Hemoglobin: 12.5 g/dL (ref 12.0–15.0)
Immature Granulocytes: 1 %
Lymphocytes Relative: 19 %
Lymphs Abs: 1 10*3/uL (ref 0.7–4.0)
MCH: 30.2 pg (ref 26.0–34.0)
MCHC: 35 g/dL (ref 30.0–36.0)
MCV: 86.2 fL (ref 80.0–100.0)
Monocytes Absolute: 0.9 10*3/uL (ref 0.1–1.0)
Monocytes Relative: 17 %
Neutro Abs: 3.4 10*3/uL (ref 1.7–7.7)
Neutrophils Relative %: 61 %
Platelet Count: 254 10*3/uL (ref 150–400)
RBC: 4.14 MIL/uL (ref 3.87–5.11)
RDW: 13.8 % (ref 11.5–15.5)
WBC Count: 5.5 10*3/uL (ref 4.0–10.5)
nRBC: 0 % (ref 0.0–0.2)

## 2022-08-25 MED ORDER — PALONOSETRON HCL INJECTION 0.25 MG/5ML
0.2500 mg | Freq: Once | INTRAVENOUS | Status: AC
  Administered 2022-08-25: 0.25 mg via INTRAVENOUS
  Filled 2022-08-25: qty 5

## 2022-08-25 MED ORDER — FAMOTIDINE IN NACL 20-0.9 MG/50ML-% IV SOLN
20.0000 mg | Freq: Once | INTRAVENOUS | Status: AC
  Administered 2022-08-25: 20 mg via INTRAVENOUS
  Filled 2022-08-25: qty 50

## 2022-08-25 MED ORDER — SODIUM CHLORIDE 0.9 % IV SOLN
45.0000 mg/m2 | Freq: Once | INTRAVENOUS | Status: AC
  Administered 2022-08-25: 60 mg via INTRAVENOUS
  Filled 2022-08-25: qty 10

## 2022-08-25 MED ORDER — SODIUM CHLORIDE 0.9 % IV SOLN
113.0000 mg | Freq: Once | INTRAVENOUS | Status: AC
  Administered 2022-08-25: 110 mg via INTRAVENOUS
  Filled 2022-08-25: qty 11

## 2022-08-25 MED ORDER — SODIUM CHLORIDE 0.9 % IV SOLN
10.0000 mg | Freq: Once | INTRAVENOUS | Status: AC
  Administered 2022-08-25: 10 mg via INTRAVENOUS
  Filled 2022-08-25: qty 10

## 2022-08-25 MED ORDER — SODIUM CHLORIDE 0.9 % IV SOLN: Freq: Once | INTRAVENOUS | Status: AC

## 2022-08-25 MED ORDER — DIPHENHYDRAMINE HCL 50 MG/ML IJ SOLN
50.0000 mg | Freq: Once | INTRAMUSCULAR | Status: AC
  Administered 2022-08-25: 50 mg via INTRAVENOUS
  Filled 2022-08-25: qty 1

## 2022-08-25 NOTE — Patient Instructions (Signed)
[facility]  Discharge Instructions: Thank you for choosing [facility] to provide your oncology and hematology care.   If you have a lab appointment with the Cancer Center, please go directly to the Cancer Center and check in at the registration area.   Wear comfortable clothing and clothing appropriate for easy access to any Portacath or PICC line.   We strive to give you quality time with your provider. You may need to reschedule your appointment if you arrive late (15 or more minutes).  Arriving late affects you and other patients whose appointments are after yours.  Also, if you miss three or more appointments without notifying the office, you may be dismissed from the clinic at the provider's discretion.      For prescription refill requests, have your pharmacy contact our office and allow 72 hours for refills to be completed.    Today you received the following chemotherapy and/or immunotherapy agents Taxol Carbo      To help prevent nausea and vomiting after your treatment, we encourage you to take your nausea medication as directed.  BELOW ARE SYMPTOMS THAT SHOULD BE REPORTED IMMEDIATELY: *FEVER GREATER THAN 100.4 F (38 C) OR HIGHER *CHILLS OR SWEATING *NAUSEA AND VOMITING THAT IS NOT CONTROLLED WITH YOUR NAUSEA MEDICATION *UNUSUAL SHORTNESS OF BREATH *UNUSUAL BRUISING OR BLEEDING *URINARY PROBLEMS (pain or burning when urinating, or frequent urination) *BOWEL PROBLEMS (unusual diarrhea, constipation, pain near the anus) TENDERNESS IN MOUTH AND THROAT WITH OR WITHOUT PRESENCE OF ULCERS (sore throat, sores in mouth, or a toothache) UNUSUAL RASH, SWELLING OR PAIN  UNUSUAL VAGINAL DISCHARGE OR ITCHING   Items with * indicate a potential emergency and should be followed up as soon as possible or go to the Emergency Department if any problems should occur.  Please show the CHEMOTHERAPY ALERT CARD or IMMUNOTHERAPY ALERT CARD at  check-in to the Emergency Department and triage nurse.  Should you have questions after your visit or need to cancel or reschedule your appointment, please contact [facility]  Dept: [phone]  and follow the prompts.  Office hours are 8:00 a.m. to 4:30 p.m. Monday - Friday. Please note that voicemails left after 4:00 p.m. may not be returned until the following business day.  We are closed weekends and major holidays. You have access to a nurse at all times for urgent questions. Please call the main number to the clinic Dept: [phone] and follow the prompts.   For any non-urgent questions, you may also contact your provider using MyChart. We now offer e-Visits for anyone 18 and older to request care online for non-urgent symptoms. For details visit [email or web address].   Also download the MyChart app! Go to the app store, search "MyChart", open the app, select [place], and log in with your MyChart username and password.   

## 2022-08-25 NOTE — Progress Notes (Signed)
Per D. Heilingoetter PA, ok to treat with tachycardia today.

## 2022-08-26 ENCOUNTER — Ambulatory Visit
Admission: RE | Admit: 2022-08-26 | Discharge: 2022-08-26 | Disposition: A | Discharge status: Home / Self Care | Payer: Medicare HMO | Source: Ambulatory Visit | Attending: Radiation Oncology | Admitting: Radiation Oncology

## 2022-08-26 ENCOUNTER — Other Ambulatory Visit: Payer: Self-pay

## 2022-08-26 DIAGNOSIS — C50211 Malignant neoplasm of upper-inner quadrant of right female breast: Secondary | ICD-10-CM | POA: Diagnosis not present

## 2022-08-26 LAB — RAD ONC ARIA SESSION SUMMARY
Course Elapsed Days: 33
Plan Fractions Treated to Date: 22
Plan Prescribed Dose Per Fraction: 2 Gy
Plan Total Fractions Prescribed: 30
Plan Total Prescribed Dose: 60 Gy
Reference Point Dosage Given to Date: 44 Gy
Reference Point Session Dosage Given: 2 Gy
Session Number: 22

## 2022-08-27 ENCOUNTER — Ambulatory Visit
Admission: RE | Admit: 2022-08-27 | Discharge: 2022-08-27 | Disposition: A | Discharge status: Home / Self Care | Payer: Medicare HMO | Source: Ambulatory Visit | Attending: Radiation Oncology | Admitting: Radiation Oncology

## 2022-08-27 ENCOUNTER — Other Ambulatory Visit: Payer: Self-pay

## 2022-08-27 DIAGNOSIS — C50211 Malignant neoplasm of upper-inner quadrant of right female breast: Secondary | ICD-10-CM | POA: Diagnosis not present

## 2022-08-27 LAB — RAD ONC ARIA SESSION SUMMARY
Course Elapsed Days: 34
Plan Fractions Treated to Date: 23
Plan Prescribed Dose Per Fraction: 2 Gy
Plan Total Fractions Prescribed: 30
Plan Total Prescribed Dose: 60 Gy
Reference Point Dosage Given to Date: 46 Gy
Reference Point Session Dosage Given: 2 Gy
Session Number: 23

## 2022-08-28 ENCOUNTER — Ambulatory Visit
Admission: RE | Admit: 2022-08-28 | Discharge: 2022-08-28 | Disposition: A | Discharge status: Home / Self Care | Payer: Medicare HMO | Source: Ambulatory Visit | Attending: Radiation Oncology | Admitting: Radiation Oncology

## 2022-08-28 ENCOUNTER — Other Ambulatory Visit: Payer: Self-pay

## 2022-08-28 DIAGNOSIS — C3492 Malignant neoplasm of unspecified part of left bronchus or lung: Secondary | ICD-10-CM | POA: Insufficient documentation

## 2022-08-28 LAB — RAD ONC ARIA SESSION SUMMARY
Course Elapsed Days: 35
Plan Fractions Treated to Date: 24
Plan Prescribed Dose Per Fraction: 2 Gy
Plan Total Fractions Prescribed: 30
Plan Total Prescribed Dose: 60 Gy
Reference Point Dosage Given to Date: 48 Gy
Reference Point Session Dosage Given: 2 Gy
Session Number: 24

## 2022-08-29 ENCOUNTER — Ambulatory Visit
Admission: RE | Admit: 2022-08-29 | Discharge: 2022-08-29 | Disposition: A | Discharge status: Home / Self Care | Payer: Medicare HMO | Source: Ambulatory Visit | Attending: Radiation Oncology | Admitting: Radiation Oncology

## 2022-08-29 ENCOUNTER — Other Ambulatory Visit: Payer: Self-pay

## 2022-08-29 DIAGNOSIS — C3492 Malignant neoplasm of unspecified part of left bronchus or lung: Secondary | ICD-10-CM | POA: Diagnosis not present

## 2022-08-29 LAB — RAD ONC ARIA SESSION SUMMARY
Course Elapsed Days: 36
Plan Fractions Treated to Date: 25
Plan Prescribed Dose Per Fraction: 2 Gy
Plan Total Fractions Prescribed: 30
Plan Total Prescribed Dose: 60 Gy
Reference Point Dosage Given to Date: 50 Gy
Reference Point Session Dosage Given: 2 Gy
Session Number: 25

## 2022-08-29 MED FILL — Dexamethasone Sodium Phosphate Inj 100 MG/10ML: INTRAMUSCULAR | Qty: 1 | Status: AC

## 2022-09-01 ENCOUNTER — Inpatient Hospital Stay: Payer: Medicare HMO | Attending: Physician Assistant

## 2022-09-01 ENCOUNTER — Other Ambulatory Visit: Payer: Self-pay

## 2022-09-01 ENCOUNTER — Ambulatory Visit
Admission: RE | Admit: 2022-09-01 | Discharge: 2022-09-01 | Disposition: A | Discharge status: Home / Self Care | Payer: Medicare HMO | Source: Ambulatory Visit | Attending: Radiation Oncology | Admitting: Radiation Oncology

## 2022-09-01 ENCOUNTER — Inpatient Hospital Stay: Payer: Medicare HMO | Admitting: Internal Medicine

## 2022-09-01 ENCOUNTER — Inpatient Hospital Stay: Payer: Medicare HMO

## 2022-09-01 VITALS — BP 100/56 | HR 100 | Temp 97.5°F | Resp 16 | Ht 60.0 in | Wt 96.8 lb

## 2022-09-01 VITALS — BP 111/68 | HR 90

## 2022-09-01 DIAGNOSIS — E871 Hypo-osmolality and hyponatremia: Secondary | ICD-10-CM | POA: Insufficient documentation

## 2022-09-01 DIAGNOSIS — C3432 Malignant neoplasm of lower lobe, left bronchus or lung: Secondary | ICD-10-CM | POA: Diagnosis present

## 2022-09-01 DIAGNOSIS — Z5111 Encounter for antineoplastic chemotherapy: Secondary | ICD-10-CM | POA: Insufficient documentation

## 2022-09-01 DIAGNOSIS — C3490 Malignant neoplasm of unspecified part of unspecified bronchus or lung: Secondary | ICD-10-CM | POA: Diagnosis not present

## 2022-09-01 DIAGNOSIS — C3492 Malignant neoplasm of unspecified part of left bronchus or lung: Secondary | ICD-10-CM

## 2022-09-01 DIAGNOSIS — Z923 Personal history of irradiation: Secondary | ICD-10-CM | POA: Diagnosis not present

## 2022-09-01 DIAGNOSIS — R5383 Other fatigue: Secondary | ICD-10-CM | POA: Diagnosis not present

## 2022-09-01 DIAGNOSIS — C773 Secondary and unspecified malignant neoplasm of axilla and upper limb lymph nodes: Secondary | ICD-10-CM | POA: Diagnosis not present

## 2022-09-01 LAB — CMP (CANCER CENTER ONLY)
ALT: 18 U/L (ref 0–44)
AST: 24 U/L (ref 15–41)
Albumin: 3.7 g/dL (ref 3.5–5.0)
Alkaline Phosphatase: 67 U/L (ref 38–126)
Anion gap: 8 (ref 5–15)
BUN: 11 mg/dL (ref 8–23)
CO2: 24 mmol/L (ref 22–32)
Calcium: 8.9 mg/dL (ref 8.9–10.3)
Chloride: 97 mmol/L — ABNORMAL LOW (ref 98–111)
Creatinine: 0.41 mg/dL — ABNORMAL LOW (ref 0.44–1.00)
GFR, Estimated: >60 mL/min (ref >60)
Glucose, Bld: 92 mg/dL (ref 70–99)
Potassium: 4.1 mmol/L (ref 3.5–5.1)
Sodium: 129 mmol/L — ABNORMAL LOW (ref 135–145)
Total Bilirubin: 0.5 mg/dL (ref 0.3–1.2)
Total Protein: 6.2 g/dL — ABNORMAL LOW (ref 6.5–8.1)

## 2022-09-01 LAB — CBC WITH DIFFERENTIAL (CANCER CENTER ONLY)
Abs Immature Granulocytes: 0.02 10*3/uL (ref 0.00–0.07)
Basophils Absolute: 0.1 10*3/uL (ref 0.0–0.1)
Basophils Relative: 1 %
Eosinophils Absolute: 0 10*3/uL (ref 0.0–0.5)
Eosinophils Relative: 1 %
HCT: 32.2 % — ABNORMAL LOW (ref 36.0–46.0)
Hemoglobin: 11.2 g/dL — ABNORMAL LOW (ref 12.0–15.0)
Immature Granulocytes: 1 %
Lymphocytes Relative: 30 %
Lymphs Abs: 1.3 10*3/uL (ref 0.7–4.0)
MCH: 30.2 pg (ref 26.0–34.0)
MCHC: 34.8 g/dL (ref 30.0–36.0)
MCV: 86.8 fL (ref 80.0–100.0)
Monocytes Absolute: 0.7 10*3/uL (ref 0.1–1.0)
Monocytes Relative: 16 %
Neutro Abs: 2.3 10*3/uL (ref 1.7–7.7)
Neutrophils Relative %: 51 %
Platelet Count: 221 10*3/uL (ref 150–400)
RBC: 3.71 MIL/uL — ABNORMAL LOW (ref 3.87–5.11)
RDW: 14.2 % (ref 11.5–15.5)
WBC Count: 4.4 10*3/uL (ref 4.0–10.5)
nRBC: 0 % (ref 0.0–0.2)

## 2022-09-01 LAB — RAD ONC ARIA SESSION SUMMARY
Course Elapsed Days: 39
Plan Fractions Treated to Date: 26
Plan Prescribed Dose Per Fraction: 2 Gy
Plan Total Fractions Prescribed: 30
Plan Total Prescribed Dose: 60 Gy
Reference Point Dosage Given to Date: 52 Gy
Reference Point Session Dosage Given: 2 Gy
Session Number: 26

## 2022-09-01 MED ORDER — DIPHENHYDRAMINE HCL 50 MG/ML IJ SOLN
50.0000 mg | Freq: Once | INTRAMUSCULAR | Status: AC
  Administered 2022-09-01: 50 mg via INTRAVENOUS
  Filled 2022-09-01: qty 1

## 2022-09-01 MED ORDER — PALONOSETRON HCL INJECTION 0.25 MG/5ML
0.2500 mg | Freq: Once | INTRAVENOUS | Status: AC
  Administered 2022-09-01: 0.25 mg via INTRAVENOUS
  Filled 2022-09-01: qty 5

## 2022-09-01 MED ORDER — SODIUM CHLORIDE 0.9 % IV SOLN: Freq: Once | INTRAVENOUS | Status: AC

## 2022-09-01 MED ORDER — SODIUM CHLORIDE 0.9 % IV SOLN
45.0000 mg/m2 | Freq: Once | INTRAVENOUS | Status: AC
  Administered 2022-09-01: 60 mg via INTRAVENOUS
  Filled 2022-09-01: qty 10

## 2022-09-01 MED ORDER — FAMOTIDINE IN NACL 20-0.9 MG/50ML-% IV SOLN
20.0000 mg | Freq: Once | INTRAVENOUS | Status: AC
  Administered 2022-09-01: 20 mg via INTRAVENOUS
  Filled 2022-09-01: qty 50

## 2022-09-01 MED ORDER — SODIUM CHLORIDE 0.9 % IV SOLN
10.0000 mg | Freq: Once | INTRAVENOUS | Status: AC
  Administered 2022-09-01: 10 mg via INTRAVENOUS
  Filled 2022-09-01: qty 10

## 2022-09-01 MED ORDER — SODIUM CHLORIDE 0.9 % IV SOLN
113.0000 mg | Freq: Once | INTRAVENOUS | Status: AC
  Administered 2022-09-01: 110 mg via INTRAVENOUS
  Filled 2022-09-01: qty 11

## 2022-09-01 NOTE — Progress Notes (Signed)
[facility] Telephone:([phone]) [phone]   Fax:(336) [phone]  OFFICE PROGRESS NOTE  Barbara Skeans, MD [address] [place] [address]  DIAGNOSIS: Stage IIb/IIIa (T2b, N1-2, M0) non-small cell lung cancer, squamous cell carcinoma presented with large left lower lobe lung mass in addition to left hilar and suspicious mediastinal lymphadenopathy diagnosed in May 2024  PRIOR THERAPY: None  CURRENT THERAPY: A course of concurrent chemoradiation with weekly carboplatin for AUC of 2 and paclitaxel 45 Mg/M2.  First dose July 21, 2022.  Status post 6 cycles.  INTERVAL HISTORY: Barbara Green 82 y.o. female returns to the clinic today for follow-up visit.  The patient is feeling fine today with no concerning complaints except for the mild fatigue that started 2 days after her treatment.  She denied having any current chest pain, shortness of breath, cough or hemoptysis.  She has no nausea, vomiting, diarrhea or constipation.  She has no headache or visual changes.  She has no recent weight loss or night sweats.  She has been tolerating her treatment with concurrent chemoradiation fairly well she is expected to complete the course of her treatment this week.  MEDICAL HISTORY: Past Medical History:  Diagnosis Date   History of radiation therapy 06/29/19-07/29/19   Right breast IMRT- Dr. Roselind Messier   Hypertension     ALLERGIES:  has No Known Allergies.  MEDICATIONS:  Current Outpatient Medications  Medication Sig Dispense Refill   BONINE 25 MG CHEW Chew 25 mg by mouth daily as needed (for vertigo).     demeclocycline (DECLOMYCIN) 150 MG tablet Take 1 tablet (150 mg total) by mouth 2 (two) times daily. 60 tablet 0   lisinopril (ZESTRIL) 5 MG tablet Take 1 tablet (5 mg total) by mouth daily. 90 tablet 1   nicotine (NICODERM CQ - DOSED IN MG/24 HOURS) 14 mg/24hr patch Place 1 patch (14 mg total) onto the skin daily. 30 patch 0   prochlorperazine (COMPAZINE) 10 MG tablet  Take 1 tablet (10 mg total) by mouth every 6 (six) hours as needed. (Patient not taking: Reported on 07/16/2022) 30 tablet 2   sucralfate (CARAFATE) 1 g tablet Take 1 tablet (1 g total) by mouth 4 (four) times daily -  with meals and at bedtime. Crush and dissolve in 10 mL's of warm water prior to swallowing, take 20 minutes prior to meals 120 tablet 1   UNKNOWN TO PATIENT Take 1 tablet by mouth See admin instructions. Unnamed allergy tablet- Take 1 tablet by mouth once a day as needed for seasonal allergies     No current facility-administered medications for this visit.    SURGICAL HISTORY:  Past Surgical History:  Procedure Laterality Date   BREAST LUMPECTOMY WITH SENTINEL LYMPH NODE BIOPSY Right 05/17/2019   Procedure: RIGHT BREAST LUMPECTOMY WITH SENTINEL LYMPH NODE BIOPSY;  Surgeon: Abigail Miyamoto, MD;  Location: [facility];  Service: General;  Laterality: Right;   BRONCHIAL BIOPSY  06/12/2022   Procedure: BRONCHIAL BIOPSIES;  Surgeon: Omar Person, MD;  Location: [facility];  Service: Pulmonary;;   BRONCHIAL NEEDLE ASPIRATION BIOPSY  06/12/2022   Procedure: BRONCHIAL NEEDLE ASPIRATION BIOPSIES;  Surgeon: Omar Person, MD;  Location: [facility] ENDOSCOPY;  Service: Pulmonary;;   FIDUCIAL MARKER PLACEMENT  06/12/2022   Procedure: FIDUCIAL MARKER PLACEMENT;  Surgeon: Omar Person, MD;  Location: [facility] ENDOSCOPY;  Service: Pulmonary;;   NO PAST SURGERIES     VIDEO BRONCHOSCOPY WITH ENDOBRONCHIAL ULTRASOUND N/A 06/12/2022  Procedure: VIDEO BRONCHOSCOPY WITH ENDOBRONCHIAL ULTRASOUND;  Surgeon: Omar Person, MD;  Location: [facility] ENDOSCOPY;  Service: Pulmonary;  Laterality: N/A;    REVIEW OF SYSTEMS:  A comprehensive review of systems was negative except for: Constitutional: positive for fatigue   PHYSICAL EXAMINATION: General appearance: alert, cooperative, fatigued, and no distress Head: Normocephalic, without obvious abnormality, atraumatic Neck: no adenopathy,  no JVD, supple, symmetrical, trachea midline, and thyroid not enlarged, symmetric, no tenderness/mass/nodules Lymph nodes: Cervical, supraclavicular, and axillary nodes normal. Resp: clear to auscultation bilaterally Back: symmetric, no curvature. ROM normal. No CVA tenderness. Cardio: regular rate and rhythm, S1, S2 normal, no murmur, click, rub or gallop GI: soft, non-tender; bowel sounds normal; no masses,  no organomegaly Extremities: extremities normal, atraumatic, no cyanosis or edema  ECOG PERFORMANCE STATUS: 1 - Symptomatic but completely ambulatory  Blood pressure (!) 100/56, pulse 100, temperature (!) 97.5 F (36.4 C), temperature source Oral, resp. rate 16, height 5' (1.524 m), weight 96 lb 12.8 oz (43.9 kg), SpO2 97%.  LABORATORY DATA: Lab Results  Component Value Date   WBC 4.4 09/01/2022   HGB 11.2 (L) 09/01/2022   HCT 32.2 (L) 09/01/2022   MCV 86.8 09/01/2022   PLT 221 09/01/2022      Chemistry      Component Value Date/Time   NA 129 (L) 09/01/2022 1018   NA 124 (L) 05/26/2022 1323   K 4.1 09/01/2022 1018   CL 97 (L) 09/01/2022 1018   CO2 24 09/01/2022 1018   BUN 11 09/01/2022 1018   BUN 7 (L) 05/26/2022 1323   CREATININE 0.41 (L) 09/01/2022 1018      Component Value Date/Time   CALCIUM 8.9 09/01/2022 1018   ALKPHOS 67 09/01/2022 1018   AST 24 09/01/2022 1018   ALT 18 09/01/2022 1018   BILITOT 0.5 09/01/2022 1018       RADIOGRAPHIC STUDIES: No results found.  ASSESSMENT AND PLAN: This is a very pleasant 82 years old white female recently diagnosed with a stage IIb (T2b, N1, M0) non-small cell lung cancer, squamous cell carcinoma presented with left lower lobe lung mass in addition to ipsilateral infrahilar nodal metastasis diagnosed in June 2024. She is not a good surgical candidate for resection. She is currently undergoing a course of concurrent chemoradiation with weekly carboplatin for AUC of 2 and paclitaxel 45 Mg/M2.  Status post 6 cycles.  The  patient has been tolerating this treatment well with no concerning adverse effects except for mild fatigue. I recommended for her to proceed with cycle #7 today as planned.  She is expected to complete this course of concurrent chemoradiation this week. I will see her back for follow-up visit in 1 months with repeat CT scan of the chest for restaging of her disease. For the hyponatremia, she will continue her treatment with demeclocycline. The patient was advised to call immediately if she has any other concerning symptoms in the interval. The patient voices understanding of current disease status and treatment options and is in agreement with the current care plan.  All questions were answered. The patient knows to call the clinic with any problems, questions or concerns. We can certainly see the patient much sooner if necessary.  The total time spent in the appointment was 20 minutes.  Disclaimer: This note was dictated with voice recognition software. Similar sounding words can inadvertently be transcribed and may not be corrected upon review.

## 2022-09-01 NOTE — Progress Notes (Signed)
Nutrition Follow-up:  Patient with newly diagnosed stage III non-small cell lung cancer.  Followed by Dr Shirline Frees and Roselind Messier.  Patient receiving chemotherapy and radiation.  Last chemo today and last radiation on 8/9  Met with patient during infusion.  Reports that she was able to eat chicken, green beans and macaroni and cheese and drank a boost shake.  Usually drinks 2 boost a day.  Denies nausea.  Happy that this is her last chemo.     Medications: reviewed  Labs: Na 129  Anthropometrics:   Weight 96 lb 12.8 oz   99 lb 8 oz on 7/15 100 lb 12.8 oz on 6/24 97 lb 12.8 oz on 6/10 UBW of 110-115 lb    NUTRITION DIAGNOSIS: Food and nutrition related knowledge deficit improved    INTERVENTION:  Encouraged well balanced diet after treatment as well to aide in recovery Continue boost shakes BID    MONITORING, EVALUATION, GOAL: weight trends, intake   NEXT VISIT: no follow-up planned RD available if needed   B. Freida Busman, RD, LDN Registered Dietitian [phone]

## 2022-09-01 NOTE — Progress Notes (Signed)
OK to treat with BP 100/56 per Dr Arbutus Ped

## 2022-09-01 NOTE — Patient Instructions (Signed)
[facility]  Discharge Instructions: Thank you for choosing [facility] to provide your oncology and hematology care.   If you have a lab appointment with the Cancer Center, please go directly to the Cancer Center and check in at the registration area.   Wear comfortable clothing and clothing appropriate for easy access to any Portacath or PICC line.   We strive to give you quality time with your provider. You may need to reschedule your appointment if you arrive late (15 or more minutes).  Arriving late affects you and other patients whose appointments are after yours.  Also, if you miss three or more appointments without notifying the office, you may be dismissed from the clinic at the provider's discretion.      For prescription refill requests, have your pharmacy contact our office and allow 72 hours for refills to be completed.    Today you received the following chemotherapy and/or immunotherapy agents: Paclitaxel, Carboplatin.       To help prevent nausea and vomiting after your treatment, we encourage you to take your nausea medication as directed.  BELOW ARE SYMPTOMS THAT SHOULD BE REPORTED IMMEDIATELY: *FEVER GREATER THAN 100.4 F (38 C) OR HIGHER *CHILLS OR SWEATING *NAUSEA AND VOMITING THAT IS NOT CONTROLLED WITH YOUR NAUSEA MEDICATION *UNUSUAL SHORTNESS OF BREATH *UNUSUAL BRUISING OR BLEEDING *URINARY PROBLEMS (pain or burning when urinating, or frequent urination) *BOWEL PROBLEMS (unusual diarrhea, constipation, pain near the anus) TENDERNESS IN MOUTH AND THROAT WITH OR WITHOUT PRESENCE OF ULCERS (sore throat, sores in mouth, or a toothache) UNUSUAL RASH, SWELLING OR PAIN  UNUSUAL VAGINAL DISCHARGE OR ITCHING   Items with * indicate a potential emergency and should be followed up as soon as possible or go to the Emergency Department if any problems should occur.  Please show the CHEMOTHERAPY ALERT CARD or IMMUNOTHERAPY  ALERT CARD at check-in to the Emergency Department and triage nurse.  Should you have questions after your visit or need to cancel or reschedule your appointment, please contact [facility]  Dept: [phone]  and follow the prompts.  Office hours are 8:00 a.m. to 4:30 p.m. Monday - Friday. Please note that voicemails left after 4:00 p.m. may not be returned until the following business day.  We are closed weekends and major holidays. You have access to a nurse at all times for urgent questions. Please call the main number to the clinic Dept: [phone] and follow the prompts.   For any non-urgent questions, you may also contact your provider using MyChart. We now offer e-Visits for anyone 18 and older to request care online for non-urgent symptoms. For details visit [email or web address].   Also download the MyChart app! Go to the app store, search "MyChart", open the app, select [place], and log in with your MyChart username and password.   

## 2022-09-02 ENCOUNTER — Ambulatory Visit
Admission: RE | Admit: 2022-09-02 | Discharge: 2022-09-02 | Disposition: A | Discharge status: Home / Self Care | Payer: Medicare HMO | Source: Ambulatory Visit | Attending: Radiation Oncology | Admitting: Radiation Oncology

## 2022-09-02 ENCOUNTER — Other Ambulatory Visit: Payer: Self-pay

## 2022-09-02 DIAGNOSIS — C3492 Malignant neoplasm of unspecified part of left bronchus or lung: Secondary | ICD-10-CM | POA: Diagnosis not present

## 2022-09-02 LAB — RAD ONC ARIA SESSION SUMMARY
Course Elapsed Days: 40
Plan Fractions Treated to Date: 27
Plan Prescribed Dose Per Fraction: 2 Gy
Plan Total Fractions Prescribed: 30
Plan Total Prescribed Dose: 60 Gy
Reference Point Dosage Given to Date: 54 Gy
Reference Point Session Dosage Given: 2 Gy
Session Number: 27

## 2022-09-03 ENCOUNTER — Other Ambulatory Visit: Payer: Self-pay

## 2022-09-03 ENCOUNTER — Ambulatory Visit
Admission: RE | Admit: 2022-09-03 | Discharge: 2022-09-03 | Disposition: A | Discharge status: Home / Self Care | Payer: Medicare HMO | Source: Ambulatory Visit | Attending: Radiation Oncology | Admitting: Radiation Oncology

## 2022-09-03 DIAGNOSIS — C3492 Malignant neoplasm of unspecified part of left bronchus or lung: Secondary | ICD-10-CM | POA: Diagnosis not present

## 2022-09-03 LAB — RAD ONC ARIA SESSION SUMMARY
Course Elapsed Days: 41
Plan Fractions Treated to Date: 28
Plan Prescribed Dose Per Fraction: 2 Gy
Plan Total Fractions Prescribed: 30
Plan Total Prescribed Dose: 60 Gy
Reference Point Dosage Given to Date: 56 Gy
Reference Point Session Dosage Given: 2 Gy
Session Number: 28

## 2022-09-04 ENCOUNTER — Ambulatory Visit: Admission: RE | Admit: 2022-09-04 | Payer: Medicare HMO | Source: Ambulatory Visit

## 2022-09-04 ENCOUNTER — Ambulatory Visit: Payer: Medicare HMO

## 2022-09-05 ENCOUNTER — Other Ambulatory Visit: Payer: Self-pay

## 2022-09-05 ENCOUNTER — Ambulatory Visit
Admission: RE | Admit: 2022-09-05 | Discharge: 2022-09-05 | Disposition: A | Discharge status: Home / Self Care | Payer: Medicare HMO | Source: Ambulatory Visit | Attending: Radiation Oncology | Admitting: Radiation Oncology

## 2022-09-05 ENCOUNTER — Ambulatory Visit: Payer: Medicare HMO

## 2022-09-05 DIAGNOSIS — C3492 Malignant neoplasm of unspecified part of left bronchus or lung: Secondary | ICD-10-CM | POA: Diagnosis not present

## 2022-09-05 LAB — RAD ONC ARIA SESSION SUMMARY
Course Elapsed Days: 43
Plan Fractions Treated to Date: 29
Plan Prescribed Dose Per Fraction: 2 Gy
Plan Total Fractions Prescribed: 30
Plan Total Prescribed Dose: 60 Gy
Reference Point Dosage Given to Date: 58 Gy
Reference Point Session Dosage Given: 2 Gy
Session Number: 29

## 2022-09-05 MED ORDER — SONAFINE EX EMUL
1.0000 | Freq: Once | CUTANEOUS | Status: AC
  Administered 2022-09-05: 1 via TOPICAL

## 2022-09-06 ENCOUNTER — Telehealth: Payer: Self-pay | Admitting: Internal Medicine

## 2022-09-06 NOTE — Telephone Encounter (Signed)
Scheduled per 08/05 los, patient has been called and notified.

## 2022-09-08 ENCOUNTER — Ambulatory Visit
Admission: RE | Admit: 2022-09-08 | Discharge: 2022-09-08 | Disposition: A | Discharge status: Home / Self Care | Payer: Medicare HMO | Source: Ambulatory Visit | Attending: Radiation Oncology | Admitting: Radiation Oncology

## 2022-09-08 ENCOUNTER — Other Ambulatory Visit: Payer: Self-pay | Admitting: Physician Assistant

## 2022-09-08 ENCOUNTER — Other Ambulatory Visit: Payer: Self-pay

## 2022-09-08 DIAGNOSIS — C3492 Malignant neoplasm of unspecified part of left bronchus or lung: Secondary | ICD-10-CM

## 2022-09-08 DIAGNOSIS — E871 Hypo-osmolality and hyponatremia: Secondary | ICD-10-CM

## 2022-09-08 LAB — RAD ONC ARIA SESSION SUMMARY
Course Elapsed Days: 46
Plan Fractions Treated to Date: 30
Plan Prescribed Dose Per Fraction: 2 Gy
Plan Total Fractions Prescribed: 30
Plan Total Prescribed Dose: 60 Gy
Reference Point Dosage Given to Date: 60 Gy
Reference Point Session Dosage Given: 2 Gy
Session Number: 30

## 2022-09-09 NOTE — Radiation Completion Notes (Signed)
Patient Name: Barbara Green, Barbara Green MRN: [id number] Date of Birth: 01-21-1941 Referring Physician: Felisa Bonier, M.D. Date of Service: 2022-09-09 Radiation Oncologist: Arnette Schaumann, M.D. [facility]                             RADIATION ONCOLOGY END OF TREATMENT NOTE     Diagnosis: C34.32 Malignant neoplasm of lower lobe, left bronchus or lung Staging on 2019-05-17: Malignant neoplasm of upper-inner quadrant of right breast in female, estrogen receptor positive (HCC) T=pT1c, N=pN0, M=cM0 Staging on 2019-04-13: Malignant neoplasm of upper-inner quadrant of right breast in female, estrogen receptor positive (HCC) T=cT1c, N=cN0, M=cM0 Staging on 2022-07-07: Stage III squamous cell carcinoma of left lung (HCC) T=cT2b, N=cN2, M=cM0 Intent: Curative     ==========DELIVERED PLANS==========  First Treatment Date: 2022-07-24 - Last Treatment Date: 2022-09-08   Plan Name: Lung_L Site: Lung, Left Technique: 3D Mode: Photon Dose Per Fraction: 2 Gy Prescribed Dose (Delivered / Prescribed): 60 Gy / 60 Gy Prescribed Fxs (Delivered / Prescribed): 30 / 30     ==========ON TREATMENT VISIT DATES========== 2022-07-29, 2022-08-05, 2022-08-12, 2022-08-19, 2022-08-26, 2022-09-02     ==========UPCOMING VISITS==========       ==========APPENDIX - ON TREATMENT VISIT NOTES==========   See weekly On Treatment Notes in Epic for details.

## 2022-09-25 ENCOUNTER — Inpatient Hospital Stay: Payer: Medicare HMO

## 2022-09-25 ENCOUNTER — Ambulatory Visit (HOSPITAL_COMMUNITY)
Admission: RE | Admit: 2022-09-25 | Discharge: 2022-09-25 | Disposition: A | Discharge status: Home / Self Care | Payer: Medicare HMO | Source: Ambulatory Visit | Attending: Internal Medicine | Admitting: Internal Medicine

## 2022-09-25 DIAGNOSIS — C3490 Malignant neoplasm of unspecified part of unspecified bronchus or lung: Secondary | ICD-10-CM | POA: Insufficient documentation

## 2022-09-25 DIAGNOSIS — C3492 Malignant neoplasm of unspecified part of left bronchus or lung: Secondary | ICD-10-CM

## 2022-09-25 LAB — CBC WITH DIFFERENTIAL (CANCER CENTER ONLY)
Abs Immature Granulocytes: 0.04 10*3/uL (ref 0.00–0.07)
Basophils Absolute: 0.1 10*3/uL (ref 0.0–0.1)
Basophils Relative: 1 %
Eosinophils Absolute: 0.2 10*3/uL (ref 0.0–0.5)
Eosinophils Relative: 3 %
HCT: 31.5 % — ABNORMAL LOW (ref 36.0–46.0)
Hemoglobin: 10.8 g/dL — ABNORMAL LOW (ref 12.0–15.0)
Immature Granulocytes: 1 %
Lymphocytes Relative: 21 %
Lymphs Abs: 1.4 10*3/uL (ref 0.7–4.0)
MCH: 30 pg (ref 26.0–34.0)
MCHC: 34.3 g/dL (ref 30.0–36.0)
MCV: 87.5 fL (ref 80.0–100.0)
Monocytes Absolute: 1.6 10*3/uL — ABNORMAL HIGH (ref 0.1–1.0)
Monocytes Relative: 23 %
Neutro Abs: 3.6 10*3/uL (ref 1.7–7.7)
Neutrophils Relative %: 51 %
Platelet Count: 318 10*3/uL (ref 150–400)
RBC: 3.6 MIL/uL — ABNORMAL LOW (ref 3.87–5.11)
RDW: 15.4 % (ref 11.5–15.5)
WBC Count: 6.9 10*3/uL (ref 4.0–10.5)
nRBC: 0 % (ref 0.0–0.2)

## 2022-09-25 LAB — CMP (CANCER CENTER ONLY)
ALT: 12 U/L (ref 0–44)
AST: 17 U/L (ref 15–41)
Albumin: 3.4 g/dL — ABNORMAL LOW (ref 3.5–5.0)
Alkaline Phosphatase: 77 U/L (ref 38–126)
Anion gap: 6 (ref 5–15)
BUN: 7 mg/dL — ABNORMAL LOW (ref 8–23)
CO2: 29 mmol/L (ref 22–32)
Calcium: 8.8 mg/dL — ABNORMAL LOW (ref 8.9–10.3)
Chloride: 94 mmol/L — ABNORMAL LOW (ref 98–111)
Creatinine: 0.39 mg/dL — ABNORMAL LOW (ref 0.44–1.00)
GFR, Estimated: >60 mL/min (ref >60)
Glucose, Bld: 98 mg/dL (ref 70–99)
Potassium: 3.8 mmol/L (ref 3.5–5.1)
Sodium: 129 mmol/L — ABNORMAL LOW (ref 135–145)
Total Bilirubin: 0.5 mg/dL (ref 0.3–1.2)
Total Protein: 6 g/dL — ABNORMAL LOW (ref 6.5–8.1)

## 2022-09-25 MED ORDER — SODIUM CHLORIDE (PF) 0.9 % IJ SOLN
INTRAMUSCULAR | Status: AC
  Filled 2022-09-25: qty 50

## 2022-09-25 MED ORDER — IOHEXOL 300 MG/ML  SOLN
75.0000 mL | Freq: Once | INTRAMUSCULAR | Status: AC | PRN
  Administered 2022-09-25: 75 mL via INTRAVENOUS

## 2022-09-27 NOTE — Progress Notes (Deleted)
[facility] OFFICE PROGRESS NOTE  Barbara Skeans, MD [address] [place] [address]  DIAGNOSIS: Stage IIb/IIIa (T2b, N1-2, M0) non-small cell lung cancer, squamous cell carcinoma presented with large left lower lobe lung mass in addition to left hilar and suspicious mediastinal lymphadenopathy diagnosed in May 2024   PRIOR THERAPY: A course of concurrent chemoradiation with weekly carboplatin for AUC of 2 and paclitaxel 45 Mg/M2.  First dose July 21, 2022.  Status post 6 cycles.   CURRENT THERAPY: Consolidation immunotherapy with Imfinzi 1500 mg IV every 4 weeks.  First dose expected next week on 9/ ***/ ******* ****first observation since she is technically stage IIb versus 3  INTERVAL HISTORY: Barbara Green 82 y.o. female returns to clinic today for follow-up visit accompanied by***.  The patient was last seen by Dr. Arbutus Ped a few weeks ago.  The patient completed course concurrent chemoradiation.  She tolerated ***without any major adverse side effects.  She denies any fever, chills, night sweats, or unexplained weight loss.  She sometimes has dyspnea on exertion which is ***.  Cough at nighttime?  Denies any chest pain or hemoptysis.  Denies any nausea, vomiting, diarrhea, or constipation.  Denies any headache or visual changes.  She recently had a restaging CT scan performed.  She is here today for evaluation to review her scan results and discuss the next steps in her care.   MEDICAL HISTORY: Past Medical History:  Diagnosis Date   History of radiation therapy 06/29/19-07/29/19   Right breast IMRT- Dr. Roselind Messier   Hypertension     ALLERGIES:  has No Known Allergies.  MEDICATIONS:  Current Outpatient Medications  Medication Sig Dispense Refill   BONINE 25 MG CHEW Chew 25 mg by mouth daily as needed (for vertigo).     demeclocycline (DECLOMYCIN) 150 MG tablet TAKE 1 TABLET BY MOUTH 2 TIMES DAILY. 60 tablet 0   lisinopril (ZESTRIL) 5 MG tablet Take 1  tablet (5 mg total) by mouth daily. 90 tablet 1   nicotine (NICODERM CQ - DOSED IN MG/24 HOURS) 14 mg/24hr patch Place 1 patch (14 mg total) onto the skin daily. 30 patch 0   prochlorperazine (COMPAZINE) 10 MG tablet Take 1 tablet (10 mg total) by mouth every 6 (six) hours as needed. (Patient not taking: Reported on 07/16/2022) 30 tablet 2   sucralfate (CARAFATE) 1 g tablet Take 1 tablet (1 g total) by mouth 4 (four) times daily -  with meals and at bedtime. Crush and dissolve in 10 mL's of warm water prior to swallowing, take 20 minutes prior to meals 120 tablet 1   UNKNOWN TO PATIENT Take 1 tablet by mouth See admin instructions. Unnamed allergy tablet- Take 1 tablet by mouth once a day as needed for seasonal allergies     No current facility-administered medications for this visit.    SURGICAL HISTORY:  Past Surgical History:  Procedure Laterality Date   BREAST LUMPECTOMY WITH SENTINEL LYMPH NODE BIOPSY Right 05/17/2019   Procedure: RIGHT BREAST LUMPECTOMY WITH SENTINEL LYMPH NODE BIOPSY;  Surgeon: Abigail Miyamoto, MD;  Location: [facility];  Service: General;  Laterality: Right;   BRONCHIAL BIOPSY  06/12/2022   Procedure: BRONCHIAL BIOPSIES;  Surgeon: Omar Person, MD;  Location: [facility] ENDOSCOPY;  Service: Pulmonary;;   BRONCHIAL NEEDLE ASPIRATION BIOPSY  06/12/2022   Procedure: BRONCHIAL NEEDLE ASPIRATION BIOPSIES;  Surgeon: Omar Person, MD;  Location: [facility] ENDOSCOPY;  Service: Pulmonary;;   FIDUCIAL MARKER PLACEMENT  06/12/2022   Procedure: FIDUCIAL MARKER PLACEMENT;  Surgeon: Omar Person, MD;  Location: [facility] ENDOSCOPY;  Service: Pulmonary;;   NO PAST SURGERIES     VIDEO BRONCHOSCOPY WITH ENDOBRONCHIAL ULTRASOUND N/A 06/12/2022   Procedure: VIDEO BRONCHOSCOPY WITH ENDOBRONCHIAL ULTRASOUND;  Surgeon: Omar Person, MD;  Location[facility]COPY;  Service: Pulmonary;  Laterality: N/A;    REVIEW OF SYSTEMS:   Review of Systems  Constitutional: Negative for  appetite change, chills, fatigue, fever and unexpected weight change.  HENT:   Negative for mouth sores, nosebleeds, sore throat and trouble swallowing.   Eyes: Negative for eye problems and icterus.  Respiratory: Negative for cough, hemoptysis, shortness of breath and wheezing.   Cardiovascular: Negative for chest pain and leg swelling.  Gastrointestinal: Negative for abdominal pain, constipation, diarrhea, nausea and vomiting.  Genitourinary: Negative for bladder incontinence, difficulty urinating, dysuria, frequency and hematuria.   Musculoskeletal: Negative for back pain, gait problem, neck pain and neck stiffness.  Skin: Negative for itching and rash.  Neurological: Negative for dizziness, extremity weakness, gait problem, headaches, light-headedness and seizures.  Hematological: Negative for adenopathy. Does not bruise/bleed easily.  Psychiatric/Behavioral: Negative for confusion, depression and sleep disturbance. The patient is not nervous/anxious.     PHYSICAL EXAMINATION:  There were no vitals taken for this visit.  ECOG PERFORMANCE STATUS: {CHL ONC ECOG [id number]  Physical Exam  Constitutional: Oriented to person, place, and time and well-developed, well-nourished, and in no distress. No distress.  HENT:  Head: Normocephalic and atraumatic.  Mouth/Throat: Oropharynx is clear and moist. No oropharyngeal exudate.  Eyes: Conjunctivae are normal. Right eye exhibits no discharge. Left eye exhibits no discharge. No scleral icterus.  Neck: Normal range of motion. Neck supple.  Cardiovascular: Normal rate, regular rhythm, normal heart sounds and intact distal pulses.   Pulmonary/Chest: Effort normal and breath sounds normal. No respiratory distress. No wheezes. No rales.  Abdominal: Soft. Bowel sounds are normal. Exhibits no distension and no mass. There is no tenderness.  Musculoskeletal: Normal range of motion. Exhibits no edema.  Lymphadenopathy:    No cervical adenopathy.   Neurological: Alert and oriented to person, place, and time. Exhibits normal muscle tone. Gait normal. Coordination normal.  Skin: Skin is warm and dry. No rash noted. Not diaphoretic. No erythema. No pallor.  Psychiatric: Mood, memory and judgment normal.  Vitals reviewed.  LABORATORY DATA: Lab Results  Component Value Date   WBC 6.9 09/25/2022   HGB 10.8 (L) 09/25/2022   HCT 31.5 (L) 09/25/2022   MCV 87.5 09/25/2022   PLT 318 09/25/2022      Chemistry      Component Value Date/Time   NA 129 (L) 09/25/2022 1102   NA 124 (L) 05/26/2022 1323   K 3.8 09/25/2022 1102   CL 94 (L) 09/25/2022 1102   CO2 29 09/25/2022 1102   BUN 7 (L) 09/25/2022 1102   BUN 7 (L) 05/26/2022 1323   CREATININE 0.39 (L) 09/25/2022 1102      Component Value Date/Time   CALCIUM 8.8 (L) 09/25/2022 1102   ALKPHOS 77 09/25/2022 1102   AST 17 09/25/2022 1102   ALT 12 09/25/2022 1102   BILITOT 0.5 09/25/2022 1102       RADIOGRAPHIC STUDIES:  No results found.   ASSESSMENT/PLAN:  This is a very pleasant 82 year old Caucasian female diagnosed with stage IIb (T2a, M1) non-small cell lung cancer, squamous cell carcinoma.  She presented with a left lower lobe lung mass in addition to ipsilateral infrahilar nodal metastasis.  She was diagnosed in June 2024.  She was not a good candidate for surgical resection.  She completed a course of concurrent chemoradiation with carboplatin for AUC of 2 and paclitaxel 45 mg/m.  She is status post 6 cycles.  The patient recently had a CT scan performed.  The patient was seen with Dr. Arbutus Ped today.  Dr. Arbutus Ped personally and independently reviewed her scan results and discussed the results with the patient today.  The scan showed***  Dr. Arbutus Ped discussed observation versus consolidation immunotherapy with Imfinzi 15 to milligrams IV every 4 weeks.  The patient is interested in this option and she is expected to undergo her first cycle of treatment on ***   I  discussed with her the adverse effect of the immunotherapy including but not limited to immunotherapy mediated skin rash, diarrhea, inflammation of the lung, kidney, liver, thyroid or other endocrine dysfunction  We will see her back for follow-up visit in 4 weeks for evaluation repeat blood work before undergoing cycle #5.  The patient was advised to call immediately if she has any concerning symptoms in the interval. The patient voices understanding of current disease status and treatment options and is in agreement with the current care plan. All questions were answered. The patient knows to call the clinic with any problems, questions or concerns. We can certainly see the patient much sooner if necessary   No orders of the defined types were placed in this encounter.    I spent {CHL ONC TIME VISIT - ZOXWR:[id number]} counseling the patient face to face. The total time spent in the appointment was {CHL ONC TIME VISIT - BJYNW:[id number]}.  Alfreda Hammad L Deah Ottaway, PA-C 09/27/22

## 2022-09-30 ENCOUNTER — Encounter: Payer: Self-pay | Admitting: Internal Medicine

## 2022-09-30 ENCOUNTER — Telehealth: Payer: Self-pay

## 2022-09-30 NOTE — Telephone Encounter (Signed)
Patients daughter Harriett Sine called in to reports increased weakness, and shortness of breath. Patient completed 30 treatments to left lung on 09/08/22. Daughter also reports decreased appetite. Per daughter patients hemoglobin is 10.8 HCT 31.5. Patient to see Dr. Shirline Frees on Thursday 10/02/22. Daughter requesting something to help patient with her energy level. Pls advise.

## 2022-10-01 ENCOUNTER — Encounter: Payer: Self-pay | Admitting: Internal Medicine

## 2022-10-01 ENCOUNTER — Inpatient Hospital Stay: Payer: Medicare HMO | Attending: Physician Assistant | Admitting: Internal Medicine

## 2022-10-01 VITALS — BP 104/67 | HR 110 | Temp 97.7°F | Resp 15 | Ht 60.0 in | Wt 95.0 lb

## 2022-10-01 DIAGNOSIS — C3432 Malignant neoplasm of lower lobe, left bronchus or lung: Secondary | ICD-10-CM | POA: Diagnosis present

## 2022-10-01 DIAGNOSIS — C3492 Malignant neoplasm of unspecified part of left bronchus or lung: Secondary | ICD-10-CM

## 2022-10-01 DIAGNOSIS — Z5111 Encounter for antineoplastic chemotherapy: Secondary | ICD-10-CM | POA: Diagnosis present

## 2022-10-01 DIAGNOSIS — E871 Hypo-osmolality and hyponatremia: Secondary | ICD-10-CM | POA: Insufficient documentation

## 2022-10-01 DIAGNOSIS — Z79899 Other long term (current) drug therapy: Secondary | ICD-10-CM | POA: Insufficient documentation

## 2022-10-01 NOTE — Progress Notes (Signed)
[facility] [phone]   Fax:([phone]  OFFICE PROGRESS NOTE  [name], MD [address] [place] [address]  DIAGNOSIS: Stage IIIA (T2b, N2, M0) non-small cell lung cancer, squamous cell carcinoma presented with large left lower lobe lung mass in addition to left hilar and suspicious mediastinal lymphadenopathy diagnosed in May 2024.  Molecular studies by [id number] showed no actionable mutations and PD-L1 expression of 2%.  PRIOR THERAPY: A course of concurrent chemoradiation with weekly carboplatin for AUC of 2 and paclitaxel 45 Mg/M2.  First dose July 21, 2022.  Status post 7 cycles.  Last dose was given on 09/01/2022.  CURRENT THERAPY: Consolidation treatment with immunotherapy with Imfinzi 1500 Mg IV every 4 weeks.  First dose 10/09/2022  INTERVAL HISTORY: Barbara Green 82 y.o. female returns to the clinic today for follow-up visit accompanied by her daughter.  The patient is feeling fine today with no concerning complaints except for the fatigue.  She denied having any current chest pain, shortness of breath, cough or hemoptysis.  She denied having any fever or chills.  Accompanied by her daughter.  The patient is feeling fine today with no concerning complaints except for the fatigue.  She denied having any current chest pain, shortness of breath, cough or hemoptysis.  She denied having any fever or chills.  She has no nausea, vomiting, diarrhea or constipation.  She has no headache or visual changes.  She has no recent weight loss or night sweats.  She tolerated the previous course of concurrent chemoradiation fairly well except for the fatigue that started less than 2 weeks ago.  She had repeat CT scan of the chest performed recently and she is here for evaluation and discussion of her scan results and treatment options.  MEDICAL HISTORY: Past Medical History:  Diagnosis Date   History of radiation therapy 06/29/19-07/29/19    Right breast IMRT- Dr. Roselind Messier   Hypertension     ALLERGIES:  has No Known Allergies.  MEDICATIONS:  Current Outpatient Medications  Medication Sig Dispense Refill   BONINE 25 MG CHEW Chew 25 mg by mouth daily as needed (for vertigo).     demeclocycline (DECLOMYCIN) 150 MG tablet TAKE 1 TABLET BY MOUTH 2 TIMES DAILY. 60 tablet 0   lisinopril (ZESTRIL) 5 MG tablet Take 1 tablet (5 mg total) by mouth daily. 90 tablet 1   nicotine (NICODERM CQ - DOSED IN MG/24 HOURS) 14 mg/24hr patch Place 1 patch (14 mg total) onto the skin daily. 30 patch 0   prochlorperazine (COMPAZINE) 10 MG tablet Take 1 tablet (10 mg total) by mouth every 6 (six) hours as needed. (Patient not taking: Reported on 07/16/2022) 30 tablet 2   sucralfate (CARAFATE) 1 g tablet Take 1 tablet (1 g total) by mouth 4 (four) times daily -  with meals and at bedtime. Crush and dissolve in 10 mL's of warm water prior to swallowing, take 20 minutes prior to meals 120 tablet 1   UNKNOWN TO PATIENT Take 1 tablet by mouth See admin instructions. Unnamed allergy tablet- Take 1 tablet by mouth once a day as needed for seasonal allergies     No current facility-administered medications for this visit.    SURGICAL HISTORY:  Past Surgical History:  Procedure Laterality Date   BREAST LUMPECTOMY WITH SENTINEL LYMPH NODE BIOPSY Right 05/17/2019   Procedure: RIGHT BREAST LUMPECTOMY WITH SENTINEL LYMPH NODE BIOPSY;  Surgeon: Abigail Miyamoto, MD;  Location: [facility];  Service: General;  Laterality: Right;   BRONCHIAL BIOPSY  06/12/2022   Procedure: BRONCHIAL BIOPSIES;  Surgeon: Omar Person, MD;  Location: [facility] ENDOSCOPY;  Service: Pulmonary;;   BRONCHIAL NEEDLE ASPIRATION BIOPSY  06/12/2022   Procedure: BRONCHIAL NEEDLE ASPIRATION BIOPSIES;  Surgeon: Omar Person, MD;  Location[facility] END[facility]COPY;  Service: Pulmonary;;   FIDUCIAL MARKER PLACEMENT  06/12/2022   Procedure: FIDUCIAL MARKER PLACEMENT;  Surgeon: Omar Person,  MD;  Location: [facility] ENDOSCOPY;  Service: Pulmonary;;   NO PAST SURGERIES     VIDEO BRONCHOSCOPY WITH ENDOBRONCHIAL ULTRASOUND N/A 06/12/2022   Procedure: VIDEO BRONCHOSCOPY WITH ENDOBRONCHIAL ULTRASOUND;  Surgeon: Omar Person, MD;  Location[facility] ENDOSCOPY;  Service: Pulmonary;  Laterality: N/A;    REVIEW OF SYSTEMS:  Constitutional: positive for fatigue Eyes: negative Ears, nose, mouth, throat, and face: negative Respiratory: negative Cardiovascular: negative Gastrointestinal: negative Genitourinary:negative Integument/breast: negative Hematologic/lymphatic: negative Musculoskeletal:negative Neurological: negative Behavioral/Psych: negative Endocrine: negative Allergic/Immunologic: negative   PHYSICAL EXAMINATION: General appearance: alert, cooperative, fatigued, and no distress Head: Normocephalic, without obvious abnormality, atraumatic Neck: no adenopathy, no JVD, supple, symmetrical, trachea midline, and thyroid not enlarged, symmetric, no tenderness/mass/nodules Lymph nodes: Cervical, supraclavicular, and axillary nodes normal. Resp: clear to auscultation bilaterally Back: symmetric, no curvature. ROM normal. No CVA tenderness. Cardio: regular rate and rhythm, S1, S2 normal, no murmur, click, rub or gallop GI: soft, non-tender; bowel sounds normal; no masses,  no organomegaly Extremities: extremities normal, atraumatic, no cyanosis or edema Neurologic: Alert and oriented X 3, normal strength and tone. Normal symmetric reflexes. Normal coordination and gait  ECOG PERFORMANCE STATUS: 1 - Symptomatic but completely ambulatory  Blood pressure 104/67, pulse (!) 110, temperature 97.7 F (36.5 C), temperature source Oral, resp. rate 15, height 5' (1.524 m), weight 95 lb (43.1 kg), SpO2 100%.  LABORATORY DATA: Lab Results  Component Value Date   WBC 6.9 09/25/2022   HGB 10.8 (L) 09/25/2022   HCT 31.5 (L) 09/25/2022   MCV 87.5 09/25/2022   PLT 318 09/25/2022       Chemistry      Component Value Date/Time   NA 129 (L) 09/25/2022 1102   NA 124 (L) 05/26/2022 1323   K 3.8 09/25/2022 1102   CL 94 (L) 09/25/2022 1102   CO2 29 09/25/2022 1102   BUN 7 (L) 09/25/2022 1102   BUN 7 (L) 05/26/2022 1323   CREATININE 0.39 (L) 09/25/2022 1102      Component Value Date/Time   CALCIUM 8.8 (L) 09/25/2022 1102   ALKPHOS 77 09/25/2022 1102   AST 17 09/25/2022 1102   ALT 12 09/25/2022 1102   BILITOT 0.5 09/25/2022 1102       RADIOGRAPHIC STUDIES: No results found.  ASSESSMENT AND PLAN: This is a very pleasant 82 years old white female recently diagnosed with a stage IIIA (T2b, N2, M0) non-small cell lung cancer, squamous cell carcinoma presented with left lower lobe lung mass in addition to ipsilateral infrahilar nodal metastasis diagnosed in June 2024. She is not a good surgical candidate for resection. She underwent a course of concurrent chemoradiation with weekly carboplatin for AUC of 2 and paclitaxel 45 Mg/M2.  Status post 7 cycles.  Last dose was given on 09/01/2022. The patient tolerated the previous course of treatment fairly well with no concerning adverse effect except for the fatigue. She had repeat CT scan of the chest performed recently.  The final report is still pending but I personally and independently reviewed the scan images and  discussed the result and showed the images to the patient and her daughter.  Based on my review of the scan she had significant improvement in the left lower lobe lung mass and possibly the mediastinal lymphadenopathy.  I did not see any evidence for disease progression but I will wait for the final report for confirmation. I recommended for the patient to proceed with consolidation treatment with immunotherapy with Imfinzi 1500 Mg IV every 4 weeks for 1 year unless the patient has evidence for disease progression or unacceptable toxicity. I discussed with the patient the adverse effect of this treatment including but not  limited to immunotherapy mediated skin rash, diarrhea, inflammation of the lung, kidney, liver, thyroid or other endocrine dysfunction. The patient is interested in the treatment that she is expected to start the first cycle of this treatment next week. For the hyponatremia, she will continue her treatment with demeclocycline in addition to fresh water restriction. I will see her back for follow-up visit in 5 weeks for evaluation before starting cycle #2. The patient was advised to call immediately if she has any other concerning symptoms in the interval. The patient voices understanding of current disease status and treatment options and is in agreement with the current care plan.  All questions were answered. The patient knows to call the clinic with any problems, questions or concerns. We can certainly see the patient much sooner if necessary.  The total time spent in the appointment was 30 minutes.  Disclaimer: This note was dictated with voice recognition software. Similar sounding words can inadvertently be transcribed and may not be corrected upon review.

## 2022-10-01 NOTE — Progress Notes (Signed)
DISCONTINUE ON PATHWAY REGIMEN - Non-Small Cell Lung     A cycle is every 7 days, concurrent with RT:     Paclitaxel      Carboplatin   **Always confirm dose/schedule in your pharmacy ordering system**  REASON: Continuation Of Treatment PRIOR TREATMENT: [id number]: Carboplatin AUC=2 + Paclitaxel 45 mg/m2 Weekly During Radiation TREATMENT RESPONSE: Partial Response (PR)  START ON PATHWAY REGIMEN - Non-Small Cell Lung     A cycle is every 28 days:     Durvalumab   **Always confirm dose/schedule in your pharmacy ordering system**  Patient Characteristics: Preoperative or Nonsurgical Candidate (Clinical Staging), Stage III - Nonsurgical Candidate (Nonsquamous and Squamous), PS = 0, 1 Therapeutic Status: Preoperative or Nonsurgical Candidate (Clinical Staging) AJCC T Category: cT2b AJCC N Category: cN2 AJCC M Category: cM0 AJCC 8 Stage Grouping: IIIA ECOG Performance Status: 1 Intent of Therapy: Curative Intent, Discussed with Patient

## 2022-10-02 ENCOUNTER — Inpatient Hospital Stay: Payer: Medicare HMO | Admitting: Physician Assistant

## 2022-10-03 ENCOUNTER — Telehealth: Payer: Self-pay | Admitting: Medical Oncology

## 2022-10-03 NOTE — Telephone Encounter (Signed)
Dtr concerned about appt conflict with infusion and [facility] f/u. Message sent to [facility] w Dr. Roselind Messier

## 2022-10-04 ENCOUNTER — Other Ambulatory Visit: Payer: Self-pay

## 2022-10-05 ENCOUNTER — Other Ambulatory Visit: Payer: Self-pay

## 2022-10-07 ENCOUNTER — Encounter: Payer: Self-pay | Admitting: Internal Medicine

## 2022-10-07 ENCOUNTER — Encounter: Payer: Self-pay | Admitting: Radiation Oncology

## 2022-10-08 ENCOUNTER — Telehealth: Payer: Self-pay | Admitting: Medical Oncology

## 2022-10-08 NOTE — Progress Notes (Signed)
  Radiation Oncology         [phone] ________________________________  Name: Barbara Green MRN: [id number]  Date: 10/09/2022  DOB: 04/12/1940  End of Treatment Note  Diagnosis:  The primary encounter diagnosis was Primary malignant neoplasm of left lower lobe of lung (HCC). A diagnosis of Stage III squamous cell carcinoma of left lung (HCC) was also pertinent to this visit.   Recent diagnosis of at least stage IIIA (T3, N1, Mx) non-small cell lung cancer, squamous cell carcinoma.  She presented with a left lower lobe mass and mildly enlarged left infrahilar lymph nodes    History of stage IA (pT1c, pN0, cM0) Right Breast UIQ, Invasive Ductal Carcinoma, ER+ / PR+ / Her2-, Grade 2: diagnosed in 2021, s/p lumpectomy and XRT     Indication for treatment: Curative        Radiation treatment dates: 07/24/22 through 09/08/22  Site/dose: Left lung - 60 Gy delivered in 30 Fx at 2 Gy/Fx  Technique/Mode: 3D / Photon   Beams/energy: 6X  Narrative: The patient tolerated radiation treatment relatively well. During her final weekly treatment check on 08/06, the patient endorsed a mild cough which improved throughout the course of treatment. She otherwise tolerated treatments quite well without any esophageal issues.  Plan: The patient has completed radiation treatment. The patient will return to radiation oncology clinic for routine followup in one month. I advised them to call or return sooner if they have any questions or concerns related to their recovery or treatment.  -----------------------------------  Billie Lade, PhD, MD  This document serves as a record of services personally performed by Antony Blackbird, MD. It was created on his behalf by Neena Rhymes, a trained medical scribe. The creation of this record is based on the scribe's personal observations and the provider's statements to them. This document has been checked and approved by the attending provider.

## 2022-10-08 NOTE — Progress Notes (Signed)
Radiation Oncology         [phone] ________________________________  Name: Barbara Green MRN: [id number]  Date: 10/09/2022  DOB: 1940-03-02  Follow-Up Visit Note  CC: [name], MD  Omar Person, MD  No diagnosis found.  Diagnosis: The primary encounter diagnosis was Primary malignant neoplasm of left lower lobe of lung (HCC). A diagnosis of Stage III squamous cell carcinoma of left lung (HCC) was also pertinent to this visit.   Recent diagnosis of at least stage IIIA (T3, N1, Mx) non-small cell lung cancer, squamous cell carcinoma.  She presented with a left lower lobe mass and mildly enlarged left infrahilar lymph nodes    History of stage IA (pT1c, pN0, cM0) Right Breast UIQ, Invasive Ductal Carcinoma, ER+ / PR+ / Her2-, Grade 2: diagnosed in 2021, s/p lumpectomy and XRT     Interval Since Last Radiation: 1 month   Indication for treatment: Curative       Radiation treatment dates: 07/24/22 through 09/08/22 Site/dose: Left lung - 60 Gy delivered in 30 Fx at 2 Gy/Fx Technique/Mode: 3D / Photon  Beams/energy: 6X  Narrative:  The patient returns today for routine follow-up. The patient tolerated radiation treatment relatively well. During her final weekly treatment check on 08/06, the patient endorsed a mild cough which improved throughout the course of treatment. She otherwise tolerated treatments quite well without any esophageal issues.    She has now also completed concurrent chemotherapy consisting of carboplatin and paclitaxel x 7 cycles (delivered from 07/21/22 through 09/01/22) under the care of Dr. Arbutus Ped. She tolerated chemotherapy well overall without any significant side effects. Her main symptom throughout the course of systemic treatment consisted of fatigue.          She is scheduled to begin consolidation immunotherapy today (infinzi). This will be given q4 weeks x 1 year unless she has evidence or disease progression or if she developed an  unacceptable toxicity.       Pertinent imaging performed in the interval includes a restaging chest CT on 09/25/22 which demonstrated: a decrease in size of the now cavitary left lower lobe lung mass, and a decrease in size of the previously hypermetabolic left infrahilar lymph node. CT otherwise showed no new mass lesions, fluid collections, or evidence of new lymphadenopathy. A decrease in size was also seen of some small mediastinal lymph nodes, including several located to the left of carina.      ***       Allergies:  has No Known Allergies.  Meds: Current Outpatient Medications  Medication Sig Dispense Refill   BONINE 25 MG CHEW Chew 25 mg by mouth daily as needed (for vertigo).     demeclocycline (DECLOMYCIN) 150 MG tablet TAKE 1 TABLET BY MOUTH 2 TIMES DAILY. 60 tablet 0   lisinopril (ZESTRIL) 5 MG tablet Take 1 tablet (5 mg total) by mouth daily. 90 tablet 1   nicotine (NICODERM CQ - DOSED IN MG/24 HOURS) 14 mg/24hr patch Place 1 patch (14 mg total) onto the skin daily. 30 patch 0   prochlorperazine (COMPAZINE) 10 MG tablet Take 1 tablet (10 mg total) by mouth every 6 (six) hours as needed. (Patient not taking: Reported on 07/16/2022) 30 tablet 2   sucralfate (CARAFATE) 1 g tablet Take 1 tablet (1 g total) by mouth 4 (four) times daily -  with meals and at bedtime. Crush and dissolve in 10 mL's of warm water prior to swallowing, take 20 minutes prior to meals 120 tablet  1   UNKNOWN TO PATIENT Take 1 tablet by mouth See admin instructions. Unnamed allergy tablet- Take 1 tablet by mouth once a day as needed for seasonal allergies     No current facility-administered medications for this encounter.    Physical Findings: The patient is in no acute distress. Patient is alert and oriented.  vitals were not taken for this visit. .  No significant changes. Lungs are clear to auscultation bilaterally. Heart has regular rate and rhythm. No palpable cervical, supraclavicular, or axillary  adenopathy. Abdomen soft, non-tender, normal bowel sounds.   Lab Findings: Lab Results  Component Value Date   WBC 6.9 09/25/2022   HGB 10.8 (L) 09/25/2022   HCT 31.5 (L) 09/25/2022   MCV 87.5 09/25/2022   PLT 318 09/25/2022    Radiographic Findings: CT Chest W Contrast  Result Date: 10/01/2022 CLINICAL DATA:  Staging non-small-cell lung cancer. * Tracking Code: BO * EXAM: CT CHEST WITH CONTRAST TECHNIQUE: Multidetector CT imaging of the chest was performed during intravenous contrast administration. RADIATION DOSE REDUCTION: This exam was performed according to the departmental dose-optimization program which includes automated exposure control, adjustment of the mA and/or kV according to patient size and/or use of iterative reconstruction technique. CONTRAST:  75mL OMNIPAQUE IOHEXOL 300 MG/ML  SOLN COMPARISON:  CT 05/29/2022.  PET-CT scan 07/08/2022 FINDINGS: Cardiovascular: Heart is nonenlarged. No pericardial effusion. The thoracic aorta has a normal course and caliber with scattered partially calcified atherosclerotic plaque. There is a bovine type aortic arch. There is significant plaque extending into the origin of the left common carotid artery. Please correlate with any symptoms. Mediastinum/Nodes: Small hiatal hernia. Slightly patulous upper thoracic esophagus. Small thyroid gland. No specific abnormal lymph node enlargement seen in the axillary regions, right hilum. There are some small nodes identified in the left hilum but are smaller today. Previously node in this location would have measured 18 mm and today on series 2 image 72 measures 1.1 x 0.9 cm. Small node to the left of the carina which was slightly hypermetabolic on prior examination is smaller today on series 2, image 55. No new abnormal mediastinal nodal enlargement. Lungs/Pleura: The left lower lobe hypermetabolic mass measuring 5.0 x 4.6 cm, today is now cavitary and smaller. The cavitary lesion in total would have dimensions  of 4.1 by 3.2 cm on series 5, image 88. The more solid nodule along the inferolateral aspect of the cystic area measures 18 by 14 mm. There are some adjacent bandlike opacities with some increasing adjacent patchy ground-glass, nonspecific. Additional smaller areas of ground-glass in the left upper lobe such as axial series 5, image 62. The ill-defined opacity along the middle lobe with pleural thickening is stable. No new lung mass. No developing pneumothorax or effusion. Apical pleural thickening. Underlying emphysematous lung changes. Upper Abdomen: Fatty liver infiltration in the upper abdomen. Stable slightly thickened adrenal glands. Musculoskeletal: Osteopenia. Curvature of the spine with degenerative changes. Calcific density seen medial to the right shoulder, unchanged from previous. Again likely a benign lesion. Previously there was some sclerosis along the sternum on the left side, unchanged. There are some degenerative changes of the sternoclavicular joint. Old right-sided rib fractures. Of note there is asymmetric right-sided breast skin thickening. IMPRESSION: Overall improvement. Decreasing lower lobe left-sided lung mass now with a significant cavitary component in the small residual nodular component. Decreased size of the previously hypermetabolic left infrahilar lymph node. Some small mediastinal nodes are also decreased today including to the left of the carina. No  new mass lesion, fluid collection or lymph node enlargement. Hiatal hernia.  Fatty liver infiltration. Underlying chronic lung changes. Asymmetric right-sided breast skin thickening, seen previously. Please correlate with clinical findings. Aortic Atherosclerosis (ICD10-I70.0) and Emphysema (ICD10-J43.9). Electronically Signed   By: Karen Kays M.D.   On: 10/01/2022 18:35    Impression: Recent diagnosis of at least stage IIIA (T3, N1, Mx) non-small cell lung cancer, squamous cell carcinoma.  She presented with a left lower lobe mass  and mildly enlarged left infrahilar lymph nodes    History of stage IA (pT1c, pN0, cM0) Right Breast UIQ, Invasive Ductal Carcinoma, ER+ / PR+ / Her2-, Grade 2: diagnosed in 2021, s/p lumpectomy and XRT     The patient is recovering from the effects of radiation.  ***  Plan:  ***   *** minutes of total time was spent for this patient encounter, including preparation, face-to-face counseling with the patient and coordination of care, physical exam, and documentation of the encounter. ____________________________________  Billie Lade, PhD, MD  This document serves as a record of services personally performed by Antony Blackbird, MD. It was created on his behalf by Neena Rhymes, a trained medical scribe. The creation of this record is based on the scribe's personal observations and the provider's statements to them. This document has been checked and approved by the attending provider.

## 2022-10-08 NOTE — Telephone Encounter (Signed)
I confirmed pt appts tomorrow for lab and infusion. Dtr asking to see Dr Roselind Messier at 1pm NOT 1045.  It is too far for her to come early and wait until 1330 for labs.

## 2022-10-09 ENCOUNTER — Encounter: Payer: Self-pay | Admitting: Internal Medicine

## 2022-10-09 ENCOUNTER — Other Ambulatory Visit: Payer: Self-pay | Admitting: Internal Medicine

## 2022-10-09 ENCOUNTER — Telehealth: Payer: Self-pay | Admitting: *Deleted

## 2022-10-09 ENCOUNTER — Other Ambulatory Visit: Payer: Self-pay

## 2022-10-09 ENCOUNTER — Inpatient Hospital Stay: Payer: Medicare HMO

## 2022-10-09 ENCOUNTER — Encounter: Payer: Self-pay | Admitting: Radiation Oncology

## 2022-10-09 ENCOUNTER — Ambulatory Visit: Payer: Self-pay | Admitting: Radiation Oncology

## 2022-10-09 ENCOUNTER — Ambulatory Visit
Admission: RE | Admit: 2022-10-09 | Discharge: 2022-10-09 | Disposition: A | Discharge status: Home / Self Care | Payer: Medicare HMO | Source: Ambulatory Visit | Attending: Radiation Oncology | Admitting: Radiation Oncology

## 2022-10-09 VITALS — BP 111/54 | HR 109 | Temp 97.3°F | Resp 20 | Wt 95.8 lb

## 2022-10-09 VITALS — HR 89

## 2022-10-09 DIAGNOSIS — C50211 Malignant neoplasm of upper-inner quadrant of right female breast: Secondary | ICD-10-CM

## 2022-10-09 DIAGNOSIS — C3492 Malignant neoplasm of unspecified part of left bronchus or lung: Secondary | ICD-10-CM

## 2022-10-09 DIAGNOSIS — Z5111 Encounter for antineoplastic chemotherapy: Secondary | ICD-10-CM | POA: Diagnosis not present

## 2022-10-09 LAB — CBC WITH DIFFERENTIAL (CANCER CENTER ONLY)
Abs Immature Granulocytes: 0.04 10*3/uL (ref 0.00–0.07)
Basophils Absolute: 0.1 10*3/uL (ref 0.0–0.1)
Basophils Relative: 1 %
Eosinophils Absolute: 0.2 10*3/uL (ref 0.0–0.5)
Eosinophils Relative: 3 %
HCT: 33 % — ABNORMAL LOW (ref 36.0–46.0)
Hemoglobin: 10.9 g/dL — ABNORMAL LOW (ref 12.0–15.0)
Immature Granulocytes: 0 %
Lymphocytes Relative: 15 %
Lymphs Abs: 1.4 10*3/uL (ref 0.7–4.0)
MCH: 29.9 pg (ref 26.0–34.0)
MCHC: 33 g/dL (ref 30.0–36.0)
MCV: 90.4 fL (ref 80.0–100.0)
Monocytes Absolute: 1.5 10*3/uL — ABNORMAL HIGH (ref 0.1–1.0)
Monocytes Relative: 17 %
Neutro Abs: 6 10*3/uL (ref 1.7–7.7)
Neutrophils Relative %: 64 %
Platelet Count: 475 10*3/uL — ABNORMAL HIGH (ref 150–400)
RBC: 3.65 MIL/uL — ABNORMAL LOW (ref 3.87–5.11)
RDW: 15.3 % (ref 11.5–15.5)
WBC Count: 9.3 10*3/uL (ref 4.0–10.5)
nRBC: 0 % (ref 0.0–0.2)

## 2022-10-09 LAB — CMP (CANCER CENTER ONLY)
ALT: 15 U/L (ref 0–44)
AST: 22 U/L (ref 15–41)
Albumin: 3.6 g/dL (ref 3.5–5.0)
Alkaline Phosphatase: 95 U/L (ref 38–126)
Anion gap: 6 (ref 5–15)
BUN: 8 mg/dL (ref 8–23)
CO2: 29 mmol/L (ref 22–32)
Calcium: 9.2 mg/dL (ref 8.9–10.3)
Chloride: 94 mmol/L — ABNORMAL LOW (ref 98–111)
Creatinine: 0.32 mg/dL — ABNORMAL LOW (ref 0.44–1.00)
GFR, Estimated: >60 mL/min (ref >60)
Glucose, Bld: 112 mg/dL — ABNORMAL HIGH (ref 70–99)
Potassium: 3.9 mmol/L (ref 3.5–5.1)
Sodium: 129 mmol/L — ABNORMAL LOW (ref 135–145)
Total Bilirubin: 0.4 mg/dL (ref 0.3–1.2)
Total Protein: 6.6 g/dL (ref 6.5–8.1)

## 2022-10-09 LAB — TSH: TSH: 1.841 u[IU]/mL (ref 0.350–4.500)

## 2022-10-09 MED ORDER — SODIUM CHLORIDE 0.9 % IV SOLN
1500.0000 mg | Freq: Once | INTRAVENOUS | Status: AC
  Administered 2022-10-09: 1500 mg via INTRAVENOUS
  Filled 2022-10-09: qty 30

## 2022-10-09 MED ORDER — SODIUM CHLORIDE 0.9 % IV SOLN: Freq: Once | INTRAVENOUS | Status: AC

## 2022-10-09 NOTE — Telephone Encounter (Signed)
CALLED PATIENT TO ASK ABOUT COMING FOR FU ON 10-09-22 @ 1 PM, LVM FOR A RETURN CALL

## 2022-10-09 NOTE — Patient Instructions (Signed)
[facility]  Discharge Instructions: Thank you for choosing [facility] to provide your oncology and hematology care.   If you have a lab appointment with the Cancer Center, please go directly to the Cancer Center and check in at the registration area.   Wear comfortable clothing and clothing appropriate for easy access to any Portacath or PICC line.   We strive to give you quality time with your provider. You may need to reschedule your appointment if you arrive late (15 or more minutes).  Arriving late affects you and other patients whose appointments are after yours.  Also, if you miss three or more appointments without notifying the office, you may be dismissed from the clinic at the provider's discretion.      For prescription refill requests, have your pharmacy contact our office and allow 72 hours for refills to be completed.    Today you received the following chemotherapy and/or immunotherapy agents imfinzi      To help prevent nausea and vomiting after your treatment, we encourage you to take your nausea medication as directed.  BELOW ARE SYMPTOMS THAT SHOULD BE REPORTED IMMEDIATELY: *FEVER GREATER THAN 100.4 F (38 C) OR HIGHER *CHILLS OR SWEATING *NAUSEA AND VOMITING THAT IS NOT CONTROLLED WITH YOUR NAUSEA MEDICATION *UNUSUAL SHORTNESS OF BREATH *UNUSUAL BRUISING OR BLEEDING *URINARY PROBLEMS (pain or burning when urinating, or frequent urination) *BOWEL PROBLEMS (unusual diarrhea, constipation, pain near the anus) TENDERNESS IN MOUTH AND THROAT WITH OR WITHOUT PRESENCE OF ULCERS (sore throat, sores in mouth, or a toothache) UNUSUAL RASH, SWELLING OR PAIN  UNUSUAL VAGINAL DISCHARGE OR ITCHING   Items with * indicate a potential emergency and should be followed up as soon as possible or go to the Emergency Department if any problems should occur.  Please show the CHEMOTHERAPY ALERT CARD or IMMUNOTHERAPY ALERT CARD at check-in  to the Emergency Department and triage nurse.  Should you have questions after your visit or need to cancel or reschedule your appointment, please contact [facility]  Dept: [phone]  and follow the prompts.  Office hours are 8:00 a.m. to 4:30 p.m. Monday - Friday. Please note that voicemails left after 4:00 p.m. may not be returned until the following business day.  We are closed weekends and major holidays. You have access to a nurse at all times for urgent questions. Please call the main number to the clinic Dept: [phone] and follow the prompts.   For any non-urgent questions, you may also contact your provider using MyChart. We now offer e-Visits for anyone 18 and older to request care online for non-urgent symptoms. For details visit [email or web address].   Also download the MyChart app! Go to the app store, search "MyChart", open the app, select [facility], and log in with your MyChart username and password.   

## 2022-10-09 NOTE — Progress Notes (Signed)
Barbara Green is here today for follow up post radiation to the lung.  Lung Side: Left,patient completed treatment on 08/12/4  Does the patient complain of any of the following: Pain:No Shortness of breath w/wo exertion: Yes at times. Cough: Yes, dry cough Hemoptysis: No Pain with swallowing: No Swallowing/choking concerns: No Appetite: Improving  Energy Level: Low  Post radiation skin Changes: Skin is improving. Continues to use sonafine as needed.     Additional comments if applicable:;  BP (!) 111/54   Pulse (!) 109   Resp 20   Wt 95 lb 12.8 oz (43.5 kg)   SpO2 98%   BMI 18.71 kg/m

## 2022-10-10 ENCOUNTER — Other Ambulatory Visit: Payer: Self-pay

## 2022-10-10 LAB — T4: T4, Total: 8.2 ug/dL (ref 4.5–12.0)

## 2022-10-14 ENCOUNTER — Other Ambulatory Visit: Payer: Self-pay | Admitting: Medical Oncology

## 2022-10-14 DIAGNOSIS — I878 Other specified disorders of veins: Secondary | ICD-10-CM

## 2022-10-15 ENCOUNTER — Other Ambulatory Visit: Payer: Self-pay | Admitting: Radiation Oncology

## 2022-10-15 ENCOUNTER — Other Ambulatory Visit: Payer: Self-pay | Admitting: Physician Assistant

## 2022-10-15 DIAGNOSIS — C50211 Malignant neoplasm of upper-inner quadrant of right female breast: Secondary | ICD-10-CM

## 2022-10-15 DIAGNOSIS — C3492 Malignant neoplasm of unspecified part of left bronchus or lung: Secondary | ICD-10-CM

## 2022-10-15 DIAGNOSIS — E871 Hypo-osmolality and hyponatremia: Secondary | ICD-10-CM

## 2022-10-27 ENCOUNTER — Encounter: Payer: Self-pay | Admitting: Internal Medicine

## 2022-10-27 ENCOUNTER — Encounter: Payer: Self-pay | Admitting: Radiation Oncology

## 2022-10-28 ENCOUNTER — Observation Stay (HOSPITAL_COMMUNITY)
Admission: EM | Admit: 2022-10-28 | Discharge: 2022-10-29 | Disposition: A | Discharge status: Home / Self Care | Payer: Medicare HMO | Attending: Internal Medicine | Admitting: Internal Medicine

## 2022-10-28 ENCOUNTER — Emergency Department (HOSPITAL_COMMUNITY): Payer: Medicare HMO

## 2022-10-28 ENCOUNTER — Encounter (HOSPITAL_COMMUNITY): Payer: Self-pay

## 2022-10-28 ENCOUNTER — Telehealth: Payer: Self-pay

## 2022-10-28 ENCOUNTER — Other Ambulatory Visit: Payer: Self-pay

## 2022-10-28 DIAGNOSIS — Z79899 Other long term (current) drug therapy: Secondary | ICD-10-CM | POA: Insufficient documentation

## 2022-10-28 DIAGNOSIS — Z85118 Personal history of other malignant neoplasm of bronchus and lung: Secondary | ICD-10-CM | POA: Insufficient documentation

## 2022-10-28 DIAGNOSIS — R0602 Shortness of breath: Secondary | ICD-10-CM | POA: Diagnosis not present

## 2022-10-28 DIAGNOSIS — E876 Hypokalemia: Secondary | ICD-10-CM | POA: Insufficient documentation

## 2022-10-28 DIAGNOSIS — Z87891 Personal history of nicotine dependence: Secondary | ICD-10-CM | POA: Insufficient documentation

## 2022-10-28 DIAGNOSIS — A419 Sepsis, unspecified organism: Secondary | ICD-10-CM | POA: Diagnosis not present

## 2022-10-28 DIAGNOSIS — E871 Hypo-osmolality and hyponatremia: Secondary | ICD-10-CM | POA: Diagnosis not present

## 2022-10-28 DIAGNOSIS — I10 Essential (primary) hypertension: Secondary | ICD-10-CM | POA: Diagnosis not present

## 2022-10-28 DIAGNOSIS — J189 Pneumonia, unspecified organism: Secondary | ICD-10-CM | POA: Diagnosis not present

## 2022-10-28 DIAGNOSIS — Z9889 Other specified postprocedural states: Secondary | ICD-10-CM | POA: Insufficient documentation

## 2022-10-28 DIAGNOSIS — R42 Dizziness and giddiness: Secondary | ICD-10-CM | POA: Diagnosis present

## 2022-10-28 DIAGNOSIS — K219 Gastro-esophageal reflux disease without esophagitis: Secondary | ICD-10-CM | POA: Insufficient documentation

## 2022-10-28 LAB — CBC
HCT: 36.4 % (ref 36.0–46.0)
Hemoglobin: 11.9 g/dL — ABNORMAL LOW (ref 12.0–15.0)
MCH: 29.2 pg (ref 26.0–34.0)
MCHC: 32.7 g/dL (ref 30.0–36.0)
MCV: 89.4 fL (ref 80.0–100.0)
Platelets: 533 10*3/uL — ABNORMAL HIGH (ref 150–400)
RBC: 4.07 MIL/uL (ref 3.87–5.11)
RDW: 14.6 % (ref 11.5–15.5)
WBC: 10.5 10*3/uL (ref 4.0–10.5)
nRBC: 0 % (ref 0.0–0.2)

## 2022-10-28 LAB — HEPATIC FUNCTION PANEL
ALT: 35 U/L (ref 0–44)
AST: 37 U/L (ref 15–41)
Albumin: 3.7 g/dL (ref 3.5–5.0)
Alkaline Phosphatase: 115 U/L (ref 38–126)
Bilirubin, Direct: <0.1 mg/dL (ref 0.0–0.2)
Total Bilirubin: 0.4 mg/dL (ref 0.3–1.2)
Total Protein: 7.3 g/dL (ref 6.5–8.1)

## 2022-10-28 LAB — URINALYSIS, W/ REFLEX TO CULTURE (INFECTION SUSPECTED)
Bacteria, UA: NONE SEEN
Bilirubin Urine: NEGATIVE
Glucose, UA: NEGATIVE mg/dL
Hgb urine dipstick: NEGATIVE
Ketones, ur: 20 mg/dL — AB
Nitrite: NEGATIVE
Protein, ur: NEGATIVE mg/dL
Specific Gravity, Urine: 1.016 (ref 1.005–1.030)
pH: 6 (ref 5.0–8.0)

## 2022-10-28 LAB — BASIC METABOLIC PANEL
Anion gap: 12 (ref 5–15)
BUN: 11 mg/dL (ref 8–23)
CO2: 23 mmol/L (ref 22–32)
Calcium: 9.1 mg/dL (ref 8.9–10.3)
Chloride: 91 mmol/L — ABNORMAL LOW (ref 98–111)
Creatinine, Ser: 0.33 mg/dL — ABNORMAL LOW (ref 0.44–1.00)
GFR, Estimated: >60 mL/min (ref >60)
Glucose, Bld: 100 mg/dL — ABNORMAL HIGH (ref 70–99)
Potassium: 4 mmol/L (ref 3.5–5.1)
Sodium: 126 mmol/L — ABNORMAL LOW (ref 135–145)

## 2022-10-28 LAB — PROTIME-INR
INR: 0.9 (ref 0.8–1.2)
Prothrombin Time: 12.1 s (ref 11.4–15.2)

## 2022-10-28 LAB — CBG MONITORING, ED: Glucose-Capillary: 111 mg/dL — ABNORMAL HIGH (ref 70–99)

## 2022-10-28 LAB — NA AND K (SODIUM & POTASSIUM), RAND UR
Potassium Urine: 69 mmol/L
Sodium, Ur: 72 mmol/L

## 2022-10-28 LAB — TSH: TSH: 1.622 u[IU]/mL (ref 0.350–4.500)

## 2022-10-28 LAB — TROPONIN I (HIGH SENSITIVITY)
Troponin I (High Sensitivity): 3 ng/L
Troponin I (High Sensitivity): 3 ng/L

## 2022-10-28 LAB — BRAIN NATRIURETIC PEPTIDE: B Natriuretic Peptide: 47.6 pg/mL (ref 0.0–100.0)

## 2022-10-28 LAB — I-STAT CG4 LACTIC ACID, ED: Lactic Acid, Venous: 1.3 mmol/L (ref 0.5–1.9)

## 2022-10-28 LAB — MAGNESIUM: Magnesium: 2 mg/dL (ref 1.7–2.4)

## 2022-10-28 MED ORDER — ACETAMINOPHEN 650 MG RE SUPP: 650.0000 mg | Freq: Four times a day (QID) | RECTAL | Status: DC | PRN

## 2022-10-28 MED ORDER — HYDROCOD POLI-CHLORPHE POLI ER 10-8 MG/5ML PO SUER: 5.0000 mL | Freq: Two times a day (BID) | ORAL | Status: DC | PRN

## 2022-10-28 MED ORDER — LISINOPRIL 10 MG PO TABS
5.0000 mg | ORAL_TABLET | Freq: Every day | ORAL | Status: DC
  Administered 2022-10-29: 5 mg via ORAL
  Filled 2022-10-28: qty 1

## 2022-10-28 MED ORDER — ONDANSETRON HCL 4 MG PO TABS: 4.0000 mg | ORAL_TABLET | Freq: Four times a day (QID) | ORAL | Status: DC | PRN

## 2022-10-28 MED ORDER — IPRATROPIUM-ALBUTEROL 0.5-2.5 (3) MG/3ML IN SOLN: 3.0000 mL | RESPIRATORY_TRACT | Status: DC | PRN

## 2022-10-28 MED ORDER — SODIUM CHLORIDE 0.9 % IV SOLN
1.0000 g | Freq: Once | INTRAVENOUS | Status: AC
  Administered 2022-10-28: 1 g via INTRAVENOUS
  Filled 2022-10-28: qty 10

## 2022-10-28 MED ORDER — IPRATROPIUM-ALBUTEROL 0.5-2.5 (3) MG/3ML IN SOLN
3.0000 mL | Freq: Two times a day (BID) | RESPIRATORY_TRACT | Status: DC
  Administered 2022-10-29: 3 mL via RESPIRATORY_TRACT
  Filled 2022-10-28: qty 3

## 2022-10-28 MED ORDER — IOHEXOL 350 MG/ML SOLN
75.0000 mL | Freq: Once | INTRAVENOUS | Status: AC | PRN
  Administered 2022-10-28: 75 mL via INTRAVENOUS

## 2022-10-28 MED ORDER — SODIUM CHLORIDE 0.9 % IV SOLN
500.0000 mg | INTRAVENOUS | Status: DC
  Administered 2022-10-29: 500 mg via INTRAVENOUS
  Filled 2022-10-28: qty 5

## 2022-10-28 MED ORDER — MAGNESIUM HYDROXIDE 400 MG/5ML PO SUSP: 30.0000 mL | Freq: Every day | ORAL | Status: DC | PRN

## 2022-10-28 MED ORDER — ACETAMINOPHEN 325 MG PO TABS: 650.0000 mg | ORAL_TABLET | Freq: Four times a day (QID) | ORAL | Status: DC | PRN

## 2022-10-28 MED ORDER — IPRATROPIUM-ALBUTEROL 0.5-2.5 (3) MG/3ML IN SOLN
3.0000 mL | Freq: Four times a day (QID) | RESPIRATORY_TRACT | Status: DC
  Administered 2022-10-28: 3 mL via RESPIRATORY_TRACT
  Filled 2022-10-28: qty 3

## 2022-10-28 MED ORDER — SODIUM CHLORIDE 0.9 % IV SOLN
2.0000 g | INTRAVENOUS | Status: DC
  Administered 2022-10-29: 2 g via INTRAVENOUS
  Filled 2022-10-28: qty 20

## 2022-10-28 MED ORDER — NICOTINE 14 MG/24HR TD PT24
14.0000 mg | MEDICATED_PATCH | Freq: Every day | TRANSDERMAL | Status: DC
  Administered 2022-10-29: 14 mg via TRANSDERMAL
  Filled 2022-10-28 (×2): qty 1

## 2022-10-28 MED ORDER — LACTATED RINGERS IV BOLUS (SEPSIS)
1000.0000 mL | Freq: Once | INTRAVENOUS | Status: AC
  Administered 2022-10-28: 1000 mL via INTRAVENOUS

## 2022-10-28 MED ORDER — ENOXAPARIN SODIUM 30 MG/0.3ML IJ SOSY
30.0000 mg | PREFILLED_SYRINGE | INTRAMUSCULAR | Status: DC
  Administered 2022-10-28: 30 mg via SUBCUTANEOUS
  Filled 2022-10-28: qty 0.3

## 2022-10-28 MED ORDER — ONDANSETRON HCL 4 MG/2ML IJ SOLN: 4.0000 mg | Freq: Four times a day (QID) | INTRAMUSCULAR | Status: DC | PRN

## 2022-10-28 MED ORDER — GUAIFENESIN ER 600 MG PO TB12
600.0000 mg | ORAL_TABLET | Freq: Two times a day (BID) | ORAL | Status: DC
  Administered 2022-10-28 – 2022-10-29 (×2): 600 mg via ORAL
  Filled 2022-10-28 (×2): qty 1

## 2022-10-28 MED ORDER — SUCRALFATE 1 G PO TABS: 1.0000 g | ORAL_TABLET | Freq: Three times a day (TID) | ORAL | Status: DC

## 2022-10-28 MED ORDER — TRAZODONE HCL 50 MG PO TABS: 25.0000 mg | ORAL_TABLET | Freq: Every evening | ORAL | Status: DC | PRN

## 2022-10-28 MED ORDER — SODIUM CHLORIDE 0.9 % IV SOLN
500.0000 mg | Freq: Once | INTRAVENOUS | Status: AC
  Administered 2022-10-28: 500 mg via INTRAVENOUS
  Filled 2022-10-28: qty 5

## 2022-10-28 MED ORDER — LACTATED RINGERS IV SOLN
150.0000 mL/h | INTRAVENOUS | Status: AC
  Administered 2022-10-28 – 2022-10-29 (×2): 150 mL/h via INTRAVENOUS

## 2022-10-28 MED ORDER — ENOXAPARIN SODIUM 40 MG/0.4ML IJ SOSY: 40.0000 mg | PREFILLED_SYRINGE | INTRAMUSCULAR | Status: DC

## 2022-10-28 NOTE — Assessment & Plan Note (Signed)
-   This is mainly left-sided pneumonia. - Management as above.

## 2022-10-28 NOTE — Assessment & Plan Note (Signed)
-   The patient will be admitted to a medical telemetry bed. - Will continue antibiotic therapy with IV Rocephin and Zithromax. - We will continue IV hydration with normal saline. - Will follow blood cultures. - Mucolytic therapy and bronchodilator therapy will be provided.

## 2022-10-28 NOTE — Assessment & Plan Note (Signed)
-  We will continue her antihypertensive therapy 

## 2022-10-28 NOTE — Assessment & Plan Note (Signed)
-   We will continue Carafate.

## 2022-10-28 NOTE — H&P (Signed)
[place]   PATIENT NAME: Barbara Green    MR#:  [id number]  DATE OF BIRTH:  1940-03-05  DATE OF ADMISSION:  10/28/2022  PRIMARY CARE PHYSICIAN: [name], MD   Patient is coming from: Home  REQUESTING/REFERRING PHYSICIAN: Gloris Manchester, MD  CHIEF COMPLAINT:   Chief Complaint  Patient presents with  . Dizziness    HISTORY OF PRESENT ILLNESS:  CATARA STACHE is a 82 y.o. Caucasian female with medical history significant for breast and lung cancer, status post breast lumpectomy and radiotherapy for breast and lung cancer, and essential hypertension, who presented to the emergency room with acute onset of dizziness and lightheadedness.  She underwent several cycles of chemotherapy and radiotherapy starting in June and her last treatment was 3 weeks ago.  She has been having generalized fatigue and tiredness with associated dizziness and exercise intolerance as well as lightheadedness over the last few weeks.  She admitted to dyspnea mainly on exertion and dry cough without wheezing  that is new to her symptoms over the last few days.  No fever or chills.  No nausea or vomiting or abdominal pain or diarrhea or constipation.  No dysuria, oliguria or hematuria or flank pain.  No chest pain or palpitations.  No bleeding diathesis.  ED Course: When she came to the ER, heart rate was 133 with otherwise normal vital signs.  Later on respiratory rate was 29.  Labs revealed hemoglobin 11.9 hematocrit 36.4 with platelets of 533.  Lactic acid 1.3 and CMP revealed hyponatremia 126, hypochloremia of 91, with otherwise normal values. EKG as reviewed by me :  EKG showed sinus tachycardia with a rate of 134 with poor R wave progression. Imaging: Portable chest x-ray showed abnormal left lung opacity superimposed on known cavitating mass concerning for multilobar pneumonia. Chest CTA revealed the following: 1. Negative for acute pulmonary embolus.   2. Positive for multilobar new left lung  peribronchial opacity since 09/25/2022, superimposed on stable superior segment lower lobe cavitary mass. Sparing of the right lung favors Multilobar Pneumonia. Trace left pleural effusion. Underlying, Emphysema (ICD10-J43.9), right upper lobe lung scarring.   3. Aortic Atherosclerosis (ICD10-I70.0). Osteopenia with chronic rib and compression fractures.  The patient was given 1 L bolus of IV lactated Ringer as well as IV Rocephin and Zithromax.  She will be admitted to a medical telemetry bed for further evaluation and management.  PAST MEDICAL HISTORY:   Past Medical History:  Diagnosis Date  . History of radiation therapy 06/29/19-07/29/19   Right breast IMRT- Dr. Roselind Messier  . History of radiation therapy    Left Lung- 07/24/22-09/08/22- Dr. Antony Blackbird  . Hypertension     PAST SURGICAL HISTORY:   Past Surgical History:  Procedure Laterality Date  . BREAST LUMPECTOMY WITH SENTINEL LYMPH NODE BIOPSY Right 05/17/2019   Procedure: RIGHT BREAST LUMPECTOMY WITH SENTINEL LYMPH NODE BIOPSY;  Surgeon: Abigail Miyamoto, MD;  Location: [facility];  Service: General;  Laterality: Right;  . BRONCHIAL BIOPSY  06/12/2022   Procedure: BRONCHIAL BIOPSIES;  Surgeon: Omar Person, MD;  Location: [facility] ENDOSCOPY;  Service: Pulmonary;;  . BRONCHIAL NEEDLE ASPIRATION BIOPSY  06/12/2022   Procedure: BRONCHIAL NEEDLE ASPIRATION BIOPSIES;  Surgeon: Omar Person, MD;  Location: [facility] ENDOSCOPY;  Service: Pulmonary;;  . FIDUCIAL MARKER PLACEMENT  06/12/2022   Procedure: FIDUCIAL MARKER PLACEMENT;  Surgeon: Omar Person, MD;  Location: [facility] ENDOSCOPY;  Service: Pulmonary;;  . NO PAST SURGERIES    .  VIDEO BRONCHOSCOPY WITH ENDOBRONCHIAL ULTRASOUND N/A 06/12/2022   Procedure: VIDEO BRONCHOSCOPY WITH ENDOBRONCHIAL ULTRASOUND;  Surgeon: Omar Person, MD;  Location: [facility] ENDOSCOPY;  Service: Pulmonary;  Laterality: N/A;    SOCIAL HISTORY:   Social History   Tobacco Use  . Smoking  status: Some Days    Current packs/day: 0.00    Average packs/day: 0.3 packs/day for 30.0 years (9.9 ttl pk-yrs)    Types: Cigarettes    Start date: 05/1992    Last attempt to quit: 05/2022    Years since quitting: 0.4    Passive exposure: Current  . Smokeless tobacco: Never  Substance Use Topics  . Alcohol use: Yes    Comment: occasionally a glass wine    FAMILY HISTORY:   Family History  Problem Relation Age of Onset  . Alzheimer's disease Mother   . Heart disease Father   . Alcohol abuse Father     DRUG ALLERGIES:  No Known Allergies  REVIEW OF SYSTEMS:   ROS As per history of present illness. All pertinent systems were reviewed above. Constitutional, HEENT, cardiovascular, respiratory, GI, GU, musculoskeletal, neuro, psychiatric, endocrine, integumentary and hematologic systems were reviewed and are otherwise negative/unremarkable except for positive findings mentioned above in the HPI.   MEDICATIONS AT HOME:   Prior to Admission medications   Medication Sig Start Date End Date Taking? Authorizing Provider  BONINE 25 MG CHEW Chew 25 mg by mouth daily as needed (for vertigo).    [provider]  demeclocycline (DECLOMYCIN) 150 MG tablet TAKE 1 TABLET BY MOUTH 2 TIMES DAILY. 10/15/22   Heilingoetter, Cassandra L, PA-C  lisinopril (ZESTRIL) 5 MG tablet Take 1 tablet (5 mg total) by mouth daily. 06/17/22   [name], MD  nicotine (NICODERM CQ - DOSED IN MG/24 HOURS) 14 mg/24hr patch Place 1 patch (14 mg total) onto the skin daily. 08/13/22   Si Gaul, MD  prochlorperazine (COMPAZINE) 10 MG tablet Take 1 tablet (10 mg total) by mouth every 6 (six) hours as needed. Patient not taking: Reported on 07/16/2022 07/07/22   Heilingoetter, Cassandra L, PA-C  sucralfate (CARAFATE) 1 g tablet Take 1 tablet (1 g total) by mouth 4 (four) times daily -  with meals and at bedtime. Crush and dissolve in 10 mL's of warm water prior to swallowing, take 20 minutes prior to  meals 08/19/22   Antony Blackbird, MD  UNKNOWN TO PATIENT Take 1 tablet by mouth See admin instructions. Unnamed allergy tablet- Take 1 tablet by mouth once a day as needed for seasonal allergies    [provider]      VITAL SIGNS:  Blood pressure (!) 147/82, pulse (!) 107, temperature (!) 97.5 F (36.4 C), temperature source Oral, resp. rate 17, height 5' (1.524 m), weight 43.1 kg, SpO2 100%.  PHYSICAL EXAMINATION:  Physical Exam  GENERAL:  82 y.o.-year-old slim Caucasian female patient lying in the bed with no acute distress.  EYES: Pupils equal, round, reactive to light and accommodation. No scleral icterus. Extraocular muscles intact.  HEENT: Head atraumatic, normocephalic. Oropharynx and nasopharynx clear.  NECK:  Supple, no jugular venous distention. No thyroid enlargement, no tenderness.  LUNGS: Left basilar and midlung zone crackles with diminished breath sounds.  No use of accessory muscles of respiration.  CARDIOVASCULAR: Regular rate and rhythm, S1, S2 normal. No murmurs, rubs, or gallops.  ABDOMEN: Soft, nondistended, nontender. Bowel sounds present. No organomegaly or mass.  EXTREMITIES: No pedal edema, cyanosis, or clubbing.  NEUROLOGIC: Cranial nerves II  through XII are intact. Muscle strength 5/5 in all extremities. Sensation intact. Gait not checked.  PSYCHIATRIC: The patient is alert and oriented x 3.  Normal affect and good eye contact. SKIN: No obvious rash, lesion, or ulcer.   LABORATORY PANEL:   CBC Recent Labs  Lab 10/28/22 1420  WBC 10.5  HGB 11.9*  HCT 36.4  PLT 533*   ------------------------------------------------------------------------------------------------------------------  Chemistries  Recent Labs  Lab 10/28/22 1420  NA 126*  K 4.0  CL 91*  CO2 23  GLUCOSE 100*  BUN 11  CREATININE 0.33*  CALCIUM 9.1  MG 2.0  AST 37  ALT 35  ALKPHOS 115  BILITOT 0.4    ------------------------------------------------------------------------------------------------------------------  Cardiac Enzymes No results for input(s): "TROPONINI" in the last 168 hours. ------------------------------------------------------------------------------------------------------------------  RADIOLOGY:  DG Chest Port 1 View  Result Date: 10/28/2022 CLINICAL DATA:  82 year old female with lung cancer. Possible sepsis. Dizziness, lightheaded, off balance. Last chemotherapy on 09/06/2022. EXAM: PORTABLE CHEST 1 VIEW COMPARISON:  CTA chest today 1642 hours. Prior portable chest 06/12/2022. FINDINGS: Portable AP semi upright view at 1515 hours. Streaky and confluent left perihilar lung opacity, see CTA findings reported separately today. Underlying superior segment left lower lobe partially cavitary lung mass. Mildly lower lung volumes compared to May. Stable cardiac size and mediastinal contours. Visualized tracheal air column is within normal limits. No pneumothorax. Right lung appears stable. Stable visualized osseous structures. Paucity of bowel gas. IMPRESSION: Abnormal left lung opacity superimposed on known cavitary mass, multilobar pneumonia suspected as per CTA today reported separately. Electronically Signed   By: Odessa Fleming M.D.   On: 10/28/2022 17:02   CT Angio Chest PE W and/or Wo Contrast  Result Date: 10/28/2022 CLINICAL DATA:  82 year old female with lung cancer. Possible sepsis. Dizziness, lightheaded, off balance. Last chemotherapy on 09/06/2022. EXAM: CT ANGIOGRAPHY CHEST WITH CONTRAST TECHNIQUE: Multidetector CT imaging of the chest was performed using the standard protocol during bolus administration of intravenous contrast. Multiplanar CT image reconstructions and MIPs were obtained to evaluate the vascular anatomy. RADIATION DOSE REDUCTION: This exam was performed according to the departmental dose-optimization program which includes automated exposure control,  adjustment of the mA and/or kV according to patient size and/or use of iterative reconstruction technique. CONTRAST:  75mL OMNIPAQUE IOHEXOL 350 MG/ML SOLN COMPARISON:  Restaging chest CT 09/25/2022. FINDINGS: Cardiovascular: Excellent contrast bolus timing in the pulmonary arterial tree. No acute pulmonary artery filling defect. Calcified aortic atherosclerosis. Stable cardiac size, no cardiomegaly or pericardial effusion. Mediastinum/Nodes: No mediastinal mass or lymphadenopathy. Stable. Small gastric hiatal hernia. Lungs/Pleura: Chronic apical lung scarring. Centrilobular emphysema. Chronic anterior right upper lobe subpleural scarring is stable. Left lower lobe cavitary mass along the inferior aspect of the superior segment is stable and size and configuration since 09/25/2022 (series 14, image 77). However, there are extensive new peribronchial opacities in the anterior left upper lobe near the level of the mass, and scattered throughout the remaining left lower lobe and lingula. No right lung involvement. Major airways remain patent. Trace left pleural effusion appears increased. No right pleural effusion. Upper Abdomen: Grossly negative visible liver, gallbladder, spleen, pancreas, adrenal glands and bowel. Benign appearing renal cysts (no follow-up imaging recommended). Musculoskeletal: Stable. Osteopenia. Chronic right 3rd and 4th ribs minimally displaced fracture with sclerosis might be post radiation sequelae. Chronic right lateral 5th through 7th rib fractures which are likely posttraumatic. Mild to moderate T3 compression fracture, mild T12 superior endplate compression. No acute or suspicious osseous lesion identified. Review of the MIP images  confirms the above findings. IMPRESSION: 1. Negative for acute pulmonary embolus. 2. Positive for multilobar new left lung peribronchial opacity since 09/25/2022, superimposed on stable superior segment lower lobe cavitary mass. Sparing of the right lung favors  Multilobar Pneumonia. Trace left pleural effusion. Underlying, Emphysema (ICD10-J43.9), right upper lobe lung scarring. 3. Aortic Atherosclerosis (ICD10-I70.0). Osteopenia with chronic rib and compression fractures. Electronically Signed   By: Odessa Fleming M.D.   On: 10/28/2022 17:00      IMPRESSION AND PLAN:  Assessment and Plan: * Sepsis due to pneumonia [facility]) - The patient will be admitted to a medical telemetry bed. - Will continue antibiotic therapy with IV Rocephin and Zithromax. - We will continue IV hydration with normal saline. - Will follow blood cultures. - Mucolytic therapy and bronchodilator therapy will be provided.  Hyponatremia - This is acute on chronic. - This could be related to paraneoplastic syndrome with SIADH. - Would limit p.o. fluid intake and place her on gentle hydration with IV normal saline. - Will obtain hyponatremia workup.  Essential hypertension - We will continue her antihypertensive therapy.  GERD without esophagitis - We will continue Carafate.    DVT prophylaxis: Lovenox.  Advanced Care Planning:  Code Status: The patient is DNR only. Family Communication:  The plan of care was discussed in details with the patient (and family). I answered all questions. The patient agreed to proceed with the above mentioned plan. Further management will depend upon hospital course. Disposition Plan: Back to previous home environment Consults called: none.  All the records are reviewed and case discussed with ED provider.  Status is: Inpatient  At the time of the admission, it appears that the appropriate admission status for this patient is inpatient.  This is judged to be reasonable and necessary in order to provide the required intensity of service to ensure the patient's safety given the presenting symptoms, physical exam findings and initial radiographic and laboratory data in the context of comorbid conditions.  The patient requires inpatient status due to  high intensity of service, high risk of further deterioration and high frequency of surveillance required.  I certify that at the time of admission, it is my clinical judgment that the patient will require inpatient hospital care extending more than 2 midnights.                            Dispo: The patient is from: Home              Anticipated d/c is to: Home              Patient currently is not medically stable to d/c.              Difficult to place patient: No  Hannah Beat M.D on 10/28/2022 at 6:49 PM  [facility]   From 7 PM-7 AM, contact night-coverage [email or web address]  CC: Primary care physician; [name], MD

## 2022-10-28 NOTE — ED Triage Notes (Signed)
Pt reports last chemo treatment was August 10th, 2024. States she has been dizzy, lightheaded, and off balance since. Dr. Shirline Frees was notified today and told pt to come to ER due to symptoms lasting for so long. Denies pain

## 2022-10-28 NOTE — Telephone Encounter (Signed)
Called and spoke with patient's daughter Barbara Green regarding MyChart message sent on patient's behalf and recommendations from [facility] team (see message chain under Encounters, Patient Message titled "Lethargic and shortness of breath" for more details). Provided Barbara Green with direct call-back number [phone]) should she or patient have any other questions/concerns.

## 2022-10-28 NOTE — ED Notes (Signed)
Called lab to add on BNP. ?

## 2022-10-28 NOTE — Assessment & Plan Note (Signed)
-   This is acute on chronic. - This could be related to paraneoplastic syndrome with SIADH. - Would limit p.o. fluid intake and place her on gentle hydration with IV normal saline. - Will obtain hyponatremia workup.

## 2022-10-28 NOTE — ED Provider Notes (Signed)
[place] EMERGENCY DEPARTMENT AT [facility] Provider Note   CSN: [id number] Arrival date & time: 10/28/22  1323     History  Chief Complaint  Patient presents with   Dizziness    Barbara Green is a 82 y.o. female.   Dizziness Associated symptoms: shortness of breath and weakness (Generalized)   Patient presents for dizziness and fatigue.  Medical history includes HTN, breast cancer, lung cancer.  She underwent several cycles of chemotherapy and radiation starting in June.  Last treatment was 3 weeks ago.  She has had ongoing fatigue, dizziness, exercise intolerance, lightheadedness over the past several weeks.  She had an episode of shortness of breath, caused by exertion yesterday.  This was new for her.  She states that her appetite has been fine.  She was advised to not drink free water and has been drinking lots of Gatorade.  She denies any recent areas of pain.  Currently, at rest, she is asymptomatic.  Her oncologist is Dr. Arbutus Ped.     Home Medications Prior to Admission medications   Medication Sig Start Date End Date Taking? Authorizing Provider  BONINE 25 MG CHEW Chew 25 mg by mouth daily as needed (for vertigo).    [provider]  demeclocycline (DECLOMYCIN) 150 MG tablet TAKE 1 TABLET BY MOUTH 2 TIMES DAILY. 10/15/22   Heilingoetter, Cassandra L, PA-C  lisinopril (ZESTRIL) 5 MG tablet Take 1 tablet (5 mg total) by mouth daily. 06/17/22   [name], MD  nicotine (NICODERM CQ - DOSED IN MG/24 HOURS) 14 mg/24hr patch Place 1 patch (14 mg total) onto the skin daily. 08/13/22   Si Gaul, MD  prochlorperazine (COMPAZINE) 10 MG tablet Take 1 tablet (10 mg total) by mouth every 6 (six) hours as needed. Patient not taking: Reported on 07/16/2022 07/07/22   Heilingoetter, Cassandra L, PA-C  sucralfate (CARAFATE) 1 g tablet Take 1 tablet (1 g total) by mouth 4 (four) times daily -  with meals and at bedtime. Crush and dissolve in 10 mL's of warm  water prior to swallowing, take 20 minutes prior to meals 08/19/22   Antony Blackbird, MD  UNKNOWN TO PATIENT Take 1 tablet by mouth See admin instructions. Unnamed allergy tablet- Take 1 tablet by mouth once a day as needed for seasonal allergies    [provider]      Allergies    Patient has no known allergies.    Review of Systems   Review of Systems  Constitutional:  Positive for fatigue.  Respiratory:  Positive for cough and shortness of breath.   Musculoskeletal:  Positive for gait problem.  Neurological:  Positive for dizziness, weakness (Generalized) and light-headedness.  All other systems reviewed and are negative.   Physical Exam Updated Vital Signs BP 112/76   Pulse (!) 106   Temp 98.8 F (37.1 C) (Oral)   Resp 18   Ht 5' (1.524 m)   Wt 43.1 kg   SpO2 98%   BMI 18.55 kg/m  Physical Exam Vitals and nursing note reviewed.  Constitutional:      General: She is not in acute distress.    Appearance: Normal appearance. She is well-developed. She is not ill-appearing, toxic-appearing or diaphoretic.  HENT:     Head: Normocephalic and atraumatic.     Right Ear: External ear normal.     Left Ear: External ear normal.     Nose: Nose normal.     Mouth/Throat:     Mouth: Mucous  membranes are moist.  Eyes:     Extraocular Movements: Extraocular movements intact.     Conjunctiva/sclera: Conjunctivae normal.  Cardiovascular:     Rate and Rhythm: Normal rate and regular rhythm.     Heart sounds: No murmur heard. Pulmonary:     Effort: Pulmonary effort is normal. No respiratory distress.     Breath sounds: Normal breath sounds. No wheezing or rales.  Chest:     Chest wall: No tenderness.  Abdominal:     General: There is no distension.     Palpations: Abdomen is soft.     Tenderness: There is no abdominal tenderness.  Musculoskeletal:        General: No swelling. Normal range of motion.     Cervical back: Normal range of motion and neck supple.     Right  lower leg: No edema.     Left lower leg: No edema.  Skin:    General: Skin is warm and dry.     Coloration: Skin is not jaundiced or pale.  Neurological:     General: No focal deficit present.     Mental Status: She is alert and oriented to person, place, and time.     Cranial Nerves: No cranial nerve deficit.     Sensory: No sensory deficit.     Motor: No weakness.     Coordination: Coordination normal.  Psychiatric:        Mood and Affect: Mood normal.        Behavior: Behavior normal.     ED Results / Procedures / Treatments   Labs (all labs ordered are listed, but only abnormal results are displayed) Labs Reviewed  CBC - Abnormal; Notable for the following components:      Result Value   Hemoglobin 11.9 (*)    Platelets 533 (*)    All other components within normal limits  BASIC METABOLIC PANEL - Abnormal; Notable for the following components:   Sodium 126 (*)    Chloride 91 (*)    Glucose, Bld 100 (*)    Creatinine, Ser 0.33 (*)    All other components within normal limits  CBG MONITORING, ED - Abnormal; Notable for the following components:   Glucose-Capillary 111 (*)    All other components within normal limits  CULTURE, BLOOD (ROUTINE X 2)  CULTURE, BLOOD (ROUTINE X 2)  PROTIME-INR  MAGNESIUM  HEPATIC FUNCTION PANEL  URINALYSIS, W/ REFLEX TO CULTURE (INFECTION SUSPECTED)  BRAIN NATRIURETIC PEPTIDE  I-STAT CG4 LACTIC ACID, ED  TROPONIN I (HIGH SENSITIVITY)  TROPONIN I (HIGH SENSITIVITY)    EKG None  Radiology DG Chest Port 1 View  Result Date: 10/28/2022 CLINICAL DATA:  82 year old female with lung cancer. Possible sepsis. Dizziness, lightheaded, off balance. Last chemotherapy on 09/06/2022. EXAM: PORTABLE CHEST 1 VIEW COMPARISON:  CTA chest today 1642 hours. Prior portable chest 06/12/2022. FINDINGS: Portable AP semi upright view at 1515 hours. Streaky and confluent left perihilar lung opacity, see CTA findings reported separately today. Underlying  superior segment left lower lobe partially cavitary lung mass. Mildly lower lung volumes compared to May. Stable cardiac size and mediastinal contours. Visualized tracheal air column is within normal limits. No pneumothorax. Right lung appears stable. Stable visualized osseous structures. Paucity of bowel gas. IMPRESSION: Abnormal left lung opacity superimposed on known cavitary mass, multilobar pneumonia suspected as per CTA today reported separately. Electronically Signed   By: Odessa Fleming M.D.   On: 10/28/2022 17:02   CT Angio Chest PE W  and/or Wo Contrast  Result Date: 10/28/2022 CLINICAL DATA:  82 year old female with lung cancer. Possible sepsis. Dizziness, lightheaded, off balance. Last chemotherapy on 09/06/2022. EXAM: CT ANGIOGRAPHY CHEST WITH CONTRAST TECHNIQUE: Multidetector CT imaging of the chest was performed using the standard protocol during bolus administration of intravenous contrast. Multiplanar CT image reconstructions and MIPs were obtained to evaluate the vascular anatomy. RADIATION DOSE REDUCTION: This exam was performed according to the departmental dose-optimization program which includes automated exposure control, adjustment of the mA and/or kV according to patient size and/or use of iterative reconstruction technique. CONTRAST:  75mL OMNIPAQUE IOHEXOL 350 MG/ML SOLN COMPARISON:  Restaging chest CT 09/25/2022. FINDINGS: Cardiovascular: Excellent contrast bolus timing in the pulmonary arterial tree. No acute pulmonary artery filling defect. Calcified aortic atherosclerosis. Stable cardiac size, no cardiomegaly or pericardial effusion. Mediastinum/Nodes: No mediastinal mass or lymphadenopathy. Stable. Small gastric hiatal hernia. Lungs/Pleura: Chronic apical lung scarring. Centrilobular emphysema. Chronic anterior right upper lobe subpleural scarring is stable. Left lower lobe cavitary mass along the inferior aspect of the superior segment is stable and size and configuration since  09/25/2022 (series 14, image 77). However, there are extensive new peribronchial opacities in the anterior left upper lobe near the level of the mass, and scattered throughout the remaining left lower lobe and lingula. No right lung involvement. Major airways remain patent. Trace left pleural effusion appears increased. No right pleural effusion. Upper Abdomen: Grossly negative visible liver, gallbladder, spleen, pancreas, adrenal glands and bowel. Benign appearing renal cysts (no follow-up imaging recommended). Musculoskeletal: Stable. Osteopenia. Chronic right 3rd and 4th ribs minimally displaced fracture with sclerosis might be post radiation sequelae. Chronic right lateral 5th through 7th rib fractures which are likely posttraumatic. Mild to moderate T3 compression fracture, mild T12 superior endplate compression. No acute or suspicious osseous lesion identified. Review of the MIP images confirms the above findings. IMPRESSION: 1. Negative for acute pulmonary embolus. 2. Positive for multilobar new left lung peribronchial opacity since 09/25/2022, superimposed on stable superior segment lower lobe cavitary mass. Sparing of the right lung favors Multilobar Pneumonia. Trace left pleural effusion. Underlying, Emphysema (ICD10-J43.9), right upper lobe lung scarring. 3. Aortic Atherosclerosis (ICD10-I70.0). Osteopenia with chronic rib and compression fractures. Electronically Signed   By: Odessa Fleming M.D.   On: 10/28/2022 17:00    Procedures Procedures    Medications Ordered in ED Medications  cefTRIAXone (ROCEPHIN) 1 g in sodium chloride 0.9 % 100 mL IVPB (has no administration in time range)  azithromycin (ZITHROMAX) 500 mg in sodium chloride 0.9 % 250 mL IVPB (has no administration in time range)  lactated ringers bolus 1,000 mL (1,000 mLs Intravenous New Bag/Given 10/28/22 1437)  iohexol (OMNIPAQUE) 350 MG/ML injection 75 mL (75 mLs Intravenous Contrast Given 10/28/22 1639)    ED Course/ Medical Decision  Making/ A&P                                 Medical Decision Making Amount and/or Complexity of Data Reviewed Labs: ordered. Radiology: ordered.  Risk Prescription drug management.   This patient presents to the ED for concern of fatigue, this involves an extensive number of treatment options, and is a complaint that carries with it a high risk of complications and morbidity.  The differential diagnosis includes deconditioning, dehydration, infection, PE, metabolic derangements   Co morbidities that complicate the patient evaluation  HTN, breast cancer, lung cancer   Additional history obtained:  Additional history obtained from  N/A External records from outside source obtained and reviewed including EMR   Lab Tests:  I Ordered, and personally interpreted labs.  The pertinent results include: Baseline anemia, no leukocytosis, mild thrombocytosis, normal kidney function, slightly worsened hyponatremia from baseline, normal troponin, normal lactate   Imaging Studies ordered:  I ordered imaging studies including chest x-ray, CTA chest I independently visualized and interpreted imaging which showed multifocal pneumonia I agree with the radiologist interpretation   Cardiac Monitoring: / EKG:  The patient was maintained on a cardiac monitor.  I personally viewed and interpreted the cardiac monitored which showed an underlying rhythm of: Sinus rhythm  Problem List / ED Course / Critical interventions / Medication management  Patient presents for ongoing symptoms of fatigue, dizziness, lightheadedness.  These are worsened with exertion.  Patient presents for ongoing symptoms of fatigue, dizziness, lightheadedness.  These are worsened with exertion.  Vital signs on arrival are notable for tachycardia.  Despite this, she is well-appearing.  She is alert and oriented.  She is no focal neurologic deficits.  Breathing is unlabored.  Abdomen is soft and nontender.  EKG shows sinus  tachycardia.  IV fluids were ordered.  Workup was initiated.  Patient has some mild hyponatremia on lab work.  This does appear to be a chronic condition for her.  Anemia is baseline.  No leukocytosis is present.  Imaging studies showed findings consistent with multilobar pneumonia.  Antibiotics were initiated.  Patient to be admitted.  Care of patient was signed out to oncoming ED provider. I ordered medication including IV fluids for hydration; azithromycin and ceftriaxone for pneumonia Reevaluation of the patient after these medicines showed that the patient improved I have reviewed the patients home medicines and have made adjustments as needed   Social Determinants of Health:  Has access to outpatient care         Final Clinical Impression(s) / ED Diagnoses Final diagnoses:  Multifocal pneumonia    Rx / DC Orders ED Discharge Orders     None         Gloris Manchester, MD 10/28/22 1715

## 2022-10-29 ENCOUNTER — Other Ambulatory Visit: Payer: Self-pay

## 2022-10-29 ENCOUNTER — Other Ambulatory Visit (HOSPITAL_COMMUNITY): Payer: Self-pay | Admitting: Student

## 2022-10-29 ENCOUNTER — Other Ambulatory Visit: Payer: Self-pay | Admitting: Radiology

## 2022-10-29 DIAGNOSIS — I10 Essential (primary) hypertension: Secondary | ICD-10-CM | POA: Diagnosis not present

## 2022-10-29 DIAGNOSIS — E871 Hypo-osmolality and hyponatremia: Secondary | ICD-10-CM | POA: Diagnosis not present

## 2022-10-29 DIAGNOSIS — K219 Gastro-esophageal reflux disease without esophagitis: Secondary | ICD-10-CM | POA: Diagnosis not present

## 2022-10-29 DIAGNOSIS — J189 Pneumonia, unspecified organism: Secondary | ICD-10-CM | POA: Diagnosis not present

## 2022-10-29 LAB — CBC WITH DIFFERENTIAL/PLATELET
Abs Immature Granulocytes: 0.04 10*3/uL (ref 0.00–0.07)
Basophils Absolute: 0.1 10*3/uL (ref 0.0–0.1)
Basophils Relative: 1 %
Eosinophils Absolute: 0.1 10*3/uL (ref 0.0–0.5)
Eosinophils Relative: 1 %
HCT: 29.9 % — ABNORMAL LOW (ref 36.0–46.0)
Hemoglobin: 9.5 g/dL — ABNORMAL LOW (ref 12.0–15.0)
Immature Granulocytes: 0 %
Lymphocytes Relative: 22 %
Lymphs Abs: 2.2 10*3/uL (ref 0.7–4.0)
MCH: 29.4 pg (ref 26.0–34.0)
MCHC: 31.8 g/dL (ref 30.0–36.0)
MCV: 92.6 fL (ref 80.0–100.0)
Monocytes Absolute: 1.7 10*3/uL — ABNORMAL HIGH (ref 0.1–1.0)
Monocytes Relative: 17 %
Neutro Abs: 5.8 10*3/uL (ref 1.7–7.7)
Neutrophils Relative %: 59 %
Platelets: 435 10*3/uL — ABNORMAL HIGH (ref 150–400)
RBC: 3.23 MIL/uL — ABNORMAL LOW (ref 3.87–5.11)
RDW: 14.7 % (ref 11.5–15.5)
WBC: 9.8 10*3/uL (ref 4.0–10.5)
nRBC: 0 % (ref 0.0–0.2)

## 2022-10-29 LAB — BASIC METABOLIC PANEL
Anion gap: 9 (ref 5–15)
Anion gap: 10 (ref 5–15)
BUN: <5 mg/dL — ABNORMAL LOW (ref 8–23)
BUN: 6 mg/dL — ABNORMAL LOW (ref 8–23)
CO2: 25 mmol/L (ref 22–32)
CO2: 24 mmol/L (ref 22–32)
Calcium: 8.3 mg/dL — ABNORMAL LOW (ref 8.9–10.3)
Calcium: 8.4 mg/dL — ABNORMAL LOW (ref 8.9–10.3)
Chloride: 93 mmol/L — ABNORMAL LOW (ref 98–111)
Chloride: 94 mmol/L — ABNORMAL LOW (ref 98–111)
Creatinine, Ser: <0.3 mg/dL — ABNORMAL LOW (ref 0.44–1.00)
Creatinine, Ser: <0.3 mg/dL — ABNORMAL LOW (ref 0.44–1.00)
Glucose, Bld: 86 mg/dL (ref 70–99)
Glucose, Bld: 99 mg/dL (ref 70–99)
Potassium: 3.5 mmol/L (ref 3.5–5.1)
Potassium: 4 mmol/L (ref 3.5–5.1)
Sodium: 127 mmol/L — ABNORMAL LOW (ref 135–145)
Sodium: 128 mmol/L — ABNORMAL LOW (ref 135–145)

## 2022-10-29 LAB — OSMOLALITY, URINE: Osmolality, Ur: 544 mosm/kg (ref 300–900)

## 2022-10-29 LAB — CBC
HCT: 27.4 % — ABNORMAL LOW (ref 36.0–46.0)
Hemoglobin: 8.9 g/dL — ABNORMAL LOW (ref 12.0–15.0)
MCH: 29.4 pg (ref 26.0–34.0)
MCHC: 32.5 g/dL (ref 30.0–36.0)
MCV: 90.4 fL (ref 80.0–100.0)
Platelets: 462 10*3/uL — ABNORMAL HIGH (ref 150–400)
RBC: 3.03 MIL/uL — ABNORMAL LOW (ref 3.87–5.11)
RDW: 14.9 % (ref 11.5–15.5)
WBC: 8.8 10*3/uL (ref 4.0–10.5)
nRBC: 0 % (ref 0.0–0.2)

## 2022-10-29 LAB — PROTIME-INR
INR: 1 (ref 0.8–1.2)
Prothrombin Time: 13.2 s (ref 11.4–15.2)

## 2022-10-29 LAB — PROCALCITONIN: Procalcitonin: <0.1 ng/mL

## 2022-10-29 LAB — CORTISOL-AM, BLOOD: Cortisol - AM: 8.2 ug/dL (ref 6.7–22.6)

## 2022-10-29 LAB — OSMOLALITY: Osmolality: 272 mosm/kg — ABNORMAL LOW (ref 275–295)

## 2022-10-29 MED ORDER — AZITHROMYCIN 500 MG PO TABS: 500.0000 mg | ORAL_TABLET | Freq: Every day | ORAL | 0 refills | Status: AC

## 2022-10-29 MED ORDER — ONDANSETRON HCL 4 MG PO TABS: 4.0000 mg | ORAL_TABLET | Freq: Four times a day (QID) | ORAL | 0 refills | Status: AC | PRN

## 2022-10-29 MED ORDER — CEFDINIR 300 MG PO CAPS: 300.0000 mg | ORAL_CAPSULE | Freq: Two times a day (BID) | ORAL | 0 refills | Status: AC

## 2022-10-29 MED ORDER — GUAIFENESIN ER 600 MG PO TB12: 600.0000 mg | ORAL_TABLET | Freq: Two times a day (BID) | ORAL | 0 refills | Status: AC

## 2022-10-29 MED ORDER — ACETAMINOPHEN 325 MG PO TABS: 650.0000 mg | ORAL_TABLET | Freq: Four times a day (QID) | ORAL | 0 refills | Status: DC | PRN

## 2022-10-29 NOTE — TOC Initial Note (Signed)
Transition of Care [facility]) - Initial/Assessment Note    Patient Details  Name: Barbara Green MRN: [id number] Date of Birth: 1940/06/07  Transition of [facility]) CM/SW Contact:    Lanier Clam, RN Phone Number: 10/29/2022, 12:12 PM  Clinical Narrative: patient indep w/care,drives on own,will drive home on own @ d/c. No d/c needs.                  Expected Discharge Plan: Home/Self Care Barriers to Discharge: Continued Medical Work up   Patient Goals and CMS Choice Patient states their goals for this hospitalization and ongoing recovery are:: Home CMS Medicare.gov Compare Post Acute Care list provided to:: Patient Choice offered to / list presented to : Patient [place] ownership interest in [facility].provided to:: Patient    Expected Discharge Plan and Services   Discharge Planning Services: CM Consult Post Acute Care Choice: Resumption of Svcs/PTA Provider Living arrangements for the past 2 months: Single Family Home                                      Prior Living Arrangements/Services Living arrangements for the past 2 months: Single Family Home Lives with:: Self Patient language and need for interpreter reviewed:: Yes Do you feel safe going back to the place where you live?: Yes      Need for Family Participation in Patient Care: Yes (Comment) Care giver support system in place?: Yes (comment) Current home services: DME (cane,3n1) Criminal Activity/Legal Involvement Pertinent to Current Situation/Hospitalization: No - Comment as needed  Activities of Daily Living   ADL Screening (condition at time of admission) Independently performs ADLs?: Yes (appropriate for developmental age) Is the patient deaf or have difficulty hearing?: Yes Does the patient have difficulty seeing, even when wearing glasses/contacts?: No Does the patient have difficulty concentrating, remembering, or making decisions?: No  Permission Sought/Granted Permission sought  to share information with : Case Manager Permission granted to share information with : Yes, Verbal Permission Granted  Share Information with NAME: Case manager           Emotional Assessment Appearance:: Appears stated age Attitude/Demeanor/Rapport: Gracious Affect (typically observed): Accepting Orientation: : Oriented to Self, Oriented to Place, Oriented to  Time, Oriented to Situation Alcohol / Substance Use: Not Applicable Psych Involvement: No (comment)  Admission diagnosis:  Sepsis due to pneumonia (HCC) [J18.9, A41.9] Multifocal pneumonia [J18.9] Patient Active Problem List   Diagnosis Date Noted   Sepsis due to pneumonia (HCC) 10/28/2022   Multifocal pneumonia 10/28/2022   GERD without esophagitis 10/28/2022   Stage III squamous cell carcinoma of left lung (HCC) 07/07/2022   Goals of care, counseling/discussion 07/07/2022   Hyponatremia 05/29/2022   Mass of lower lobe of left lung 05/29/2022   Lung mass 05/29/2022   Malignant neoplasm of upper-inner quadrant of right breast in female, estrogen receptor positive (HCC) 04/20/2019   Tobacco dependence 02/08/2019   Unspecified lump in the right breast, upper inner quadrant  02/08/2019   Essential hypertension 02/08/2019   Tachycardia 02/08/2019   PCP:  [name], MD Pharmacy:   [facility] - [place], [place] - [address] [place] [address] Phone: [phone] Fax: [phone]     Social Determinants of Health (SDOH) Social History: SDOH Screenings   Food Insecurity: No Food Insecurity (10/28/2022)  Housing: Low Risk  (10/28/2022)  Transportation Needs: No  Transportation Needs (10/28/2022)  Utilities: Not At Risk (10/28/2022)  Depression (PHQ2-9): Low Risk  (02/18/2022)  Financial Resource Strain: Low Risk  (02/13/2022)  Physical Activity: Inactive (02/13/2022)  Stress: No Stress Concern Present (02/13/2022)  Tobacco Use: High Risk (10/28/2022)   SDOH Interventions:      Readmission Risk Interventions     No data to display

## 2022-10-29 NOTE — Plan of Care (Signed)
  Problem: Clinical Measurements: Goal: Diagnostic test results will improve Outcome: Progressing   Problem: Education: Goal: Knowledge of General Education information will improve Description: Including pain rating scale, medication(s)/side effects and non-pharmacologic comfort measures Outcome: Progressing   Problem: Clinical Measurements: Goal: Cardiovascular complication will be avoided Outcome: Progressing   Problem: Respiratory: Goal: Ability to maintain adequate ventilation will improve Outcome: Adequate for Discharge

## 2022-10-29 NOTE — Care Management Obs Status (Signed)
MEDICARE OBSERVATION STATUS NOTIFICATION   Patient Details  Name: Barbara Green MRN: [id number] Date of Birth: 1940/08/06   Medicare Observation Status Notification Given:  Yes    MahabirOlegario Messier, RN 10/29/2022, 2:28 PM

## 2022-10-29 NOTE — Plan of Care (Signed)

## 2022-10-29 NOTE — Care Management CC44 (Signed)
Condition Code 44 Documentation Completed  Patient Details  Name: Barbara Green MRN: [id number] Date of Birth: 1940-03-27   Condition Code 44 given:  Yes Patient signature on Condition Code 44 notice:  Yes Documentation of 2 MD's agreement:  Yes Code 44 added to claim:  Yes    Lanier Clam, RN 10/29/2022, 2:29 PM

## 2022-10-29 NOTE — Discharge Summary (Signed)
Physician Discharge Summary   Patient: Barbara Green MRN: [id number] DOB: 12-06-1940  Admit date:     10/28/2022  Discharge date: 10/29/22  Discharge Physician: Marguerita Merles, DO   PCP: [name], MD   Recommendations at discharge:   Follow-up with PCP within 1 to 2 weeks repeat CBC, CMP, mag, Phos within 1 week Follow-up with medical oncology within 1 to 2 weeks  Discharge Diagnoses: Principal Problem:   Sepsis due to pneumonia [facility]) Active Problems:   Multifocal pneumonia   Hyponatremia   Essential hypertension   GERD without esophagitis  Resolved Problems:   * No resolved hospital problems. [facility] Course: HPI per Dr. Valente David Barbara Green is a 82 y.o. Caucasian female with medical history significant for breast and lung cancer, status post breast lumpectomy and radiotherapy for breast and lung cancer, and essential hypertension, who presented to the emergency room with acute onset of dizziness and lightheadedness.  She underwent several cycles of chemotherapy and radiotherapy starting in June and her last treatment was 3 weeks ago.  She has been having generalized fatigue and tiredness with associated dizziness and exercise intolerance as well as lightheadedness over the last few weeks.  She admitted to dyspnea mainly on exertion and dry cough without wheezing  that is new to her symptoms over the last few days.  No fever or chills.  No nausea or vomiting or abdominal pain or diarrhea or constipation.  No dysuria, oliguria or hematuria or flank pain.  No chest pain or palpitations.  No bleeding diathesis.   ED Course: When she came to the ER, heart rate was 133 with otherwise normal vital signs.  Later on respiratory rate was 29.  Labs revealed hemoglobin 11.9 hematocrit 36.4 with platelets of 533.  Lactic acid 1.3 and CMP revealed hyponatremia 126, hypochloremia of 91, with otherwise normal values. EKG as reviewed by me :  EKG showed sinus tachycardia with a rate of  134 with poor R wave progression. Imaging: Portable chest x-ray showed abnormal left lung opacity superimposed on known cavitating mass concerning for multilobar pneumonia. Chest CTA revealed the following: 1. Negative for acute pulmonary embolus.   2. Positive for multilobar new left lung peribronchial opacity since 09/25/2022, superimposed on stable superior segment lower lobe cavitary mass. Sparing of the right lung favors Multilobar Pneumonia. Trace left pleural effusion. Underlying, Emphysema (ICD10-J43.9), right upper lobe lung scarring.   3. Aortic Atherosclerosis (ICD10-I70.0). Osteopenia with chronic rib and compression fractures.   The patient was given 1 L bolus of IV lactated Ringer as well as IV Rocephin and Zithromax.  She will be admitted to a medical telemetry bed for further evaluation and management  **Interim History Improved quicker than expected and was weaned off of supplemental oxygen via nasal cannula.  Sepsis physiology resolved and she felt back to her baseline and ambulated without any dizziness or shortness of breath.  She is medically stable to be discharged only to follow-up with PCP and her medical oncologist outpatient setting  Assessment and Plan:  PNA -Sepsis is ruled out - She was admitted to a telemetry bed but improved quicker than expected so she was downgraded to observation - Will continue antibiotic therapy with IV Rocephin and Zithromax. - Continue IV fluid hydration with normal saline - Will follow blood cultures. -CTA Chest done and showed "Negative for acute pulmonary embolus. Positive for multilobar new left lung peribronchial opacity since 09/25/2022, superimposed on stable superior segment lower lobe cavitary mass. Sparing  of the right lung favors Multilobar Pneumonia. Trace left pleural effusion. Underlying, Emphysema (ICD10-J43.9), right upper lobe lung scarring. Aortic Atherosclerosis (ICD10-I70.0). Osteopenia with chronic rib and  compression fractures." - Mucolytic therapy and bronchodilator therapy will be provided. -Her sepsis physiology resolved and WBC was normal and her focus stone level was less than 0.10 -She improved quicker than expected and did not desaturate and was not dizzy on discharge and will need to follow-up with PCP and repeat chest x-ray in 3 to 6 weeks   Essential hypertension - We will continue her antihypertensive therapy.   GERD without esophagitis/ GIG Prophylaxis - We will continue Carafate.  Hyponatremia -Appears to be acute on chronic and could be secondary to paraneoplastic syndrome with SIADH; current Na+ Trend: Recent Labs  Lab 10/09/22 1336 10/28/22 1420 10/29/22 0445 10/29/22 1144  NA 129* 126* 127* 128*  -Continue to Monitor and Trend and Repeat CMP in the AM  Normocytic Anemia -Hgb/Hct Trend: Recent Labs  Lab 10/09/22 1336 10/28/22 1420 10/29/22 0445 10/29/22 0920  HGB 10.9* 11.9* 8.9* 9.5*  HCT 33.0* 36.4 27.4* 29.9*  MCV 90.4 89.4 90.4 92.6  -Check Anemia Panel in outpatient setting -Continue to Monitor for S/Sx of Bleeding; No overt bleeding noted -Repeat CBC in the AM   Lung Cancer  -Outpatient follow-up with medical oncology  Consultants: None Procedures performed: As delineated above Disposition: Home Diet recommendation:  Discharge Diet Orders (From admission, onward)     Start     Ordered   10/29/22 0000  Diet - low sodium heart healthy        10/29/22 1417           Regular diet DISCHARGE MEDICATION: Allergies as of 10/29/2022   No Known Allergies      Medication List     STOP taking these medications    prochlorperazine 10 MG tablet Commonly known as: COMPAZINE   sucralfate 1 g tablet Commonly known as: Carafate       TAKE these medications    acetaminophen 325 MG tablet Commonly known as: TYLENOL Take 2 tablets (650 mg total) by mouth every 6 (six) hours as needed for mild pain (or Fever >/= 101).   azithromycin 500  MG tablet Commonly known as: Zithromax Take 1 tablet (500 mg total) by mouth daily for 4 days.   Bonine 25 MG Chew Generic drug: Meclizine HCl Chew 25-50 mg by mouth daily as needed (for vertigo).   cefdinir 300 MG capsule Commonly known as: OMNICEF Take 1 capsule (300 mg total) by mouth 2 (two) times daily for 4 days.   demeclocycline 150 MG tablet Commonly known as: DECLOMYCIN TAKE 1 TABLET BY MOUTH 2 TIMES DAILY.   guaiFENesin 600 MG 12 hr tablet Commonly known as: MUCINEX Take 1 tablet (600 mg total) by mouth 2 (two) times daily for 5 days.   lisinopril 5 MG tablet Commonly known as: ZESTRIL Take 1 tablet (5 mg total) by mouth daily.   nicotine 14 mg/24hr patch Commonly known as: NICODERM CQ - dosed in mg/24 hours Place 1 patch (14 mg total) onto the skin daily.   ondansetron 4 MG tablet Commonly known as: ZOFRAN Take 1 tablet (4 mg total) by mouth every 6 (six) hours as needed for nausea.        Discharge Exam: Filed Weights   10/28/22 1337  Weight: 43.1 kg   Vitals:   10/29/22 1219 10/29/22 1225  BP: 105/69 105/69  Pulse: 96 98  Resp: 16  16  Temp: 98.6 F (37 C) 98.6 F (37 C)  SpO2:  99%   Examination: Physical Exam:  Constitutional: Thin elderly Caucasian female no acute distress Respiratory: Diminished to auscultation bilaterally with some coarse breath sounds, no wheezing, rales, rhonchi or crackles. Normal respiratory effort and patient is not tachypenic. No accessory muscle use.  Unlabored breathing Cardiovascular: RRR, no murmurs / rubs / gallops. S1 and S2 auscultated.   Abdomen: Soft, non-tender, non-distended.  Bowel sounds positive.  GU: Deferred. Musculoskeletal: No clubbing / cyanosis of digits/nails. No joint deformity upper and lower extremities.  Skin: No rashes, lesions, ulcers on limited skin evaluation. No induration; Warm and dry.  Neurologic: CN 2-12 grossly intact with no focal deficits. Romberg sign and cerebellar reflexes not  assessed.  Psychiatric: Normal judgment and insight. Alert and oriented x 3. Normal mood and appropriate affect.   Condition at discharge: stable  The results of significant diagnostics from this hospitalization (including imaging, microbiology, ancillary and laboratory) are listed below for reference.   Imaging Studies: DG Chest Port 1 View  Result Date: 10/28/2022 CLINICAL DATA:  82 year old female with lung cancer. Possible sepsis. Dizziness, lightheaded, off balance. Last chemotherapy on 09/06/2022. EXAM: PORTABLE CHEST 1 VIEW COMPARISON:  CTA chest today 1642 hours. Prior portable chest 06/12/2022. FINDINGS: Portable AP semi upright view at 1515 hours. Streaky and confluent left perihilar lung opacity, see CTA findings reported separately today. Underlying superior segment left lower lobe partially cavitary lung mass. Mildly lower lung volumes compared to May. Stable cardiac size and mediastinal contours. Visualized tracheal air column is within normal limits. No pneumothorax. Right lung appears stable. Stable visualized osseous structures. Paucity of bowel gas. IMPRESSION: Abnormal left lung opacity superimposed on known cavitary mass, multilobar pneumonia suspected as per CTA today reported separately. Electronically Signed   By: Odessa Fleming M.D.   On: 10/28/2022 17:02   CT Angio Chest PE W and/or Wo Contrast  Result Date: 10/28/2022 CLINICAL DATA:  82 year old female with lung cancer. Possible sepsis. Dizziness, lightheaded, off balance. Last chemotherapy on 09/06/2022. EXAM: CT ANGIOGRAPHY CHEST WITH CONTRAST TECHNIQUE: Multidetector CT imaging of the chest was performed using the standard protocol during bolus administration of intravenous contrast. Multiplanar CT image reconstructions and MIPs were obtained to evaluate the vascular anatomy. RADIATION DOSE REDUCTION: This exam was performed according to the departmental dose-optimization program which includes automated exposure control,  adjustment of the mA and/or kV according to patient size and/or use of iterative reconstruction technique. CONTRAST:  75mL OMNIPAQUE IOHEXOL 350 MG/ML SOLN COMPARISON:  Restaging chest CT 09/25/2022. FINDINGS: Cardiovascular: Excellent contrast bolus timing in the pulmonary arterial tree. No acute pulmonary artery filling defect. Calcified aortic atherosclerosis. Stable cardiac size, no cardiomegaly or pericardial effusion. Mediastinum/Nodes: No mediastinal mass or lymphadenopathy. Stable. Small gastric hiatal hernia. Lungs/Pleura: Chronic apical lung scarring. Centrilobular emphysema. Chronic anterior right upper lobe subpleural scarring is stable. Left lower lobe cavitary mass along the inferior aspect of the superior segment is stable and size and configuration since 09/25/2022 (series 14, image 77). However, there are extensive new peribronchial opacities in the anterior left upper lobe near the level of the mass, and scattered throughout the remaining left lower lobe and lingula. No right lung involvement. Major airways remain patent. Trace left pleural effusion appears increased. No right pleural effusion. Upper Abdomen: Grossly negative visible liver, gallbladder, spleen, pancreas, adrenal glands and bowel. Benign appearing renal cysts (no follow-up imaging recommended). Musculoskeletal: Stable. Osteopenia. Chronic right 3rd and 4th ribs minimally displaced fracture  with sclerosis might be post radiation sequelae. Chronic right lateral 5th through 7th rib fractures which are likely posttraumatic. Mild to moderate T3 compression fracture, mild T12 superior endplate compression. No acute or suspicious osseous lesion identified. Review of the MIP images confirms the above findings. IMPRESSION: 1. Negative for acute pulmonary embolus. 2. Positive for multilobar new left lung peribronchial opacity since 09/25/2022, superimposed on stable superior segment lower lobe cavitary mass. Sparing of the right lung favors  Multilobar Pneumonia. Trace left pleural effusion. Underlying, Emphysema (ICD10-J43.9), right upper lobe lung scarring. 3. Aortic Atherosclerosis (ICD10-I70.0). Osteopenia with chronic rib and compression fractures. Electronically Signed   By: Odessa Fleming M.D.   On: 10/28/2022 17:00    Microbiology: Results for orders placed or performed during the hospital encounter of 10/28/22  Blood Culture (routine x 2)     Status: None (Preliminary result)   Collection Time: 10/28/22  2:25 PM   Specimen: BLOOD RIGHT ARM  Result Value Ref Range Status   Specimen Description   Final    BLOOD RIGHT ARM Performed at [facility], [address] [place], [place] [address]    Special Requests   Final    BOTTLES DRAWN AEROBIC AND ANAEROBIC Blood Culture adequate volume Performed at [facility], [address] [place], [place] [address]    Culture   Final    NO GROWTH 3 DAYS Performed at [facility], [address] [place], [place] [address]    Report Status PENDING  Incomplete  Blood Culture (routine x 2)     Status: None (Preliminary result)   Collection Time: 10/28/22  2:44 PM   Specimen: BLOOD RIGHT ARM  Result Value Ref Range Status   Specimen Description   Final    BLOOD RIGHT ARM Performed at [facility], [address], [place], [place] [address]    Special Requests   Final    BOTTLES DRAWN AEROBIC AND ANAEROBIC Blood Culture results may not be optimal due to an excessive volume of blood received in culture bottles Performed at [facility], [address], [place], [place] [address]    Culture   Final    NO GROWTH 3 DAYS Performed at [facility], [address], [place], [place] [address]    Report Status PENDING  Incomplete   Labs: CBC: Recent Labs  Lab 10/28/22 1420 10/29/22 0445 10/29/22 0920  WBC 10.5 8.8 9.8  NEUTROABS  --   --  5.8  HGB 11.9* 8.9* 9.5*  HCT 36.4 27.4* 29.9*  MCV 89.4 90.4 92.6  PLT 533*  462* 435*   Basic Metabolic Panel: Recent Labs  Lab 10/28/22 1420 10/29/22 0445 10/29/22 1144  NA 126* 127* 128*  K 4.0 3.5 4.0  CL 91* 93* 94*  CO2 23 25 24   GLUCOSE 100* 86 99  BUN 11 <5* 6*  CREATININE 0.33* <0.30* <0.30*  CALCIUM 9.1 8.3* 8.4*  MG 2.0  --   --    Liver Function Tests: Recent Labs  Lab 10/28/22 1420  AST 37  ALT 35  ALKPHOS 115  BILITOT 0.4  PROT 7.3  ALBUMIN 3.7   CBG: Recent Labs  Lab 10/28/22 1418  GLUCAP 111*   Discharge time spent: greater than 30 minutes.  Signed: Marguerita Merles, DO [facility] 10/31/2022

## 2022-10-29 NOTE — Progress Notes (Signed)
Patient oxygen saturations on room air were 92% and above during ambulation to nurse's station and back. Pt tolerated well, denies SOB or dizziness.

## 2022-10-29 NOTE — Hospital Course (Addendum)
HPI per Dr. Valente David Barbara Green is a 82 y.o. Caucasian female with medical history significant for breast and lung cancer, status post breast lumpectomy and radiotherapy for breast and lung cancer, and essential hypertension, who presented to the emergency room with acute onset of dizziness and lightheadedness.  She underwent several cycles of chemotherapy and radiotherapy starting in June and her last treatment was 3 weeks ago.  She has been having generalized fatigue and tiredness with associated dizziness and exercise intolerance as well as lightheadedness over the last few weeks.  She admitted to dyspnea mainly on exertion and dry cough without wheezing  that is new to her symptoms over the last few days.  No fever or chills.  No nausea or vomiting or abdominal pain or diarrhea or constipation.  No dysuria, oliguria or hematuria or flank pain.  No chest pain or palpitations.  No bleeding diathesis.   ED Course: When she came to the ER, heart rate was 133 with otherwise normal vital signs.  Later on respiratory rate was 29.  Labs revealed hemoglobin 11.9 hematocrit 36.4 with platelets of 533.  Lactic acid 1.3 and CMP revealed hyponatremia 126, hypochloremia of 91, with otherwise normal values. EKG as reviewed by me :  EKG showed sinus tachycardia with a rate of 134 with poor R wave progression. Imaging: Portable chest x-ray showed abnormal left lung opacity superimposed on known cavitating mass concerning for multilobar pneumonia. Chest CTA revealed the following: 1. Negative for acute pulmonary embolus.   2. Positive for multilobar new left lung peribronchial opacity since 09/25/2022, superimposed on stable superior segment lower lobe cavitary mass. Sparing of the right lung favors Multilobar Pneumonia. Trace left pleural effusion. Underlying, Emphysema (ICD10-J43.9), right upper lobe lung scarring.   3. Aortic Atherosclerosis (ICD10-I70.0). Osteopenia with chronic rib and compression  fractures.   The patient was given 1 L bolus of IV lactated Ringer as well as IV Rocephin and Zithromax.  She will be admitted to a medical telemetry bed for further evaluation and management  **Interim History Improved quicker than expected and was weaned off of supplemental oxygen via nasal cannula.  Sepsis physiology resolved and she felt back to her baseline and ambulated without any dizziness or shortness of breath.  She is medically stable to be discharged only to follow-up with PCP and her medical oncologist outpatient setting  Assessment and Plan:  PNA -Sepsis is ruled out - She was admitted to a telemetry bed but improved quicker than expected so she was downgraded to observation - Will continue antibiotic therapy with IV Rocephin and Zithromax. - Continue IV fluid hydration with normal saline - Will follow blood cultures. -CTA Chest done and showed "Negative for acute pulmonary embolus. Positive for multilobar new left lung peribronchial opacity since 09/25/2022, superimposed on stable superior segment lower lobe cavitary mass. Sparing of the right lung favors Multilobar Pneumonia. Trace left pleural effusion. Underlying, Emphysema (ICD10-J43.9), right upper lobe lung scarring. Aortic Atherosclerosis (ICD10-I70.0). Osteopenia with chronic rib and compression fractures." - Mucolytic therapy and bronchodilator therapy will be provided. -Her sepsis physiology resolved and WBC was normal and her focus stone level was less than 0.10 -She improved quicker than expected and did not desaturate and was not dizzy on discharge and will need to follow-up with PCP and repeat chest x-ray in 3 to 6 weeks   Essential hypertension - We will continue her antihypertensive therapy.   GERD without esophagitis/ GIG Prophylaxis - We will continue Carafate.  Hyponatremia -Appears to  be acute on chronic and could be secondary to paraneoplastic syndrome with SIADH; current Na+ Trend: Recent Labs  Lab  10/09/22 1336 10/28/22 1420 10/29/22 0445 10/29/22 1144  NA 129* 126* 127* 128*  -Continue to Monitor and Trend and Repeat CMP in the AM  Normocytic Anemia -Hgb/Hct Trend: Recent Labs  Lab 10/09/22 1336 10/28/22 1420 10/29/22 0445 10/29/22 0920  HGB 10.9* 11.9* 8.9* 9.5*  HCT 33.0* 36.4 27.4* 29.9*  MCV 90.4 89.4 90.4 92.6  -Check Anemia Panel in outpatient setting -Continue to Monitor for S/Sx of Bleeding; No overt bleeding noted -Repeat CBC in the AM   Lung Cancer  -Outpatient follow-up with medical oncology

## 2022-10-30 ENCOUNTER — Other Ambulatory Visit (HOSPITAL_COMMUNITY): Payer: Medicare HMO

## 2022-10-30 ENCOUNTER — Telehealth: Payer: Self-pay

## 2022-10-30 ENCOUNTER — Ambulatory Visit (HOSPITAL_COMMUNITY): Payer: Medicare HMO

## 2022-10-30 ENCOUNTER — Telehealth: Payer: Self-pay | Admitting: Medical Oncology

## 2022-10-30 NOTE — Telephone Encounter (Signed)
I returned dtr call. Pt still wants port today. I told her Dr Elby Showers IR said to r/s because of her recent pneumonia and antibiotic treatment.

## 2022-10-30 NOTE — Transitions of Care (Post Inpatient/ED Visit) (Signed)
10/30/2022  Name: Barbara Green MRN: [id number] DOB: Jul 04, 1940  Today's TOC FU Call Status: Today's TOC FU Call Status:: Unsuccessful Call (1st Attempt) Unsuccessful Call (1st Attempt) Date: 10/30/22  Attempted to reach the patient regarding the most recent Inpatient/ED visit.  Follow Up Plan: Additional outreach attempts will be made to reach the patient to complete the Transitions of Care (Post Inpatient/ED visit) call.   Signature  Robyne Peers, RN

## 2022-10-30 NOTE — Telephone Encounter (Signed)
Port a cath procedure cancelled,. I lvm on pts home phone and dtrs mobile phone that her port a cath procedure is cancelled and to return my call and let me know if she wants to r/s it.

## 2022-11-02 LAB — CULTURE, BLOOD (ROUTINE X 2)
Culture: NO GROWTH
Culture: NO GROWTH
Special Requests: ADEQUATE

## 2022-11-03 ENCOUNTER — Telehealth: Payer: Self-pay

## 2022-11-03 NOTE — Transitions of Care (Post Inpatient/ED Visit) (Signed)
11/03/2022  Name: Barbara Green MRN: [id number] DOB: November 30, 1940  Today's TOC FU Call Status: Today's TOC FU Call Status:: Successful TOC FU Call Completed Unsuccessful Call (1st Attempt) Date: 10/30/22 [facility] FU Call Complete Date: 11/03/22 Patient's Name and Date of Birth confirmed.  Transition Care Management Follow-up Telephone Call Date of Discharge: 10/29/22 Discharge Facility: [facility]) Type of Discharge: Inpatient Admission Primary Inpatient Discharge Diagnosis:: sepsis How have you been since you were released from the hospital?: Better Any questions or concerns?: No  Items Reviewed: Did you receive and understand the discharge instructions provided?: Yes Medications obtained,verified, and reconciled?: Partial Review Completed Reason for Partial Mediation Review: She said she has all of her medications and has completed the course of antibiotics.  She did not have any questions about the med regime and did not need to review the med list. Any new allergies since your discharge?: No Dietary orders reviewed?: Yes Type of Diet Ordered:: heart healthy, low sodium Do you have support at home?: Yes People in Home: alone Name of Support/Comfort Primary Source: has neighbors that check on her.  Medications Reviewed Today: Medications Reviewed Today   Medications were not reviewed in this encounter     Home Care and Equipment/Supplies: Were Home Health Services Ordered?: No Any new equipment or medical supplies ordered?: No  Functional Questionnaire: Do you need assistance with bathing/showering or dressing?: No Do you need assistance with meal preparation?: No Do you need assistance with eating?: No Do you have difficulty maintaining continence: No Do you need assistance with getting out of bed/getting out of a chair/moving?: No Do you have difficulty managing or taking your medications?: No  Follow up appointments reviewed: PCP Follow-up appointment confirmed?:  No (She said she will  call Dr Andrey Campanile when she is ready for an appt.) MD Provider Line Number:[phone] Given: No Specialist Hospital Follow-up appointment confirmed?: Yes Date of Specialist follow-up appointment?: 11/06/22 Follow-Up Specialty Provider:: oncology Do you need transportation to your follow-up appointment?: No Do you understand care options if your condition(s) worsen?: Yes-patient verbalized understanding    SIGNATURE Robyne Peers, RN

## 2022-11-04 NOTE — Progress Notes (Unsigned)
[facility] OFFICE PROGRESS NOTE  Barbara Skeans, Barbara Green [address] [place] [address]  DIAGNOSIS: Stage IIIA (T2b, N2, M0) non-small cell lung cancer, squamous cell carcinoma presented with large left lower lobe lung mass in addition to left hilar and suspicious mediastinal lymphadenopathy diagnosed in May 2024.   Molecular studies by [id number] showed no actionable mutations and PD-L1 expression of 2%.  PRIOR THERAPY: A course of concurrent chemoradiation with weekly carboplatin for AUC of 2 and paclitaxel 45 Mg/M2.  First dose July 21, 2022.  Status post 7 cycles.  Last dose was given on 09/01/2022.   CURRENT THERAPY:  Consolidation treatment with immunotherapy with Imfinzi 1500 Mg IV every 4 weeks.  First dose 10/09/2022   INTERVAL HISTORY: Barbara Green 82 y.o. female returns to the clinic today for follow-up visit.  The patient was last seen in the clinic by Dr. Arbutus Ped on 9//24.  The patient had completed a course of concurrent chemoradiation and August 2024.  She then was started on consolidation immunotherapy last month.  She called in the interval after her last appointment reporting continuing to feel dysuria and fatigued with lack of energy.  She also had worsening cough that was just as bad as it was when she was first diagnosed.  She also reports increased shortness of breath with exertion.  Due to the symptoms reported, recommended emergency room evaluation.  Emergency room, she was tachycardic and tachypneic.  She had a chest x-ray showing lung opacity superimposed on known cavitary mass concerning for multilobar pneumonia.  CT scan showed possible multilobar pneumonia.  He was treated with IV fluids, azithromycin, Rocephin.  Discharge recommends follow-up chest x-ray in 3 to 6-week with PCP.  Since being discharged, she is feeling ***this time.  Today she denies any fever, chills, or night sweats.  Appetite?  Weight?  Fatigue?  Shortness of breath?   Supplemental oxygen?  Cough?  Denies any chest pain or hemoptysis.  Denies any nausea, vomiting, diarrhea, or constipation.  Denies any headache or visual changes.  Denies any rashes or skin changes.  He is rescheduled for Port-A-Cath on 11/12/2022 ***SEND ELMA CREAM. she is here today for evaluation and repeat blood work before considering starting cycle #2.  He was hospitalized from 10/1 to 10/29/2022  Port-a-cath.  MEDICAL HISTORY: Past Medical History:  Diagnosis Date   History of radiation therapy 06/29/19-07/29/19   Right breast IMRT- Dr. Roselind Messier   History of radiation therapy    Left Lung- 07/24/22-09/08/22- Dr. Antony Blackbird   Hypertension     ALLERGIES:  has No Known Allergies.  MEDICATIONS:  Current Outpatient Medications  Medication Sig Dispense Refill   acetaminophen (TYLENOL) 325 MG tablet Take 2 tablets (650 mg total) by mouth every 6 (six) hours as needed for mild pain (or Fever >/= 101). 20 tablet 0   BONINE 25 MG CHEW Chew 25-50 mg by mouth daily as needed (for vertigo).     demeclocycline (DECLOMYCIN) 150 MG tablet TAKE 1 TABLET BY MOUTH 2 TIMES DAILY. 60 tablet 0   lisinopril (ZESTRIL) 5 MG tablet Take 1 tablet (5 mg total) by mouth daily. 90 tablet 1   nicotine (NICODERM CQ - DOSED IN MG/24 HOURS) 14 mg/24hr patch Place 1 patch (14 mg total) onto the skin daily. 30 patch 0   ondansetron (ZOFRAN) 4 MG tablet Take 1 tablet (4 mg total) by mouth every 6 (six) hours as needed for nausea. 20 tablet 0   No current  facility-administered medications for this visit.    SURGICAL HISTORY:  Past Surgical History:  Procedure Laterality Date   BREAST LUMPECTOMY WITH SENTINEL LYMPH NODE BIOPSY Right 05/17/2019   Procedure: RIGHT BREAST LUMPECTOMY WITH SENTINEL LYMPH NODE BIOPSY;  Surgeon: Abigail Miyamoto, Barbara Green;  Location: [facility];  Service: General;  Laterality: Right;   BRONCHIAL BIOPSY  06/12/2022   Procedure: BRONCHIAL BIOPSIES;  Surgeon: Omar Person, Barbara Green;   Location: [facility] ENDOSCOPY;  Service: Pulmonary;;   BRONCHIAL NEEDLE ASPIRATION BIOPSY  06/12/2022   Procedure: BRONCHIAL NEEDLE ASPIRATION BIOPSIES;  Surgeon: Omar Person, Barbara Green;  Location: [facility] ENDOSCOPY;  Service: Pulmonary;;   FIDUCIAL MARKER PLACEMENT  06/12/2022   Procedure: FIDUCIAL MARKER PLACEMENT;  Surgeon: Omar Person, Barbara Green;  Location: [facility] ENDOSCOPY;  Service: Pulmonary;;   NO PAST SURGERIES     VIDEO BRONCHOSCOPY WITH ENDOBRONCHIAL ULTRASOUND N/A 06/12/2022   Procedure: VIDEO BRONCHOSCOPY WITH ENDOBRONCHIAL ULTRASOUND;  Surgeon: Omar Person, Barbara Green;  Location: [facility] ENDOSCOPY;  Service: Pulmonary;  Laterality: N/A;    REVIEW OF SYSTEMS:   Review of Systems  Constitutional: Negative for appetite change, chills, fatigue, fever and unexpected weight change.  HENT:   Negative for mouth sores, nosebleeds, sore throat and trouble swallowing.   Eyes: Negative for eye problems and icterus.  Respiratory: Negative for cough, hemoptysis, shortness of breath and wheezing.   Cardiovascular: Negative for chest pain and leg swelling.  Gastrointestinal: Negative for abdominal pain, constipation, diarrhea, nausea and vomiting.  Genitourinary: Negative for bladder incontinence, difficulty urinating, dysuria, frequency and hematuria.   Musculoskeletal: Negative for back pain, gait problem, neck pain and neck stiffness.  Skin: Negative for itching and rash.  Neurological: Negative for dizziness, extremity weakness, gait problem, headaches, light-headedness and seizures.  Hematological: Negative for adenopathy. Does not bruise/bleed easily.  Psychiatric/Behavioral: Negative for confusion, depression and sleep disturbance. The patient is not nervous/anxious.     PHYSICAL EXAMINATION:  There were no vitals taken for this visit.  ECOG PERFORMANCE STATUS: {CHL ONC ECOG [id number]  Physical Exam  Constitutional: Oriented to person, place, and time and well-developed, well-nourished, and in no  distress. No distress.  HENT:  Head: Normocephalic and atraumatic.  Mouth/Throat: Oropharynx is clear and moist. No oropharyngeal exudate.  Eyes: Conjunctivae are normal. Right eye exhibits no discharge. Left eye exhibits no discharge. No scleral icterus.  Neck: Normal range of motion. Neck supple.  Cardiovascular: Normal rate, regular rhythm, normal heart sounds and intact distal pulses.   Pulmonary/Chest: Effort normal and breath sounds normal. No respiratory distress. No wheezes. No rales.  Abdominal: Soft. Bowel sounds are normal. Exhibits no distension and no mass. There is no tenderness.  Musculoskeletal: Normal range of motion. Exhibits no edema.  Lymphadenopathy:    No cervical adenopathy.  Neurological: Alert and oriented to person, place, and time. Exhibits normal muscle tone. Gait normal. Coordination normal.  Skin: Skin is warm and dry. No rash noted. Not diaphoretic. No erythema. No pallor.  Psychiatric: Mood, memory and judgment normal.  Vitals reviewed.  LABORATORY DATA: Lab Results  Component Value Date   WBC 9.8 10/29/2022   HGB 9.5 (L) 10/29/2022   HCT 29.9 (L) 10/29/2022   MCV 92.6 10/29/2022   PLT 435 (H) 10/29/2022      Chemistry      Component Value Date/Time   NA 128 (L) 10/29/2022 1144   NA 124 (L) 05/26/2022 1323   K 4.0 10/29/2022 1144   CL 94 (L) 10/29/2022 1144   CO2 24  10/29/2022 1144   BUN 6 (L) 10/29/2022 1144   BUN 7 (L) 05/26/2022 1323   CREATININE <0.30 (L) 10/29/2022 1144   CREATININE 0.32 (L) 10/09/2022 1336      Component Value Date/Time   CALCIUM 8.4 (L) 10/29/2022 1144   ALKPHOS 115 10/28/2022 1420   AST 37 10/28/2022 1420   AST 22 10/09/2022 1336   ALT 35 10/28/2022 1420   ALT 15 10/09/2022 1336   BILITOT 0.4 10/28/2022 1420   BILITOT 0.4 10/09/2022 1336       RADIOGRAPHIC STUDIES:  DG Chest Port 1 View  Result Date: 10/28/2022 CLINICAL DATA:  82 year old female with lung cancer. Possible sepsis. Dizziness, lightheaded,  off balance. Last chemotherapy on 09/06/2022. EXAM: PORTABLE CHEST 1 VIEW COMPARISON:  CTA chest today 1642 hours. Prior portable chest 06/12/2022. FINDINGS: Portable AP semi upright view at 1515 hours. Streaky and confluent left perihilar lung opacity, see CTA findings reported separately today. Underlying superior segment left lower lobe partially cavitary lung mass. Mildly lower lung volumes compared to May. Stable cardiac size and mediastinal contours. Visualized tracheal air column is within normal limits. No pneumothorax. Right lung appears stable. Stable visualized osseous structures. Paucity of bowel gas. IMPRESSION: Abnormal left lung opacity superimposed on known cavitary mass, multilobar pneumonia suspected as per CTA today reported separately. Electronically Signed   By: Odessa Fleming M.D.   On: 10/28/2022 17:02   CT Angio Chest PE W and/or Wo Contrast  Result Date: 10/28/2022 CLINICAL DATA:  82 year old female with lung cancer. Possible sepsis. Dizziness, lightheaded, off balance. Last chemotherapy on 09/06/2022. EXAM: CT ANGIOGRAPHY CHEST WITH CONTRAST TECHNIQUE: Multidetector CT imaging of the chest was performed using the standard protocol during bolus administration of intravenous contrast. Multiplanar CT image reconstructions and MIPs were obtained to evaluate the vascular anatomy. RADIATION DOSE REDUCTION: This exam was performed according to the departmental dose-optimization program which includes automated exposure control, adjustment of the mA and/or kV according to patient size and/or use of iterative reconstruction technique. CONTRAST:  75mL OMNIPAQUE IOHEXOL 350 MG/ML SOLN COMPARISON:  Restaging chest CT 09/25/2022. FINDINGS: Cardiovascular: Excellent contrast bolus timing in the pulmonary arterial tree. No acute pulmonary artery filling defect. Calcified aortic atherosclerosis. Stable cardiac size, no cardiomegaly or pericardial effusion. Mediastinum/Nodes: No mediastinal mass or  lymphadenopathy. Stable. Small gastric hiatal hernia. Lungs/Pleura: Chronic apical lung scarring. Centrilobular emphysema. Chronic anterior right upper lobe subpleural scarring is stable. Left lower lobe cavitary mass along the inferior aspect of the superior segment is stable and size and configuration since 09/25/2022 (series 14, image 77). However, there are extensive new peribronchial opacities in the anterior left upper lobe near the level of the mass, and scattered throughout the remaining left lower lobe and lingula. No right lung involvement. Major airways remain patent. Trace left pleural effusion appears increased. No right pleural effusion. Upper Abdomen: Grossly negative visible liver, gallbladder, spleen, pancreas, adrenal glands and bowel. Benign appearing renal cysts (no follow-up imaging recommended). Musculoskeletal: Stable. Osteopenia. Chronic right 3rd and 4th ribs minimally displaced fracture with sclerosis might be post radiation sequelae. Chronic right lateral 5th through 7th rib fractures which are likely posttraumatic. Mild to moderate T3 compression fracture, mild T12 superior endplate compression. No acute or suspicious osseous lesion identified. Review of the MIP images confirms the above findings. IMPRESSION: 1. Negative for acute pulmonary embolus. 2. Positive for multilobar new left lung peribronchial opacity since 09/25/2022, superimposed on stable superior segment lower lobe cavitary mass. Sparing of the right lung favors Multilobar Pneumonia.  Trace left pleural effusion. Underlying, Emphysema (ICD10-J43.9), right upper lobe lung scarring. 3. Aortic Atherosclerosis (ICD10-I70.0). Osteopenia with chronic rib and compression fractures. Electronically Signed   By: Odessa Fleming M.D.   On: 10/28/2022 17:00     ASSESSMENT/PLAN:  This is a very pleasant 82 year old Caucasian female diagnosed with stage IIIa (T2 would be, N2, M0) non-small cell lung cancer, squamous cell carcinoma.  She  presented with a left lower lobe lung mass in addition to ipsilateral infrahilar nodal metastasis.  She was diagnosed in June 2024.  She is not a good candidate for surgical resection.  She underwent a course of concurrent chemoradiation with weekly carboplatin for AUC of 2 and paclitaxel 45 mg/m.  She is status post 7 cycles.  The last dose was given on 09/01/2022.  She is currently undergoing consolidation immunotherapy with Imfinzi 1500 mg IV every 4 weeks.  She is status post 1 cycle.  She had called the clinic not feeling well in the interval and was found to have multilobar pneumonia.  She was treated with ***Ceftin and azithromycin and returned to her baseline***?  The patient was seen with Dr. Arbutus Ped today.  Labs were reviewed.  Recommend that she ***with cycle #2 today scheduled.  We will see her back for follow-up visit in 4 weeks for evaluation and repeat blood work before undergoing cycle #3.  The patient was advised to call immediately if she has any concerning symptoms in the interval. The patient voices understanding of current disease status and treatment options and is in agreement with the current care plan. All questions were answered. The patient knows to call the clinic with any problems, questions or concerns. We can certainly see the patient much sooner if necessary         No orders of the defined types were placed in this encounter.    I spent {CHL ONC TIME VISIT - ZDGUY:[id number]} counseling the patient face to face. The total time spent in the appointment was {CHL ONC TIME VISIT - GLOVF:[id number]}.  Kaetlin Bullen L Lazara Grieser, PA-C 11/04/22

## 2022-11-06 ENCOUNTER — Inpatient Hospital Stay: Payer: Medicare HMO

## 2022-11-06 ENCOUNTER — Telehealth: Payer: Self-pay

## 2022-11-06 ENCOUNTER — Inpatient Hospital Stay: Payer: Medicare HMO | Admitting: Physician Assistant

## 2022-11-06 ENCOUNTER — Inpatient Hospital Stay: Payer: Medicare HMO | Attending: Physician Assistant

## 2022-11-06 VITALS — BP 126/67 | HR 106 | Temp 97.6°F | Resp 18 | Wt 95.1 lb

## 2022-11-06 VITALS — HR 89 | Resp 16

## 2022-11-06 DIAGNOSIS — Z5112 Encounter for antineoplastic immunotherapy: Secondary | ICD-10-CM | POA: Diagnosis not present

## 2022-11-06 DIAGNOSIS — C3492 Malignant neoplasm of unspecified part of left bronchus or lung: Secondary | ICD-10-CM

## 2022-11-06 DIAGNOSIS — Z923 Personal history of irradiation: Secondary | ICD-10-CM | POA: Diagnosis not present

## 2022-11-06 DIAGNOSIS — C3432 Malignant neoplasm of lower lobe, left bronchus or lung: Secondary | ICD-10-CM | POA: Insufficient documentation

## 2022-11-06 DIAGNOSIS — E871 Hypo-osmolality and hyponatremia: Secondary | ICD-10-CM | POA: Diagnosis not present

## 2022-11-06 DIAGNOSIS — Z5111 Encounter for antineoplastic chemotherapy: Secondary | ICD-10-CM | POA: Diagnosis present

## 2022-11-06 DIAGNOSIS — Z79899 Other long term (current) drug therapy: Secondary | ICD-10-CM | POA: Insufficient documentation

## 2022-11-06 LAB — CBC WITH DIFFERENTIAL (CANCER CENTER ONLY)
Abs Immature Granulocytes: 0.02 10*3/uL (ref 0.00–0.07)
Basophils Absolute: 0.1 10*3/uL (ref 0.0–0.1)
Basophils Relative: 1 %
Eosinophils Absolute: 0.3 10*3/uL (ref 0.0–0.5)
Eosinophils Relative: 3 %
HCT: 33.4 % — ABNORMAL LOW (ref 36.0–46.0)
Hemoglobin: 10.7 g/dL — ABNORMAL LOW (ref 12.0–15.0)
Immature Granulocytes: 0 %
Lymphocytes Relative: 19 %
Lymphs Abs: 1.8 10*3/uL (ref 0.7–4.0)
MCH: 29.1 pg (ref 26.0–34.0)
MCHC: 32 g/dL (ref 30.0–36.0)
MCV: 90.8 fL (ref 80.0–100.0)
Monocytes Absolute: 1.4 10*3/uL — ABNORMAL HIGH (ref 0.1–1.0)
Monocytes Relative: 14 %
Neutro Abs: 6.2 10*3/uL (ref 1.7–7.7)
Neutrophils Relative %: 63 %
Platelet Count: 400 10*3/uL (ref 150–400)
RBC: 3.68 MIL/uL — ABNORMAL LOW (ref 3.87–5.11)
RDW: 14.3 % (ref 11.5–15.5)
WBC Count: 9.8 10*3/uL (ref 4.0–10.5)
nRBC: 0 % (ref 0.0–0.2)

## 2022-11-06 LAB — CMP (CANCER CENTER ONLY)
ALT: 28 U/L (ref 0–44)
AST: 27 U/L (ref 15–41)
Albumin: 3.6 g/dL (ref 3.5–5.0)
Alkaline Phosphatase: 84 U/L (ref 38–126)
Anion gap: 9 (ref 5–15)
BUN: 5 mg/dL — ABNORMAL LOW (ref 8–23)
CO2: 26 mmol/L (ref 22–32)
Calcium: 9.3 mg/dL (ref 8.9–10.3)
Chloride: 94 mmol/L — ABNORMAL LOW (ref 98–111)
Creatinine: 0.39 mg/dL — ABNORMAL LOW (ref 0.44–1.00)
GFR, Estimated: >60 mL/min (ref >60)
Glucose, Bld: 90 mg/dL (ref 70–99)
Potassium: 3.4 mmol/L — ABNORMAL LOW (ref 3.5–5.1)
Sodium: 129 mmol/L — ABNORMAL LOW (ref 135–145)
Total Bilirubin: 0.5 mg/dL (ref 0.3–1.2)
Total Protein: 6.6 g/dL (ref 6.5–8.1)

## 2022-11-06 MED ORDER — SODIUM CHLORIDE 0.9 % IV SOLN
1500.0000 mg | Freq: Once | INTRAVENOUS | Status: AC
  Administered 2022-11-06: 1500 mg via INTRAVENOUS
  Filled 2022-11-06: qty 30

## 2022-11-06 MED ORDER — DEMECLOCYCLINE HCL 150 MG PO TABS: 150.0000 mg | ORAL_TABLET | Freq: Two times a day (BID) | ORAL | 2 refills | Status: AC

## 2022-11-06 MED ORDER — SODIUM CHLORIDE 1 G PO TABS
1.0000 g | ORAL_TABLET | Freq: Two times a day (BID) | ORAL | 1 refills | Status: DC
Start: 2022-11-06 — End: 2022-11-06

## 2022-11-06 MED ORDER — LIDOCAINE-PRILOCAINE 2.5-2.5 % EX CREA
1.0000 | TOPICAL_CREAM | CUTANEOUS | 2 refills | Status: AC | PRN
Start: 2022-11-06 — End: ?

## 2022-11-06 MED ORDER — SODIUM CHLORIDE 1 G PO TABS
1.0000 g | ORAL_TABLET | Freq: Every day | ORAL | 1 refills | Status: DC
Start: 2022-11-06 — End: 2022-12-04

## 2022-11-06 MED ORDER — SODIUM CHLORIDE 0.9 % IV SOLN: Freq: Once | INTRAVENOUS | Status: AC

## 2022-11-06 NOTE — Patient Instructions (Signed)
[facility]  Discharge Instructions: Thank you for choosing [facility] to provide your oncology and hematology care.   If you have a lab appointment with the Cancer Center, please go directly to the Cancer Center and check in at the registration area.   Wear comfortable clothing and clothing appropriate for easy access to any Portacath or PICC line.   We strive to give you quality time with your provider. You may need to reschedule your appointment if you arrive late (15 or more minutes).  Arriving late affects you and other patients whose appointments are after yours.  Also, if you miss three or more appointments without notifying the office, you may be dismissed from the clinic at the provider's discretion.      For prescription refill requests, have your pharmacy contact our office and allow 72 hours for refills to be completed.    Today you received the following chemotherapy and/or immunotherapy agents imfinzi      To help prevent nausea and vomiting after your treatment, we encourage you to take your nausea medication as directed.  BELOW ARE SYMPTOMS THAT SHOULD BE REPORTED IMMEDIATELY: *FEVER GREATER THAN 100.4 F (38 C) OR HIGHER *CHILLS OR SWEATING *NAUSEA AND VOMITING THAT IS NOT CONTROLLED WITH YOUR NAUSEA MEDICATION *UNUSUAL SHORTNESS OF BREATH *UNUSUAL BRUISING OR BLEEDING *URINARY PROBLEMS (pain or burning when urinating, or frequent urination) *BOWEL PROBLEMS (unusual diarrhea, constipation, pain near the anus) TENDERNESS IN MOUTH AND THROAT WITH OR WITHOUT PRESENCE OF ULCERS (sore throat, sores in mouth, or a toothache) UNUSUAL RASH, SWELLING OR PAIN  UNUSUAL VAGINAL DISCHARGE OR ITCHING   Items with * indicate a potential emergency and should be followed up as soon as possible or go to the Emergency Department if any problems should occur.  Please show the CHEMOTHERAPY ALERT CARD or IMMUNOTHERAPY ALERT CARD at check-in  to the Emergency Department and triage nurse.  Should you have questions after your visit or need to cancel or reschedule your appointment, please contact [facility]  Dept: [phone]  and follow the prompts.  Office hours are 8:00 a.m. to 4:30 p.m. Monday - Friday. Please note that voicemails left after 4:00 p.m. may not be returned until the following business day.  We are closed weekends and major holidays. You have access to a nurse at all times for urgent questions. Please call the main number to the clinic Dept: [phone] and follow the prompts.   For any non-urgent questions, you may also contact your provider using MyChart. We now offer e-Visits for anyone 18 and older to request care online for non-urgent symptoms. For details visit [email or web address].   Also download the MyChart app! Go to the app store, search "MyChart", open the app, select [facility], and log in with your MyChart username and password.   

## 2022-11-06 NOTE — Telephone Encounter (Signed)
Notified Patient by voicemail of prior authorization approval for Lidocaine-Prilocaine 2.5% Cream (EMLA). Medication is approve through 02/06/2023.

## 2022-11-07 ENCOUNTER — Other Ambulatory Visit: Payer: Self-pay | Admitting: Physician Assistant

## 2022-11-07 ENCOUNTER — Telehealth: Payer: Self-pay | Admitting: Medical Oncology

## 2022-11-07 DIAGNOSIS — R2689 Other abnormalities of gait and mobility: Secondary | ICD-10-CM

## 2022-11-07 DIAGNOSIS — C3492 Malignant neoplasm of unspecified part of left bronchus or lung: Secondary | ICD-10-CM

## 2022-11-07 MED ORDER — ONDANSETRON HCL 8 MG PO TABS
8.0000 mg | ORAL_TABLET | Freq: Three times a day (TID) | ORAL | 2 refills | Status: AC | PRN
Start: 2022-11-07 — End: ?

## 2022-11-07 NOTE — Telephone Encounter (Signed)
Dtr reported Lauralye is lightheaded , doesn't feel good , vomited and  asking why is she taking Demeclocycline. "Every medicine she takes lists lightheaded and dizzy as a side effect". I went through the 3 meds the pt takes on a daily basis . She has Bonine prn for vertigo.  Educated about Demeclocycline and salt tabs. I talked to pt and she said , " I think I vomited because of the sandwich I ate". I told her that could be and that immunotherapy  does not cause nausea generally.   She did take prochlorperazine the day after chemo because a nurse suggested it -She was not nauseated at the time. I told pt to avoid prochlorperazine if she is nauseated and take ondansetron.   Pt said she has not had any falls or near falls. She decline an MRI brain.

## 2022-11-10 ENCOUNTER — Telehealth: Payer: Self-pay | Admitting: Family Medicine

## 2022-11-10 NOTE — Telephone Encounter (Signed)
Called pt to schedule HFU with provider; pt refused to scheduled and said she is "having surgery soon and will not make any other appts after that"

## 2022-11-10 NOTE — Telephone Encounter (Signed)
Barbara Green called sttd patient was admitted into the hospital on 10/1-10/5. Barbara Green wants provider to make sure patient is keeping up with her f/u appt as she was not able to get in contact with her

## 2022-11-11 ENCOUNTER — Other Ambulatory Visit: Payer: Self-pay | Admitting: Radiology

## 2022-11-11 NOTE — Consult Note (Signed)
Chief Complaint: Patient was seen in consultation today for Port-A-Cath placement  Referring Physician(s): Mohamed,Mohamed  Supervising Physician: Mir, Mauri Reading  Patient Status: [facility] - Out-pt  History of Present Illness: Barbara Green is an 82 y.o. female with past medical history of hypertension and recently diagnosed squamous cell carcinoma of the left lung.  She is scheduled today for Port-A-Cath placement to assist with treatment.  She was recently treated for pneumonia.  Past Medical History:  Diagnosis Date   History of radiation therapy 06/29/19-07/29/19   Right breast IMRT- Dr. Roselind Messier   History of radiation therapy    Left Lung- 07/24/22-09/08/22- Dr. Antony Blackbird   Hypertension     Past Surgical History:  Procedure Laterality Date   BREAST LUMPECTOMY WITH SENTINEL LYMPH NODE BIOPSY Right 05/17/2019   Procedure: RIGHT BREAST LUMPECTOMY WITH SENTINEL LYMPH NODE BIOPSY;  Surgeon: Abigail Miyamoto, MD;  Location: [facility];  Service: General;  Laterality: Right;   BRONCHIAL BIOPSY  06/12/2022   Procedure: BRONCHIAL BIOPSIES;  Surgeon: Omar Person, MD;  Location: [facility] ENDOSCOPY;  Service: Pulmonary;;   BRONCHIAL NEEDLE ASPIRATION BIOPSY  06/12/2022   Procedure: BRONCHIAL NEEDLE ASPIRATION BIOPSIES;  Surgeon: Omar Person, MD;  Location: [facility] ENDOSCOPY;  Service: Pulmonary;;   FIDUCIAL MARKER PLACEMENT  06/12/2022   Procedure: FIDUCIAL MARKER PLACEMENT;  Surgeon: Omar Person, MD;  Location: [facility] ENDOSCOPY;  Service: Pulmonary;;   NO PAST SURGERIES     VIDEO BRONCHOSCOPY WITH ENDOBRONCHIAL ULTRASOUND N/A 06/12/2022   Procedure: VIDEO BRONCHOSCOPY WITH ENDOBRONCHIAL ULTRASOUND;  Surgeon: Omar Person, MD;  Location: [facility] ENDOSCOPY;  Service: Pulmonary;  Laterality: N/A;    Allergies: Patient has no known allergies.  Medications: Prior to Admission medications   Medication Sig Start Date End Date Taking? Authorizing Provider  acetaminophen  (TYLENOL) 325 MG tablet Take 2 tablets (650 mg total) by mouth every 6 (six) hours as needed for mild pain (or Fever >/= 101). 10/29/22   Sheikh, Omair Latif, DO  BONINE 25 MG CHEW Chew 25-50 mg by mouth daily as needed (for vertigo).    [provider]  demeclocycline (DECLOMYCIN) 150 MG tablet Take 1 tablet (150 mg total) by mouth 2 (two) times daily. 11/06/22   Heilingoetter, Cassandra L, PA-C  lidocaine-prilocaine (EMLA) cream Apply 1 Application topically as needed. 11/06/22   Heilingoetter, Cassandra L, PA-C  lisinopril (ZESTRIL) 5 MG tablet Take 1 tablet (5 mg total) by mouth daily. 06/17/22   [name], MD  nicotine (NICODERM CQ - DOSED IN MG/24 HOURS) 14 mg/24hr patch Place 1 patch (14 mg total) onto the skin daily. 08/13/22   Si Gaul, MD  ondansetron (ZOFRAN) 4 MG tablet Take 1 tablet (4 mg total) by mouth every 6 (six) hours as needed for nausea. 10/29/22   Sheikh, Omair Latif, DO  ondansetron (ZOFRAN) 8 MG tablet Take 1 tablet (8 mg total) by mouth every 8 (eight) hours as needed for nausea or vomiting. 11/07/22   Heilingoetter, Cassandra L, PA-C  sodium chloride 1 g tablet Take 1 tablet (1 g total) by mouth daily. 11/06/22   Heilingoetter, Cassandra L, PA-C     Family History  Problem Relation Age of Onset   Alzheimer's disease Mother    Heart disease Father    Alcohol abuse Father     Social History   Socioeconomic History   Marital status: Widowed    Spouse name: Not on file   Number of children: 3   Years of education:  Not on file   Highest education level: 12th grade  Occupational History   Not on file  Tobacco Use   Smoking status: Some Days    Current packs/day: 0.00    Average packs/day: 0.3 packs/day for 30.0 years (9.9 ttl pk-yrs)    Types: Cigarettes    Start date: 05/1992    Last attempt to quit: 05/2022    Years since quitting: 0.4    Passive exposure: Current   Smokeless tobacco: Never  Vaping Use   Vaping status: Never Used   Substance and Sexual Activity   Alcohol use: Yes    Comment: occasionally a glass wine   Drug use: Never   Sexual activity: Not on file  Other Topics Concern   Not on file  Social History Narrative   Not on file   Social Determinants of Health   Financial Resource Strain: Low Risk  (02/13/2022)   Overall Financial Resource Strain (CARDIA)    Difficulty of Paying Living Expenses: Not hard at all  Food Insecurity: No Food Insecurity (10/28/2022)   Hunger Vital Sign    Worried About Running Out of Food in the Last Year: Never true    Ran Out of Food in the Last Year: Never true  Transportation Needs: No Transportation Needs (10/28/2022)   PRAPARE - Administrator, Civil Service (Medical): No    Lack of Transportation (Non-Medical): No  Physical Activity: Inactive (02/13/2022)   Exercise Vital Sign    Days of Exercise per Week: 0 days    Minutes of Exercise per Session: 0 min  Stress: No Stress Concern Present (02/13/2022)   [employer] of Occupational Health - Occupational Stress Questionnaire    Feeling of Stress : Not at all  Social Connections: Not on file      Review of Systems  Vital Signs:   Code Status:   Advance Care Plan: No documents on file   Physical Exam  Imaging: DG Chest Port 1 View  Result Date: 10/28/2022 CLINICAL DATA:  82 year old female with lung cancer. Possible sepsis. Dizziness, lightheaded, off balance. Last chemotherapy on 09/06/2022. EXAM: PORTABLE CHEST 1 VIEW COMPARISON:  CTA chest today 1642 hours. Prior portable chest 06/12/2022. FINDINGS: Portable AP semi upright view at 1515 hours. Streaky and confluent left perihilar lung opacity, see CTA findings reported separately today. Underlying superior segment left lower lobe partially cavitary lung mass. Mildly lower lung volumes compared to May. Stable cardiac size and mediastinal contours. Visualized tracheal air column is within normal limits. No pneumothorax. Right lung appears  stable. Stable visualized osseous structures. Paucity of bowel gas. IMPRESSION: Abnormal left lung opacity superimposed on known cavitary mass, multilobar pneumonia suspected as per CTA today reported separately. Electronically Signed   By: Odessa Fleming M.D.   On: 10/28/2022 17:02   CT Angio Chest PE W and/or Wo Contrast  Result Date: 10/28/2022 CLINICAL DATA:  82 year old female with lung cancer. Possible sepsis. Dizziness, lightheaded, off balance. Last chemotherapy on 09/06/2022. EXAM: CT ANGIOGRAPHY CHEST WITH CONTRAST TECHNIQUE: Multidetector CT imaging of the chest was performed using the standard protocol during bolus administration of intravenous contrast. Multiplanar CT image reconstructions and MIPs were obtained to evaluate the vascular anatomy. RADIATION DOSE REDUCTION: This exam was performed according to the departmental dose-optimization program which includes automated exposure control, adjustment of the mA and/or kV according to patient size and/or use of iterative reconstruction technique. CONTRAST:  75mL OMNIPAQUE IOHEXOL 350 MG/ML SOLN COMPARISON:  Restaging chest CT 09/25/2022. FINDINGS:  Cardiovascular: Excellent contrast bolus timing in the pulmonary arterial tree. No acute pulmonary artery filling defect. Calcified aortic atherosclerosis. Stable cardiac size, no cardiomegaly or pericardial effusion. Mediastinum/Nodes: No mediastinal mass or lymphadenopathy. Stable. Small gastric hiatal hernia. Lungs/Pleura: Chronic apical lung scarring. Centrilobular emphysema. Chronic anterior right upper lobe subpleural scarring is stable. Left lower lobe cavitary mass along the inferior aspect of the superior segment is stable and size and configuration since 09/25/2022 (series 14, image 77). However, there are extensive new peribronchial opacities in the anterior left upper lobe near the level of the mass, and scattered throughout the remaining left lower lobe and lingula. No right lung involvement. Major  airways remain patent. Trace left pleural effusion appears increased. No right pleural effusion. Upper Abdomen: Grossly negative visible liver, gallbladder, spleen, pancreas, adrenal glands and bowel. Benign appearing renal cysts (no follow-up imaging recommended). Musculoskeletal: Stable. Osteopenia. Chronic right 3rd and 4th ribs minimally displaced fracture with sclerosis might be post radiation sequelae. Chronic right lateral 5th through 7th rib fractures which are likely posttraumatic. Mild to moderate T3 compression fracture, mild T12 superior endplate compression. No acute or suspicious osseous lesion identified. Review of the MIP images confirms the above findings. IMPRESSION: 1. Negative for acute pulmonary embolus. 2. Positive for multilobar new left lung peribronchial opacity since 09/25/2022, superimposed on stable superior segment lower lobe cavitary mass. Sparing of the right lung favors Multilobar Pneumonia. Trace left pleural effusion. Underlying, Emphysema (ICD10-J43.9), right upper lobe lung scarring. 3. Aortic Atherosclerosis (ICD10-I70.0). Osteopenia with chronic rib and compression fractures. Electronically Signed   By: Odessa Fleming M.D.   On: 10/28/2022 17:00    Labs:  CBC: Recent Labs    10/28/22 1420 10/29/22 0445 10/29/22 0920 11/06/22 0857  WBC 10.5 8.8 9.8 9.8  HGB 11.9* 8.9* 9.5* 10.7*  HCT 36.4 27.4* 29.9* 33.4*  PLT 533* 462* 435* 400    COAGS: Recent Labs    10/28/22 1420 10/29/22 0445  INR 0.9 1.0    BMP: Recent Labs    10/28/22 1420 10/29/22 0445 10/29/22 1144 11/06/22 0857  NA 126* 127* 128* 129*  K 4.0 3.5 4.0 3.4*  CL 91* 93* 94* 94*  CO2 23 25 24 26   GLUCOSE 100* 86 99 90  BUN 11 <5* 6* 5*  CALCIUM 9.1 8.3* 8.4* 9.3  CREATININE 0.33* <0.30* <0.30* 0.39*  GFRNONAA >60 NOT CALCULATED NOT CALCULATED >60    LIVER FUNCTION TESTS: Recent Labs    09/25/22 1102 10/09/22 1336 10/28/22 1420 11/06/22 0857  BILITOT 0.5 0.4 0.4 0.5  AST 17 22 37  27  ALT 12 15 35 28  ALKPHOS 77 95 115 84  PROT 6.0* 6.6 7.3 6.6  ALBUMIN 3.4* 3.6 3.7 3.6    TUMOR MARKERS: No results for input(s): "AFPTM", "CEA", "CA199", "CHROMGRNA" in the last 8760 hours.  Assessment and Plan: 82 y.o. female with past medical history of hypertension and recently diagnosed squamous cell carcinoma of the left lung.  She is scheduled today for Port-A-Cath placement to assist with treatment.  She was recently treated for pneumonia.Risks and benefits of image guided port-a-catheter placement was discussed with the patient including, but not limited to bleeding, infection, pneumothorax, or fibrin sheath development and need for additional procedures.  All of the patient's questions were answered, patient is agreeable to proceed. Consent signed and in chart.    Thank you for this interesting consult.  I greatly enjoyed meeting Barbara Green and look forward to participating in their care.  A copy of this report was sent to the requesting provider on this date.  Electronically Signed: D. Jeananne Rama, PA-C 11/11/2022, 5:18 PM   I spent a total of  25 minutes   in face to face in clinical consultation, greater than 50% of which was counseling/coordinating care for Port-A-Cath placement

## 2022-11-12 ENCOUNTER — Encounter (HOSPITAL_COMMUNITY): Payer: Self-pay

## 2022-11-12 ENCOUNTER — Ambulatory Visit (HOSPITAL_COMMUNITY)
Admission: RE | Admit: 2022-11-12 | Discharge: 2022-11-12 | Disposition: A | Discharge status: Home / Self Care | Payer: Medicare HMO | Source: Ambulatory Visit | Attending: Internal Medicine | Admitting: Internal Medicine

## 2022-11-12 ENCOUNTER — Other Ambulatory Visit: Payer: Self-pay | Admitting: Internal Medicine

## 2022-11-12 ENCOUNTER — Other Ambulatory Visit: Payer: Self-pay

## 2022-11-12 DIAGNOSIS — I878 Other specified disorders of veins: Secondary | ICD-10-CM | POA: Diagnosis present

## 2022-11-12 DIAGNOSIS — Z452 Encounter for adjustment and management of vascular access device: Secondary | ICD-10-CM | POA: Diagnosis not present

## 2022-11-12 DIAGNOSIS — C3492 Malignant neoplasm of unspecified part of left bronchus or lung: Secondary | ICD-10-CM | POA: Insufficient documentation

## 2022-11-12 HISTORY — PX: IR IMAGING GUIDED PORT INSERTION: IMG5740

## 2022-11-12 MED ORDER — HEPARIN SOD (PORK) LOCK FLUSH 100 UNIT/ML IV SOLN
500.0000 [IU] | Freq: Once | INTRAVENOUS | Status: AC
  Administered 2022-11-12: 500 [IU] via INTRAVENOUS

## 2022-11-12 MED ORDER — SODIUM CHLORIDE 0.9 % IV SOLN: INTRAVENOUS | Status: DC

## 2022-11-12 MED ORDER — FENTANYL CITRATE (PF) 100 MCG/2ML IJ SOLN
INTRAMUSCULAR | Status: AC
  Filled 2022-11-12: qty 2

## 2022-11-12 MED ORDER — MIDAZOLAM HCL 2 MG/2ML IJ SOLN
INTRAMUSCULAR | Status: AC | PRN
Start: 2022-11-12 — End: 2022-11-12
  Administered 2022-11-12: 1 mg via INTRAVENOUS
  Administered 2022-11-12: .5 mg via INTRAVENOUS

## 2022-11-12 MED ORDER — FENTANYL CITRATE (PF) 100 MCG/2ML IJ SOLN
INTRAMUSCULAR | Status: AC | PRN
Start: 2022-11-12 — End: 2022-11-12
  Administered 2022-11-12 (×2): 50 ug via INTRAVENOUS

## 2022-11-12 MED ORDER — HEPARIN SOD (PORK) LOCK FLUSH 100 UNIT/ML IV SOLN
INTRAVENOUS | Status: AC
  Filled 2022-11-12: qty 5

## 2022-11-12 MED ORDER — LIDOCAINE-EPINEPHRINE 1 %-1:100000 IJ SOLN
20.0000 mL | Freq: Once | INTRAMUSCULAR | Status: AC
  Administered 2022-11-12: 18 mL via INTRADERMAL

## 2022-11-12 MED ORDER — LIDOCAINE-EPINEPHRINE 1 %-1:100000 IJ SOLN
INTRAMUSCULAR | Status: AC
  Filled 2022-11-12: qty 1

## 2022-11-12 MED ORDER — MIDAZOLAM HCL 2 MG/2ML IJ SOLN
INTRAMUSCULAR | Status: AC
  Filled 2022-11-12: qty 2

## 2022-11-12 NOTE — Discharge Instructions (Signed)
Please call Interventional Radiology clinic [phone] with any questions or concerns.  You may remove your dressing and shower tomorrow.  DO NOT use EMLA cream on your port site for 2 weeks as this cream will remove surgical glue on your incision.  Implanted Port Insertion, Care After This sheet gives you information about how to care for yourself after your procedure. Your health care provider may also give you more specific instructions. If you have problems or questions, contact your health care provider. What can I expect after the procedure? After the procedure, it is common to have: Discomfort at the port insertion site. Bruising on the skin over the port. This should improve over 3-4 days. Follow these instructions at home: [facility] care After your port is placed, you will get a manufacturer's information card. The card has information about your port. Keep this card with you at all times. Take care of the port as told by your health care provider. Ask your health care provider if you or a family member can get training for taking care of the port at home. A home health care nurse may also take care of the port. Make sure to remember what type of port you have. Incision care Follow instructions from your health care provider about how to take care of your port insertion site. Make sure you: Wash your hands with soap and water before and after you change your bandage (dressing). If soap and water are not available, use hand sanitizer. Change your dressing as told by your health care provider. Leave stitches (sutures), skin glue, or adhesive strips in place. These skin closures may need to stay in place for 2 weeks or longer. If adhesive strip edges start to loosen and curl up, you may trim the loose edges. Do not remove adhesive strips completely unless your health care provider tells you to do that. Check your port insertion site every day for signs of infection. Check for: Redness,  swelling, or pain. Fluid or blood. Warmth. Pus or a bad smell.        Activity Return to your normal activities as told by your health care provider. Ask your health care provider what activities are safe for you. Do not lift anything that is heavier than 10 lb (4.5 kg), or the limit that you are told, until your health care provider says that it is safe. General instructions Take over-the-counter and prescription medicines only as told by your health care provider. Do not take baths, swim, or use a hot tub until your health care provider approves. Ask your health care provider if you may take showers. You may only be allowed to take sponge baths. Do not drive for 24 hours if you were given a sedative during your procedure. Wear a medical alert bracelet in case of an emergency. This will tell any health care providers that you have a port. Keep all follow-up visits as told by your health care provider. This is important. Contact a health care provider if: You cannot flush your port with saline as directed, or you cannot draw blood from the port. You have a fever or chills. You have redness, swelling, or pain around your port insertion site. You have fluid or blood coming from your port insertion site. Your port insertion site feels warm to the touch. You have pus or a bad smell coming from the port insertion site. Get help right away if: You have chest pain or shortness of breath. You have bleeding from  your port that you cannot control. Summary Take care of the port as told by your health care provider. Keep the manufacturer's information card with you at all times. Change your dressing as told by your health care provider. Contact a health care provider if you have a fever or chills or if you have redness, swelling, or pain around your port insertion site. Keep all follow-up visits as told by your health care provider. This information is not intended to replace advice given to you  by your health care provider. Make sure you discuss any questions you have with your health care provider. Document Revised: 08/11/2017 Document Reviewed: 08/11/2017 Elsevier Patient Education  2021 Elsevier Inc.   Moderate Conscious Sedation, Adult, Care After This sheet gives you information about how to care for yourself after your procedure. Your health care provider may also give you more specific instructions. If you have problems or questions, contact your health care provider. What can I expect after the procedure? After the procedure, it is common to have: Sleepiness for several hours. Impaired judgment for several hours. Difficulty with balance. Vomiting if you eat too soon. Follow these instructions at home: For the time period you were told by your health care provider: Rest. Do not participate in activities where you could fall or become injured. Do not drive or use machinery. Do not drink alcohol. Do not take sleeping pills or medicines that cause drowsiness. Do not make important decisions or sign legal documents. Do not take care of children on your own.        Eating and drinking Follow the diet recommended by your health care provider. Drink enough fluid to keep your urine pale yellow. If you vomit: Drink water, juice, or soup when you can drink without vomiting. Make sure you have little or no nausea before eating solid foods.    General instructions Take over-the-counter and prescription medicines only as told by your health care provider. Have a responsible adult stay with you for the time you are told. It is important to have someone help care for you until you are awake and alert. Do not smoke. Keep all follow-up visits as told by your health care provider. This is important. Contact a health care provider if: You are still sleepy or having trouble with balance after 24 hours. You feel light-headed. You keep feeling nauseous or you keep vomiting. You  develop a rash. You have a fever. You have redness or swelling around the IV site. Get help right away if: You have trouble breathing. You have new-onset confusion at home. Summary After the procedure, it is common to feel sleepy, have impaired judgment, or feel nauseous if you eat too soon. Rest after you get home. Know the things you should not do after the procedure. Follow the diet recommended by your health care provider and drink enough fluid to keep your urine pale yellow. Get help right away if you have trouble breathing or new-onset confusion at home. This information is not intended to replace advice given to you by your health care provider. Make sure you discuss any questions you have with your health care provider. Document Revised: 05/13/2019 Document Reviewed: 12/09/2018 Elsevier Patient Education  2021 [employer].

## 2022-11-12 NOTE — Procedures (Signed)
Interventional Radiology Procedure Note  Procedure: Port placement.  Indication: Lung Ca  Findings: Please refer to procedural dictation for full description.  Complications: None  EBL: < 10 mL  Acquanetta Belling, MD [phone]

## 2022-11-17 ENCOUNTER — Other Ambulatory Visit: Payer: Self-pay | Admitting: Physician Assistant

## 2022-11-17 ENCOUNTER — Telehealth: Payer: Self-pay

## 2022-11-17 DIAGNOSIS — F40240 Claustrophobia: Secondary | ICD-10-CM

## 2022-11-17 MED ORDER — LORAZEPAM 0.5 MG PO TABS
ORAL_TABLET | ORAL | 0 refills | Status: DC
Start: 2022-11-17 — End: 2022-12-04

## 2022-11-17 NOTE — Telephone Encounter (Signed)
T/C from pt's daughter, Darl Pikes, requesting her Mother's MRI be scheduled ASAP due to her having 3 episodes of "blacking out" and having vomiting and diarrhea from Thursday to Saturday. She asked her brother to take her mother to the ED but pt and son declined  PA for MRI approved and number given to Darl Pikes to call and schedule.  *per Darl Pikes, mother's V/D has resolved and no more episodes of blacking out that she knows of.  She said her mother does not always tell her what is going on.

## 2022-11-25 ENCOUNTER — Encounter: Payer: Self-pay | Admitting: Internal Medicine

## 2022-11-28 ENCOUNTER — Other Ambulatory Visit: Payer: Self-pay

## 2022-11-28 ENCOUNTER — Encounter: Payer: Self-pay | Admitting: *Deleted

## 2022-11-28 ENCOUNTER — Ambulatory Visit
Admission: EM | Admit: 2022-11-28 | Discharge: 2022-11-28 | Disposition: A | Discharge status: Home / Self Care | Payer: Medicare HMO | Attending: Internal Medicine | Admitting: Internal Medicine

## 2022-11-28 DIAGNOSIS — N898 Other specified noninflammatory disorders of vagina: Secondary | ICD-10-CM

## 2022-11-28 DIAGNOSIS — R300 Dysuria: Secondary | ICD-10-CM | POA: Diagnosis present

## 2022-11-28 LAB — POCT URINALYSIS DIP (MANUAL ENTRY)
Bilirubin, UA: NEGATIVE
Blood, UA: NEGATIVE
Glucose, UA: NEGATIVE mg/dL
Ketones, POC UA: NEGATIVE mg/dL
Nitrite, UA: NEGATIVE
Protein Ur, POC: NEGATIVE mg/dL
Spec Grav, UA: 1.025 (ref 1.010–1.025)
Urobilinogen, UA: 0.2 U/dL
pH, UA: 6.5 (ref 5.0–8.0)

## 2022-11-28 MED ORDER — FLUCONAZOLE 150 MG PO TABS
150.0000 mg | ORAL_TABLET | Freq: Every day | ORAL | 0 refills | Status: DC
Start: 2022-11-28 — End: 2022-12-04

## 2022-11-28 NOTE — Discharge Instructions (Signed)
I am suspicious that you may have a vaginal yeast infection so I have sent a pill to help treat this.  Vaginal swab and urine culture pending as we discussed.  Follow-up with gynecologist at provided contact if symptoms persist or worsen.

## 2022-11-28 NOTE — ED Triage Notes (Signed)
Pt c/o vaginal itching. Using OTC cream with some relief

## 2022-11-28 NOTE — ED Provider Notes (Signed)
[name]    CSN: [id number] Arrival date & time: 11/28/22  0932      History   Chief Complaint Chief Complaint  Patient presents with   Vaginal Itching    HPI ATLANTIS DELONG is a 82 y.o. female.   Patient presents with approximately 1 week history of vaginal itching.  She reports mild dysuria as well but denies urinary frequency, hematuria, abdominal pain, back pain, fever.  Reports that she has been using over-the-counter " white cream" topically that she is not sure the name of with some improvement in symptoms.  Denies any associated vaginal discharge.  Patient denies that she is sexually active.  Patient denies any new soaps or lotions in her vaginal area.  Denies any noticeable rashes in that area.    Vaginal Itching    Past Medical History:  Diagnosis Date   History of radiation therapy 06/29/19-07/29/19   Right breast IMRT- Dr. Roselind Messier   History of radiation therapy    Left Lung- 07/24/22-09/08/22- Dr. Antony Blackbird   Hypertension     Patient Active Problem List   Diagnosis Date Noted   Encounter for antineoplastic immunotherapy 11/06/2022   Sepsis due to pneumonia (HCC) 10/28/2022   Multifocal pneumonia 10/28/2022   GERD without esophagitis 10/28/2022   Stage III squamous cell carcinoma of left lung (HCC) 07/07/2022   Goals of care, counseling/discussion 07/07/2022   Hyponatremia 05/29/2022   Mass of lower lobe of left lung 05/29/2022   Lung mass 05/29/2022   Malignant neoplasm of upper-inner quadrant of right breast in female, estrogen receptor positive (HCC) 04/20/2019   Tobacco dependence 02/08/2019   Unspecified lump in the right breast, upper inner quadrant  02/08/2019   Essential hypertension 02/08/2019   Tachycardia 02/08/2019    Past Surgical History:  Procedure Laterality Date   BREAST LUMPECTOMY WITH SENTINEL LYMPH NODE BIOPSY Right 05/17/2019   Procedure: RIGHT BREAST LUMPECTOMY WITH SENTINEL LYMPH NODE BIOPSY;  Surgeon: Abigail Miyamoto, MD;  Location: [facility];  Service: General;  Laterality: Right;   BRONCHIAL BIOPSY  06/12/2022   Procedure: BRONCHIAL BIOPSIES;  Surgeon: Omar Person, MD;  Location: [facility] ENDOSCOPY;  Service: Pulmonary;;   BRONCHIAL NEEDLE ASPIRATION BIOPSY  06/12/2022   Procedure: BRONCHIAL NEEDLE ASPIRATION BIOPSIES;  Surgeon: Omar Person, MD;  Location: [facility] ENDOSCOPY;  Service: Pulmonary;;   FIDUCIAL MARKER PLACEMENT  06/12/2022   Procedure: FIDUCIAL MARKER PLACEMENT;  Surgeon: Omar Person, MD;  Location: [facility] ENDOSCOPY;  Service: Pulmonary;;   IR IMAGING GUIDED PORT INSERTION  11/12/2022   NO PAST SURGERIES     VIDEO BRONCHOSCOPY WITH ENDOBRONCHIAL ULTRASOUND N/A 06/12/2022   Procedure: VIDEO BRONCHOSCOPY WITH ENDOBRONCHIAL ULTRASOUND;  Surgeon: Omar Person, MD;  Location: [facility] ENDOSCOPY;  Service: Pulmonary;  Laterality: N/A;    OB History   No obstetric history on file.      Home Medications    Prior to Admission medications   Medication Sig Start Date End Date Taking? Authorizing Provider  fluconazole (DIFLUCAN) 150 MG tablet Take 1 tablet (150 mg total) by mouth daily. 11/28/22  Yes An Lannan, Acie Fredrickson, FNP  acetaminophen (TYLENOL) 325 MG tablet Take 2 tablets (650 mg total) by mouth every 6 (six) hours as needed for mild pain (or Fever >/= 101). 10/29/22   Sheikh, Omair Latif, DO  BONINE 25 MG CHEW Chew 25-50 mg by mouth daily as needed (for vertigo).    [provider]  demeclocycline (DECLOMYCIN) 150 MG tablet Take  1 tablet (150 mg total) by mouth 2 (two) times daily. 11/06/22   Heilingoetter, Cassandra L, PA-C  lidocaine-prilocaine (EMLA) cream Apply 1 Application topically as needed. 11/06/22   Heilingoetter, Cassandra L, PA-C  lisinopril (ZESTRIL) 5 MG tablet Take 1 tablet (5 mg total) by mouth daily. 06/17/22   [name], MD  LORazepam (ATIVAN) 0.5 MG tablet Take 1 tablet about 60-30 minutes before your MRI 11/17/22   Heilingoetter,  Cassandra L, PA-C  nicotine (NICODERM CQ - DOSED IN MG/24 HOURS) 14 mg/24hr patch Place 1 patch (14 mg total) onto the skin daily. 08/13/22   Si Gaul, MD  ondansetron (ZOFRAN) 4 MG tablet Take 1 tablet (4 mg total) by mouth every 6 (six) hours as needed for nausea. 10/29/22   Sheikh, Omair Latif, DO  ondansetron (ZOFRAN) 8 MG tablet Take 1 tablet (8 mg total) by mouth every 8 (eight) hours as needed for nausea or vomiting. 11/07/22   Heilingoetter, Cassandra L, PA-C  sodium chloride 1 g tablet Take 1 tablet (1 g total) by mouth daily. 11/06/22   Heilingoetter, Cassandra L, PA-C    Family History Family History  Problem Relation Age of Onset   Alzheimer's disease Mother    Heart disease Father    Alcohol abuse Father     Social History Social History   Tobacco Use   Smoking status: Former    Current packs/day: 0.00    Average packs/day: 0.3 packs/day for 30.0 years (9.9 ttl pk-yrs)    Types: Cigarettes    Start date: 05/1992    Quit date: 05/2022    Years since quitting: 0.5    Passive exposure: Current   Smokeless tobacco: Never  Vaping Use   Vaping status: Never Used  Substance Use Topics   Alcohol use: Not Currently    Comment: occasionally a glass wine   Drug use: Never     Allergies   Patient has no known allergies.   Review of Systems Review of Systems Per HPI  Physical Exam Triage Vital Signs ED Triage Vitals  Encounter Vitals Group     BP 11/28/22 1012 115/74     Systolic BP Percentile --      Diastolic BP Percentile --      Pulse Rate 11/28/22 1012 (!) 106     Resp 11/28/22 1012 16     Temp 11/28/22 1012 97.8 F (36.6 C)     Temp Source 11/28/22 1012 Oral     SpO2 11/28/22 1012 98 %     Weight --      Height --      Head Circumference --      Peak Flow --      Pain Score 11/28/22 1033 0     Pain Loc --      Pain Education --      Exclude from Growth Chart --    No data found.  Updated Vital Signs BP 115/74 (BP Location: Left Arm)    Pulse (!) 106   Temp 97.8 F (36.6 C) (Oral)   Resp 16   SpO2 98%   Visual Acuity Right Eye Distance:   Left Eye Distance:   Bilateral Distance:    Right Eye Near:   Left Eye Near:    Bilateral Near:     Physical Exam Constitutional:      General: She is not in acute distress.    Appearance: Normal appearance. She is not toxic-appearing or diaphoretic.  HENT:  Head: Normocephalic and atraumatic.  Eyes:     Extraocular Movements: Extraocular movements intact.     Conjunctiva/sclera: Conjunctivae normal.  Pulmonary:     Effort: Pulmonary effort is normal.  Genitourinary:    Comments: Deferred with shared decision making.  Self swab performed. Neurological:     General: No focal deficit present.     Mental Status: She is alert and oriented to person, place, and time. Mental status is at baseline.  Psychiatric:        Mood and Affect: Mood normal.        Behavior: Behavior normal.        Thought Content: Thought content normal.        Judgment: Judgment normal.      UC Treatments / Results  Labs (all labs ordered are listed, but only abnormal results are displayed) Labs Reviewed  POCT URINALYSIS DIP (MANUAL ENTRY) - Abnormal; Notable for the following components:      Result Value   Leukocytes, UA Trace (*)    All other components within normal limits  URINE CULTURE  CERVICOVAGINAL ANCILLARY ONLY    EKG   Radiology No results found.  Procedures Procedures (including critical care time)  Medications Ordered in UC Medications - No data to display  Initial Impression / Assessment and Plan / UC Course  I have reviewed the triage vital signs and the nursing notes.  Pertinent labs & imaging results that were available during my care of the patient were reviewed by me and considered in my medical decision making (see chart for details).     Patient presenting with vaginal itching, and I am very suspicious of vaginal yeast infection.  Will send  cervicovaginal swab to test for BV and yeast as patient denies that she is sexually active.  Urinalysis unremarkable but does show trace leukocytes and with associated mild dysuria, will send urine culture to confirm.  Awaiting results.  Given suspicion of vaginal yeast infection with minimal improvement with topical medication, will treat with Diflucan.  No obvious contraindications to Diflucan noted in patient's history and creatinine clearance is 77 so no dosage adjustment necessary.  Advised strict follow-up with PCP, urgent care, or provided contact information for gynecology if symptoms persist or worsen.  Patient verbalized understanding and was agreeable with plan.  Final Clinical Impressions(s) / UC Diagnoses   Final diagnoses:  Vaginal itching  Dysuria     Discharge Instructions      I am suspicious that you may have a vaginal yeast infection so I have sent a pill to help treat this.  Vaginal swab and urine culture pending as we discussed.  Follow-up with gynecologist at provided contact if symptoms persist or worsen.    ED Prescriptions     Medication Sig Dispense Auth. Provider   fluconazole (DIFLUCAN) 150 MG tablet Take 1 tablet (150 mg total) by mouth daily. 1 tablet [place], Acie Fredrickson, [place]      PDMP not reviewed this encounter.   Gustavus Bryant, [place] 11/28/22 1125

## 2022-11-29 LAB — URINE CULTURE: Culture: NO GROWTH

## 2022-12-01 LAB — CERVICOVAGINAL ANCILLARY ONLY
Bacterial Vaginitis (gardnerella): NEGATIVE
Candida Glabrata: NEGATIVE
Candida Vaginitis: POSITIVE — AB
Comment: NEGATIVE
Comment: NEGATIVE
Comment: NEGATIVE

## 2022-12-02 ENCOUNTER — Telehealth: Payer: Self-pay

## 2022-12-02 NOTE — Progress Notes (Unsigned)
[facility] OFFICE PROGRESS NOTE  [name], MD [address] [place] [address]  DIAGNOSIS: Stage IIIA (T2b, N2, M0) non-small cell lung cancer, squamous cell carcinoma presented with large left lower lobe lung mass in addition to left hilar and suspicious mediastinal lymphadenopathy diagnosed in May 2024.   Molecular studies by [id number] showed no actionable mutations and PD-L1 expression of 2%.   PRIOR THERAPY: A course of concurrent chemoradiation with weekly carboplatin for AUC of 2 and paclitaxel 45 Mg/M2. First dose July 21, 2022. Status post 7 cycles. Last dose was given on 09/01/2022.   CURRENT THERAPY: Consolidation treatment with immunotherapy with Imfinzi 1500 Mg IV every 4 weeks. First dose 10/09/2022. Status post 2 cycles.   INTERVAL HISTORY: Barbara Green 82 y.o. female returns to the clinic today for a follow-up visit.  The patient was last seen by myself 1 month ago.  In the interval since last being seen there have been several MyChart messages from the patient's family member.  When called to offer an appointment sooner for the concerns, the patient declined and stated that she was feeling fair.     One of the calls in the interval was related to dizziness.  Therefore a brain MRI was ordered but the patient's daughter was not able to take her to her appointment until November. She this done yesterday which is negative for metastatic disease to the brain. Her persistent dizziness, which she describes as the most distressing symptom. The dizziness is not associated with a spinning sensation or a feeling of impending syncope, but it significantly impacts her quality of life and mobility. The patient reports that the dizziness is most pronounced upon standing and moving.   The patient has been struggling with hyponatremia of unclear etiology.  Been on sodium chloride packets but they had for taste and she discontinued this.  Therefore her  daughter brought her other sodium packets.  She also only drinks Gatorade to try to limit her free water use.  She still taking demeclocycline..   the patient reports a persistent dry cough, which comes in spells and can last for several minutes. The cough is particularly bothersome at night, occasionally disrupting her sleep. The patient has been managing the cough with over-the-counter cough drops and has not noticed any association with eating or lying down. There is no history of heart disease, and the patient denies any chest discomfort, coughing up blood, or other symptoms of respiratory distress, infection, or sick contacts. Her cough is stable to a little improved compared to her last appointment.   The patient also reports a recent episode of vaginal itching, which was managed at an urgent care center and has since resolved. There have been no changes in appetite, and the patient's weight has remained stable. The patient denies any recent fevers, chills, night sweats, rashes, nausea, vomiting, diarrhea, constipation, or abdominal pain.  The patient's cancer treatment appears to be well-tolerated, with no new or worsening symptoms reported. The patient recently had a port placed for her treatment, which she reports as a positive experience.    She is here today for evaluation repeat blood work before undergoing cycle #3 and to review her brain MRI. MEDICAL HISTORY: Past Medical History:  Diagnosis Date   History of radiation therapy 06/29/19-07/29/19   Right breast IMRT- Dr. Roselind Messier   History of radiation therapy    Left Lung- 07/24/22-09/08/22- Dr. Antony Blackbird   Hypertension     ALLERGIES:  has No Known Allergies.  MEDICATIONS:  Current Outpatient Medications  Medication Sig Dispense Refill   BONINE 25 MG CHEW Chew 25-50 mg by mouth daily as needed (for vertigo).     demeclocycline (DECLOMYCIN) 150 MG tablet Take 1 tablet (150 mg total) by mouth 2 (two) times daily. 60 tablet 2    lidocaine-prilocaine (EMLA) cream Apply 1 Application topically as needed. 30 g 2   lisinopril (ZESTRIL) 5 MG tablet Take 1 tablet (5 mg total) by mouth daily. 90 tablet 1   nicotine (NICODERM CQ - DOSED IN MG/24 HOURS) 14 mg/24hr patch Place 1 patch (14 mg total) onto the skin daily. 30 patch 0   ondansetron (ZOFRAN) 4 MG tablet Take 1 tablet (4 mg total) by mouth every 6 (six) hours as needed for nausea. 20 tablet 0   ondansetron (ZOFRAN) 8 MG tablet Take 1 tablet (8 mg total) by mouth every 8 (eight) hours as needed for nausea or vomiting. 30 tablet 2   OVER THE COUNTER MEDICATION 2 (two) times daily. LMNT electrolyte drink mix- 1,000 mg sodium, 200 mg potasium, 60 mg magnesium     acetaminophen (TYLENOL) 325 MG tablet Take 2 tablets (650 mg total) by mouth every 6 (six) hours as needed for mild pain (or Fever >/= 101). (Patient not taking: Reported on 12/04/2022) 20 tablet 0   No current facility-administered medications for this visit.    SURGICAL HISTORY:  Past Surgical History:  Procedure Laterality Date   BREAST LUMPECTOMY WITH SENTINEL LYMPH NODE BIOPSY Right 05/17/2019   Procedure: RIGHT BREAST LUMPECTOMY WITH SENTINEL LYMPH NODE BIOPSY;  Surgeon: Abigail Miyamoto, MD;  Location: [facility];  Service: General;  Laterality: Right;   BRONCHIAL BIOPSY  06/12/2022   Procedure: BRONCHIAL BIOPSIES;  Surgeon: Omar Person, MD;  Location: [facility] ENDOSCOPY;  Service: Pulmonary;;   BRONCHIAL NEEDLE ASPIRATION BIOPSY  06/12/2022   Procedure: BRONCHIAL NEEDLE ASPIRATION BIOPSIES;  Surgeon: Omar Person, MD;  Location: [facility] ENDOSCOPY;  Service: Pulmonary;;   FIDUCIAL MARKER PLACEMENT  06/12/2022   Procedure: FIDUCIAL MARKER PLACEMENT;  Surgeon: Omar Person, MD;  Location: [facility] ENDOSCOPY;  Service: Pulmonary;;   IR IMAGING GUIDED PORT INSERTION  11/12/2022   NO PAST SURGERIES     VIDEO BRONCHOSCOPY WITH ENDOBRONCHIAL ULTRASOUND N/A 06/12/2022   Procedure: VIDEO BRONCHOSCOPY  WITH ENDOBRONCHIAL ULTRASOUND;  Surgeon: Omar Person, MD;  Location: [facility] ENDOSCOPY;  Service: Pulmonary;  Laterality: N/A;    REVIEW OF SYSTEMS:   Constitutional: Positive for generalized weakness/staggering. Negative for appetite change, chills, fever and unexpected weight change.  HENT: Negative for mouth sores, nosebleeds, sore throat and trouble swallowing.   Eyes: Negative for eye problems and icterus.  Respiratory: Mild intermittent cough.  Negative for hemoptysis, shortness of breath and wheezing.   Cardiovascular: Negative for chest pain and leg swelling.  Gastrointestinal: Negative for abdominal pain, constipation, diarrhea, nausea and vomiting.  Genitourinary: Negative for bladder incontinence, difficulty urinating, dysuria, frequency and hematuria.   Musculoskeletal: Negative for back pain, gait problem, neck pain and neck stiffness.  Skin: Negative for itching and rash.  Neurological: Positive for dizziness. Negative for extremity weakness, headaches, light-headedness and seizures.  Hematological: Negative for adenopathy. Does not bruise/bleed easily.  Psychiatric/Behavioral: Negative for confusion, depression and sleep disturbance. The patient is not nervous/anxious.     PHYSICAL EXAMINATION:  Blood pressure 135/75, pulse 84, temperature 97.6 F (36.4 C), temperature source Oral, resp. rate 17, weight 97 lb 14.4 oz (44.4 kg), SpO2 100%.  ECOG PERFORMANCE STATUS: 1  Physical Exam  Constitutional: Oriented to person, place, and time and thin appearing female and in no distress.  HENT:  Head: Normocephalic and atraumatic.  Mouth/Throat: Oropharynx is clear and moist. No oropharyngeal exudate.  Eyes: Conjunctivae are normal. Right eye exhibits no discharge. Left eye exhibits no discharge. No scleral icterus.  Neck: Normal range of motion. Neck supple.  Cardiovascular: Normal rate, regular rhythm, normal heart sounds and intact distal pulses.   Pulmonary/Chest: Effort  normal and breath sounds normal. No respiratory distress. No wheezes. No rales.  Abdominal: Soft. Bowel sounds are normal. Exhibits no distension and no mass. There is no tenderness.  Musculoskeletal: Normal range of motion. Exhibits no edema.  Lymphadenopathy:    No cervical adenopathy.  Neurological: Alert and oriented to person, place, and time. Exhibits muscle wasting.  Gait normal. Coordination normal. She has a cane for ambulation but she does not use it.  Skin: Skin is warm and dry. No rash noted. Not diaphoretic. No erythema. No pallor.  Psychiatric: Mood, memory and judgment normal.  Vitals reviewed.  LABORATORY DATA: Lab Results  Component Value Date   WBC 9.2 12/04/2022   HGB 10.6 (L) 12/04/2022   HCT 32.0 (L) 12/04/2022   MCV 86.0 12/04/2022   PLT 412 (H) 12/04/2022      Chemistry      Component Value Date/Time   NA 130 (L) 12/04/2022 1002   NA 124 (L) 05/26/2022 1323   K 3.9 12/04/2022 1002   CL 96 (L) 12/04/2022 1002   CO2 27 12/04/2022 1002   BUN 8 12/04/2022 1002   BUN 7 (L) 05/26/2022 1323   CREATININE <0.30 (L) 12/04/2022 1002      Component Value Date/Time   CALCIUM 8.9 12/04/2022 1002   ALKPHOS 81 12/04/2022 1002   AST 17 12/04/2022 1002   ALT 13 12/04/2022 1002   BILITOT 0.4 12/04/2022 1002       RADIOGRAPHIC STUDIES:  MR Brain W Wo Contrast  Result Date: 12/04/2022 CLINICAL DATA:  Metastatic disease evaluation. EXAM: MRI HEAD WITHOUT AND WITH CONTRAST TECHNIQUE: Multiplanar, multiecho pulse sequences of the brain and surrounding structures were obtained without and with intravenous contrast. CONTRAST:  4mL GADAVIST GADOBUTROL 1 MMOL/ML IV SOLN COMPARISON:  Brain MRI 07/07/2022 FINDINGS: Brain: There is no acute intracranial hemorrhage, extra-axial fluid collection, or acute infarct. There is mild parenchymal volume loss with prominence of the ventricular system and extra-axial CSF spaces. Patchy and confluent FLAIR signal abnormality in the  supratentorial white matter likely reflects sequela of moderate underlying chronic small-vessel ischemic change. There is no cortical encephalomalacia. There are no chronic blood products. The pituitary and suprasellar region are normal there is no mass lesion or abnormal enhancement. There is no mass effect or midline shift. Vascular: Choose Skull and upper cervical spine: Heterogeneous marrow signal is again seen throughout the calvarium and imaged upper cervical spine. Sinuses/Orbits: The imaged paranasal sinuses are clear. The globes and orbits are unremarkable. Other: The mastoid air cells and middle ear cavities are clear. IMPRESSION: 1. No evidence of intracranial metastatic disease. 2. Unchanged nonspecific marrow signal throughout the calvarium and imaged upper cervical spine. Electronically Signed   By: Lesia Hausen M.D.   On: 12/04/2022 08:42   IR IMAGING GUIDED PORT INSERTION  Result Date: 11/12/2022 INDICATION: Left lung malignancy EXAM: IMPLANTED PORT A CATH PLACEMENT WITH ULTRASOUND AND FLUOROSCOPIC GUIDANCE MEDICATIONS: None ANESTHESIA/SEDATION: Moderate (conscious) sedation was employed during this procedure. A total of Versed 1.5 mg  and Fentanyl 100 mcg was administered intravenously by the radiology nurse. Total intra-service moderate Sedation Time: 20 minutes. The patient's level of consciousness and vital signs were monitored continuously by radiology nursing throughout the procedure under my direct supervision. FLUOROSCOPY: Radiation Exposure Index (as provided by the fluoroscopic device): 1 mGy Kerma COMPLICATIONS: None immediate. PROCEDURE: The procedure, risks, benefits, and alternatives were explained to the patient. Questions regarding the procedure were encouraged and answered. The patient understands and consents to the procedure. A timeout was performed prior to the initiation of the procedure. Patient positioned supine on the angiography table. Right neck and anterior upper chest  prepped and draped in the usual sterile fashion. All elements of maximal sterile barrier were utilized including, cap, mask, sterile gown, sterile gloves, large sterile drape, hand scrubbing and 2% Chlorhexidine for skin cleaning. The right internal jugular vein was evaluated with ultrasound and shown to be patent. A permanent ultrasound image was obtained and placed in the patient's medical record. Local anesthesia was provided with 1% lidocaine with epinephrine. Using sterile gel and a sterile probe cover, the right internal jugular vein was entered with a 21 ga needle during real time ultrasound guidance. 0.018 inch guidewire placed and 21 ga needle exchanged for transitional dilator set. Utilizing fluoroscopy, 0.035 inch guidewire advanced centrally without difficulty. Attention then turned to the right anterior upper chest. Following local lidocaine administration, a port pocket was created. The catheter was connected to the port and brought from the pocket to the venotomy site through a subcutaneous tunnel. The catheter was cut to size and inserted through the peel-away sheath. The catheter tip was positioned at the cavoatrial junction using fluoroscopic guidance. The port aspirated and flushed well. The port pocket was closed with deep and superficial absorbable suture. The port pocket incision and venotomy sites were also sealed with Dermabond. IMPRESSION: 1. Right internal jugular approach power injectable Port-A-Cath is ready for use. 2. Catheter tip is at the cavoatrial junction. Electronically Signed   By: Acquanetta Belling M.D.   On: 11/12/2022 15:29     ASSESSMENT/PLAN:  This is a very pleasant 82 year old Caucasian female diagnosed with stage IIIa (T2 would be, N2, M0) non-small cell lung cancer, squamous cell carcinoma.  She presented with a left lower lobe lung mass in addition to ipsilateral infrahilar nodal metastasis.  She was diagnosed in June 2024.   She is not a good candidate for surgical  resection.   She underwent a course of concurrent chemoradiation with weekly carboplatin for AUC of 2 and paclitaxel 45 mg/m.  She is status post 7 cycles.  The last dose was given on 09/01/2022.   She is currently undergoing consolidation immunotherapy with Imfinzi 1500 mg IV every 4 weeks.  She is status post 2 cycles.  She had called the clinic not feeling well in the interval and was found to have multilobar pneumonia.  She was treated with Ceftin and azithromycin.  We will be arranging for surveillance imaging and 1-2 more cycles of treatment.    She denies any persistent cough at this time.  She may have an intermittent dry cough throughout the day which is controlled with a cough drop.  He denies any significant shortness of breath.  Needed to take a cough suppressant at night would recommend Delsym or Robitussin.  The patient's daughter was asking about promethazine but I would not recommend that in the patient's age.  Her sodium is stable at 130.  Do not see a clear etiology of  her hyponatremia with her medications.  If she needed to see a nephrologist, that may be next step if she is interested.  They are interested in a referral to a new PCP we will place referral to [facility].  However I did strongly encourage her to follow-up with her current PCP regarding her dizziness which could have multiple etiologies.  Her brain MRI was negative for metastatic disease.  Discussed the importance of changing positions slowly in the event this is related to orthostatic hypotension.  I encouraged her to ask her PCP about possible vestibular rehab or if she needs to see neurology or ENT based on their evaluation of her dizziness.  I offered referral to neurology but they declined until she can see her PCP.   Arrange for restaging CT scan of her chest to restage her disease before her next visit.  If she feels like she has any worsening breathing changes or cough then she may have her scan performed  sooner.  Encouraged her to continue restricting her free water and continuing her demeclocycline.  Recommend that she proceed with cycle #3 today as scheduled.   We will see her back for follow-up visit in 4 weeks for evaluation and repeat blood work before undergoing cycle #3.   The patient was advised to call immediately if she has any concerning symptoms in the interval. The patient voices understanding of current disease status and treatment options and is in agreement with the current care plan. All questions were answered. The patient knows to call the clinic with any problems, questions or concerns. We can certainly see the patient much sooner if necessary   Orders Placed This Encounter  Procedures   CT Chest W Contrast    Standing Status:   Future    Standing Expiration Date:   12/04/2023    Order Specific Question:   If indicated for the ordered procedure, I authorize the administration of contrast media per Radiology protocol    Answer:   Yes    Order Specific Question:   Does the patient have a contrast media/X-ray dye allergy?    Answer:   No    Order Specific Question:   Preferred imaging location?    Answer:   [facility]     The total time spent in the appointment was 30-39 minutes  Britnie Colville L Kalilah Barua, PA-C 12/04/22

## 2022-12-02 NOTE — Telephone Encounter (Signed)
Pt's daughter left a mychart message with Dr. Georga Bora office and stated that the pt was SOB and cough.    Called and spoke with the pt about sxs.  She stated that she is dizzy and coughing. Denies any fever, mucous from cough & SOB.  Pt is taking it easy.  Offered pt to be seen before Thursday with Cassie and she states that she can wait.  Informed her to call with any worse sxs and we can get her in to be seen.  Pt verbalized understanding.

## 2022-12-03 ENCOUNTER — Ambulatory Visit (HOSPITAL_COMMUNITY)
Admission: RE | Admit: 2022-12-03 | Discharge: 2022-12-03 | Disposition: A | Discharge status: Home / Self Care | Payer: Medicare HMO | Source: Ambulatory Visit | Attending: Physician Assistant | Admitting: Physician Assistant

## 2022-12-03 DIAGNOSIS — C3492 Malignant neoplasm of unspecified part of left bronchus or lung: Secondary | ICD-10-CM | POA: Diagnosis present

## 2022-12-03 DIAGNOSIS — R2689 Other abnormalities of gait and mobility: Secondary | ICD-10-CM | POA: Insufficient documentation

## 2022-12-03 MED ORDER — GADOBUTROL 1 MMOL/ML IV SOLN
4.0000 mL | Freq: Once | INTRAVENOUS | Status: AC | PRN
  Administered 2022-12-03: 4 mL via INTRAVENOUS

## 2022-12-04 ENCOUNTER — Other Ambulatory Visit: Payer: Self-pay

## 2022-12-04 ENCOUNTER — Inpatient Hospital Stay: Payer: Medicare HMO | Admitting: Physician Assistant

## 2022-12-04 ENCOUNTER — Other Ambulatory Visit: Payer: Medicare HMO

## 2022-12-04 ENCOUNTER — Inpatient Hospital Stay: Payer: Medicare HMO | Attending: Physician Assistant

## 2022-12-04 ENCOUNTER — Inpatient Hospital Stay: Payer: Medicare HMO

## 2022-12-04 VITALS — BP 135/75 | HR 84 | Temp 97.6°F | Resp 17 | Wt 97.9 lb

## 2022-12-04 DIAGNOSIS — C3492 Malignant neoplasm of unspecified part of left bronchus or lung: Secondary | ICD-10-CM

## 2022-12-04 DIAGNOSIS — I10 Essential (primary) hypertension: Secondary | ICD-10-CM | POA: Insufficient documentation

## 2022-12-04 DIAGNOSIS — Z79899 Other long term (current) drug therapy: Secondary | ICD-10-CM | POA: Insufficient documentation

## 2022-12-04 DIAGNOSIS — Z5112 Encounter for antineoplastic immunotherapy: Secondary | ICD-10-CM | POA: Diagnosis not present

## 2022-12-04 DIAGNOSIS — E871 Hypo-osmolality and hyponatremia: Secondary | ICD-10-CM | POA: Insufficient documentation

## 2022-12-04 DIAGNOSIS — C3432 Malignant neoplasm of lower lobe, left bronchus or lung: Secondary | ICD-10-CM | POA: Insufficient documentation

## 2022-12-04 DIAGNOSIS — Z5111 Encounter for antineoplastic chemotherapy: Secondary | ICD-10-CM | POA: Diagnosis present

## 2022-12-04 LAB — CMP (CANCER CENTER ONLY)
ALT: 13 U/L (ref 0–44)
AST: 17 U/L (ref 15–41)
Albumin: 3.5 g/dL (ref 3.5–5.0)
Alkaline Phosphatase: 81 U/L (ref 38–126)
Anion gap: 7 (ref 5–15)
BUN: 8 mg/dL (ref 8–23)
CO2: 27 mmol/L (ref 22–32)
Calcium: 8.9 mg/dL (ref 8.9–10.3)
Chloride: 96 mmol/L — ABNORMAL LOW (ref 98–111)
Creatinine: <0.3 mg/dL — ABNORMAL LOW (ref 0.44–1.00)
Glucose, Bld: 91 mg/dL (ref 70–99)
Potassium: 3.9 mmol/L (ref 3.5–5.1)
Sodium: 130 mmol/L — ABNORMAL LOW (ref 135–145)
Total Bilirubin: 0.4 mg/dL (ref <1.2)
Total Protein: 6.4 g/dL — ABNORMAL LOW (ref 6.5–8.1)

## 2022-12-04 LAB — CBC WITH DIFFERENTIAL (CANCER CENTER ONLY)
Abs Immature Granulocytes: 0.04 10*3/uL (ref 0.00–0.07)
Basophils Absolute: 0.1 10*3/uL (ref 0.0–0.1)
Basophils Relative: 1 %
Eosinophils Absolute: 0.1 10*3/uL (ref 0.0–0.5)
Eosinophils Relative: 1 %
HCT: 32 % — ABNORMAL LOW (ref 36.0–46.0)
Hemoglobin: 10.6 g/dL — ABNORMAL LOW (ref 12.0–15.0)
Immature Granulocytes: 0 %
Lymphocytes Relative: 17 %
Lymphs Abs: 1.6 10*3/uL (ref 0.7–4.0)
MCH: 28.5 pg (ref 26.0–34.0)
MCHC: 33.1 g/dL (ref 30.0–36.0)
MCV: 86 fL (ref 80.0–100.0)
Monocytes Absolute: 1.2 10*3/uL — ABNORMAL HIGH (ref 0.1–1.0)
Monocytes Relative: 13 %
Neutro Abs: 6.1 10*3/uL (ref 1.7–7.7)
Neutrophils Relative %: 68 %
Platelet Count: 412 10*3/uL — ABNORMAL HIGH (ref 150–400)
RBC: 3.72 MIL/uL — ABNORMAL LOW (ref 3.87–5.11)
RDW: 13.5 % (ref 11.5–15.5)
WBC Count: 9.2 10*3/uL (ref 4.0–10.5)
nRBC: 0 % (ref 0.0–0.2)

## 2022-12-04 LAB — TSH: TSH: 2.15 u[IU]/mL (ref 0.350–4.500)

## 2022-12-04 MED ORDER — SODIUM CHLORIDE 0.9 % IV SOLN
1500.0000 mg | Freq: Once | INTRAVENOUS | Status: AC
  Administered 2022-12-04: 1500 mg via INTRAVENOUS
  Filled 2022-12-04: qty 30

## 2022-12-04 MED ORDER — SODIUM CHLORIDE 0.9 % IV SOLN: Freq: Once | INTRAVENOUS | Status: AC

## 2022-12-04 MED ORDER — SODIUM CHLORIDE 0.9% FLUSH
10.0000 mL | INTRAVENOUS | Status: DC | PRN
  Administered 2022-12-04: 10 mL

## 2022-12-04 MED ORDER — HEPARIN SOD (PORK) LOCK FLUSH 100 UNIT/ML IV SOLN
500.0000 [IU] | Freq: Once | INTRAVENOUS | Status: AC | PRN
  Administered 2022-12-04: 500 [IU]

## 2022-12-04 NOTE — Progress Notes (Signed)
Per Wilford Sports, [place]- referral sent to [facility] for a new PCP and [facility] as well.

## 2022-12-05 LAB — T4: T4, Total: 8 ug/dL (ref 4.5–12.0)

## 2022-12-18 ENCOUNTER — Telehealth: Payer: Self-pay

## 2022-12-18 NOTE — Telephone Encounter (Signed)
Pt called the access nurse on 11/19 to reschedule CT scan that's on 11/25.  Spoke with pt and she states that she has family coming in from 11/22-11/29.  Informed pt of centralized scheduling number at [phone] to call and reschedule on 11/29 or 12/2 because she needs her scan before her appt with Cassie, PA on 12/5. Pt verbalized understanding.

## 2022-12-22 ENCOUNTER — Ambulatory Visit (HOSPITAL_COMMUNITY): Payer: Medicare HMO

## 2022-12-26 ENCOUNTER — Ambulatory Visit (HOSPITAL_COMMUNITY)
Admission: RE | Admit: 2022-12-26 | Discharge: 2022-12-26 | Disposition: A | Discharge status: Home / Self Care | Payer: Medicare HMO | Source: Ambulatory Visit | Attending: Physician Assistant | Admitting: Physician Assistant

## 2022-12-26 DIAGNOSIS — C3492 Malignant neoplasm of unspecified part of left bronchus or lung: Secondary | ICD-10-CM | POA: Insufficient documentation

## 2022-12-26 MED ORDER — IOHEXOL 300 MG/ML  SOLN
75.0000 mL | Freq: Once | INTRAMUSCULAR | Status: AC | PRN
  Administered 2022-12-26: 75 mL via INTRAVENOUS

## 2022-12-26 MED ORDER — HEPARIN SOD (PORK) LOCK FLUSH 100 UNIT/ML IV SOLN
500.0000 [IU] | Freq: Once | INTRAVENOUS | Status: AC
  Administered 2022-12-26: 500 [IU] via INTRAVENOUS

## 2022-12-29 ENCOUNTER — Ambulatory Visit: Payer: Self-pay

## 2022-12-29 NOTE — Telephone Encounter (Signed)
Message from Lennox Pippins sent at 12/29/2022 11:00 AM EST  Summary: dizziness / no other symptoms   Patient called and states her Oncologist suggested call PCP and see if Dr Andrey Campanile can call her in medication for dizziness. She states she is very dizzy, no other symptoms. Patient states she is on medication for her sodium level and she thinks when it is down is when she is dizzy but she is on medication for it. Patient states it is un-nerving. Patient states she needs something for this dizziness.   Patient's callback # [phone] (Patient states a call back this afternoon after 1pm is best).         Chief Complaint: off balance Symptoms: dizziness Frequency: years but has recently worsened Pertinent Negatives: Patient denies fever, vomiting, chest pain, diarrhea ,bleeding Disposition: [] ED /[] Urgent Care (no appt availability in office) / [] Appointment(In office/virtual)/ []  [facility]/ [] Home Care/ [x] Refused Recommended Disposition /[] [facility]/ []  Follow-up with PCP Additional Notes: advised pt that she will need to be evaluated. Pt stated that she does not want to make an appt. Pt stated that she will if PCP feels she needs one. Just wanted prescription called in.   Reason for Disposition  [1] MODERATE dizziness (e.g., interferes with normal activities) AND [2] has been evaluated by doctor (or NP/PA) for this  Answer Assessment - Initial Assessment Questions 1. DESCRIPTION: "Describe your dizziness."     Feels off balance 2. LIGHTHEADED: "Do you feel lightheaded?" (e.g., somewhat faint, woozy, weak upon standing)     Staggers around 3. VERTIGO: "Do you feel like either you or the room is spinning or tilting?" (i.e. vertigo)     N/a 4. SEVERITY: "How bad is it?"  "Do you feel like you are going to faint?" "Can you stand and walk?"   - MILD: Feels slightly dizzy, but walking normally.   - MODERATE: Feels unsteady when walking, but not falling; interferes  with normal activities (e.g., school, work).   - SEVERE: Unable to walk without falling, or requires assistance to walk without falling; feels like passing out now.      moderate 5. ONSET:  "When did the dizziness begin?"     Years ago - Na was  6. AGGRAVATING FACTORS: "Does anything make it worse?" (e.g., standing, change in head position)     Standing and walking 7. HEART RATE: "Can you tell me your heart rate?" "How many beats in 15 seconds?"  (Note: not all patients can do this)       N/a 8. CAUSE: "What do you think is causing the dizziness?"     unsure 9. RECURRENT SYMPTOM: "Have you had dizziness before?" If Yes, ask: "When was the last time?" "What happened that time?"     Yes for years  10. OTHER SYMPTOMS: "Do you have any other symptoms?" (e.g., fever, chest pain, vomiting, diarrhea, bleeding)       no 11. PREGNANCY: "Is there any chance you are pregnant?" "When was your last menstrual period?"       N/a  Protocols used: Dizziness - Lightheadedness-A-AH

## 2022-12-29 NOTE — Telephone Encounter (Signed)
Patient called, left VM to return the call to the office to speak to the NT.     Summary: dizziness / no other symptoms   Patient called and states her Oncologist suggested call PCP and see if Dr Andrey Campanile can call her in medication for dizziness. She states she is very dizzy, no other symptoms. Patient states she is on medication for her sodium level and she thinks when it is down is when she is dizzy but she is on medication for it. Patient states it is un-nerving. Patient states she needs something for this dizziness.   Patient's callback # [phone] (Patient states a call back this afternoon after 1pm is best).

## 2022-12-29 NOTE — Progress Notes (Unsigned)
[name] OFFICE PROGRESS NOTE  [name], MD [address] [place] [address]  DIAGNOSIS: Stage IIIA (T2b, N2, M0) non-small cell lung cancer, squamous cell carcinoma presented with large left lower lobe lung mass in addition to left hilar and suspicious mediastinal lymphadenopathy diagnosed in May 2024.   Molecular studies by [id number] showed no actionable mutations and PD-L1 expression of 2%.  PRIOR THERAPY: A course of concurrent chemoradiation with weekly carboplatin for AUC of 2 and paclitaxel 45 Mg/M2. First dose July 21, 2022. Status post 7 cycles. Last dose was given on 09/01/2022.   CURRENT THERAPY: Consolidation treatment with immunotherapy with Imfinzi 1500 Mg IV every 4 weeks. First dose 10/09/2022. Status post 3 cycles.   INTERVAL HISTORY: Barbara Green 82 y.o. female returns to the clinic today for follow-up visit.  The patient was last seen by Dr. Arbutus Ped and myself 4 weeks ago.  The patient has been struggling with some ongoing dizziness.  She had a repeat brain MRI which was negative for metastatic disease to the brain.  Who recommended further evaluation with her PCP since her symptoms have been ongoing since prior to initiating her treatment.  The patient has been struggling with ongoing hyponatremia of unclear etiology.  She has been taking sodium packets and only drinking Gatorade and limiting her free water use.  She is still taking demeclocycline.  She was referred to nephrology at her last appointment due to her hyponatremia and she is scheduled to see them on ***  Otherwise she denies any major changes in her health.  She has a stable dry cough at nighttime.  Denies any chest discomfort or hemoptysis.  Denies any sick contacts.  Shortness of breath?  She denies any nausea or vomiting.  Denies any diarrhea or constipation.  Denies any headache or visual changes.  She recently had a restaging CT scan performed.  She is here today for  evaluation to review her scan results.    MEDICAL HISTORY: Past Medical History:  Diagnosis Date   History of radiation therapy 06/29/19-07/29/19   Right breast IMRT- Dr. Roselind Messier   History of radiation therapy    Left Lung- 07/24/22-09/08/22- Dr. Antony Blackbird   Hypertension     ALLERGIES:  has No Known Allergies.  MEDICATIONS:  Current Outpatient Medications  Medication Sig Dispense Refill   acetaminophen (TYLENOL) 325 MG tablet Take 2 tablets (650 mg total) by mouth every 6 (six) hours as needed for mild pain (or Fever >/= 101). (Patient not taking: Reported on 12/04/2022) 20 tablet 0   BONINE 25 MG CHEW Chew 25-50 mg by mouth daily as needed (for vertigo).     demeclocycline (DECLOMYCIN) 150 MG tablet Take 1 tablet (150 mg total) by mouth 2 (two) times daily. 60 tablet 2   lidocaine-prilocaine (EMLA) cream Apply 1 Application topically as needed. 30 g 2   lisinopril (ZESTRIL) 5 MG tablet Take 1 tablet (5 mg total) by mouth daily. 90 tablet 1   nicotine (NICODERM CQ - DOSED IN MG/24 HOURS) 14 mg/24hr patch Place 1 patch (14 mg total) onto the skin daily. 30 patch 0   ondansetron (ZOFRAN) 4 MG tablet Take 1 tablet (4 mg total) by mouth every 6 (six) hours as needed for nausea. 20 tablet 0   ondansetron (ZOFRAN) 8 MG tablet Take 1 tablet (8 mg total) by mouth every 8 (eight) hours as needed for nausea or vomiting. 30 tablet 2   OVER THE COUNTER MEDICATION 2 (  two) times daily. LMNT electrolyte drink mix- 1,000 mg sodium, 200 mg potasium, 60 mg magnesium     No current facility-administered medications for this visit.    SURGICAL HISTORY:  Past Surgical History:  Procedure Laterality Date   BREAST LUMPECTOMY WITH SENTINEL LYMPH NODE BIOPSY Right 05/17/2019   Procedure: RIGHT BREAST LUMPECTOMY WITH SENTINEL LYMPH NODE BIOPSY;  Surgeon: Abigail Miyamoto, MD;  Location: [facility];  Service: General;  Laterality: Right;   BRONCHIAL BIOPSY  06/12/2022   Procedure: BRONCHIAL  BIOPSIES;  Surgeon: Omar Person, MD;  Location: [facility] ENDOSCOPY;  Service: Pulmonary;;   BRONCHIAL NEEDLE ASPIRATION BIOPSY  06/12/2022   Procedure: BRONCHIAL NEEDLE ASPIRATION BIOPSIES;  Surgeon: Omar Person, MD;  Location: [facility] ENDOSCOPY;  Service: Pulmonary;;   FIDUCIAL MARKER PLACEMENT  06/12/2022   Procedure: FIDUCIAL MARKER PLACEMENT;  Surgeon: Omar Person, MD;  Location: [facility] ENDOSCOPY;  Service: Pulmonary;;   IR IMAGING GUIDED PORT INSERTION  11/12/2022   NO PAST SURGERIES     VIDEO BRONCHOSCOPY WITH ENDOBRONCHIAL ULTRASOUND N/A 06/12/2022   Procedure: VIDEO BRONCHOSCOPY WITH ENDOBRONCHIAL ULTRASOUND;  Surgeon: Omar Person, MD;  Location: [facility] ENDOSCOPY;  Service: Pulmonary;  Laterality: N/A;    REVIEW OF SYSTEMS:   Review of Systems  Constitutional: Negative for appetite change, chills, fatigue, fever and unexpected weight change.  HENT:   Negative for mouth sores, nosebleeds, sore throat and trouble swallowing.   Eyes: Negative for eye problems and icterus.  Respiratory: Negative for cough, hemoptysis, shortness of breath and wheezing.   Cardiovascular: Negative for chest pain and leg swelling.  Gastrointestinal: Negative for abdominal pain, constipation, diarrhea, nausea and vomiting.  Genitourinary: Negative for bladder incontinence, difficulty urinating, dysuria, frequency and hematuria.   Musculoskeletal: Negative for back pain, gait problem, neck pain and neck stiffness.  Skin: Negative for itching and rash.  Neurological: Negative for dizziness, extremity weakness, gait problem, headaches, light-headedness and seizures.  Hematological: Negative for adenopathy. Does not bruise/bleed easily.  Psychiatric/Behavioral: Negative for confusion, depression and sleep disturbance. The patient is not nervous/anxious.     PHYSICAL EXAMINATION:  There were no vitals taken for this visit.  ECOG PERFORMANCE STATUS: {CHL ONC ECOG [id number]  Physical Exam   Constitutional: Oriented to person, place, and time and well-developed, well-nourished, and in no distress. No distress.  HENT:  Head: Normocephalic and atraumatic.  Mouth/Throat: Oropharynx is clear and moist. No oropharyngeal exudate.  Eyes: Conjunctivae are normal. Right eye exhibits no discharge. Left eye exhibits no discharge. No scleral icterus.  Neck: Normal range of motion. Neck supple.  Cardiovascular: Normal rate, regular rhythm, normal heart sounds and intact distal pulses.   Pulmonary/Chest: Effort normal and breath sounds normal. No respiratory distress. No wheezes. No rales.  Abdominal: Soft. Bowel sounds are normal. Exhibits no distension and no mass. There is no tenderness.  Musculoskeletal: Normal range of motion. Exhibits no edema.  Lymphadenopathy:    No cervical adenopathy.  Neurological: Alert and oriented to person, place, and time. Exhibits normal muscle tone. Gait normal. Coordination normal.  Skin: Skin is warm and dry. No rash noted. Not diaphoretic. No erythema. No pallor.  Psychiatric: Mood, memory and judgment normal.  Vitals reviewed.  LABORATORY DATA: Lab Results  Component Value Date   WBC 9.2 12/04/2022   HGB 10.6 (L) 12/04/2022   HCT 32.0 (L) 12/04/2022   MCV 86.0 12/04/2022   PLT 412 (H) 12/04/2022      Chemistry      Component Value Date/Time  NA 130 (L) 12/04/2022 1002   NA 124 (L) 05/26/2022 1323   K 3.9 12/04/2022 1002   CL 96 (L) 12/04/2022 1002   CO2 27 12/04/2022 1002   BUN 8 12/04/2022 1002   BUN 7 (L) 05/26/2022 1323   CREATININE <0.30 (L) 12/04/2022 1002      Component Value Date/Time   CALCIUM 8.9 12/04/2022 1002   ALKPHOS 81 12/04/2022 1002   AST 17 12/04/2022 1002   ALT 13 12/04/2022 1002   BILITOT 0.4 12/04/2022 1002       RADIOGRAPHIC STUDIES:  MR Brain W Wo Contrast  Result Date: 12/04/2022 CLINICAL DATA:  Metastatic disease evaluation. EXAM: MRI HEAD WITHOUT AND WITH CONTRAST TECHNIQUE: Multiplanar, multiecho  pulse sequences of the brain and surrounding structures were obtained without and with intravenous contrast. CONTRAST:  4mL GADAVIST GADOBUTROL 1 MMOL/ML IV SOLN COMPARISON:  Brain MRI 07/07/2022 FINDINGS: Brain: There is no acute intracranial hemorrhage, extra-axial fluid collection, or acute infarct. There is mild parenchymal volume loss with prominence of the ventricular system and extra-axial CSF spaces. Patchy and confluent FLAIR signal abnormality in the supratentorial white matter likely reflects sequela of moderate underlying chronic small-vessel ischemic change. There is no cortical encephalomalacia. There are no chronic blood products. The pituitary and suprasellar region are normal there is no mass lesion or abnormal enhancement. There is no mass effect or midline shift. Vascular: Choose Skull and upper cervical spine: Heterogeneous marrow signal is again seen throughout the calvarium and imaged upper cervical spine. Sinuses/Orbits: The imaged paranasal sinuses are clear. The globes and orbits are unremarkable. Other: The mastoid air cells and middle ear cavities are clear. IMPRESSION: 1. No evidence of intracranial metastatic disease. 2. Unchanged nonspecific marrow signal throughout the calvarium and imaged upper cervical spine. Electronically Signed   By: Lesia Hausen M.D.   On: 12/04/2022 08:42     ASSESSMENT/PLAN:  This is a very pleasant 82 year old Caucasian female diagnosed with stage IIIa (T2 would be, N2, M0) non-small cell lung cancer, squamous cell carcinoma.  She presented with a left lower lobe lung mass in addition to ipsilateral infrahilar nodal metastasis.  She was diagnosed in June 2024.   She is not a good candidate for surgical resection.   She underwent a course of concurrent chemoradiation with weekly carboplatin for AUC of 2 and paclitaxel 45 mg/m.  She is status post 7 cycles.  The last dose was given on 09/01/2022.   She is currently undergoing consolidation immunotherapy  with Imfinzi 1500 mg IV every 4 weeks.  She is status post 3 cycles.  She had called the clinic not feeling well in the interval and was found to have multilobar pneumonia.  She was treated with Ceftin and azithromycin.  We will be arranging for surveillance imaging and 1-2 more cycles of treatment.     The patient was seen with Dr. Arbutus Ped today.  Dr. Arbutus Ped personally and independently reviewed the scan and discussed results with the patient today.  The scan showed ***.  Dr. Arbutus Ped recommends ***  She also had a repeat brain MRI which was negative for metastatic disease to the brain.   Labs were reviewed. Recommend she *** with cycle #4 today as scheduled.   We will see her back for follow-up visit in 4 weeks for evaluation and repeat blood work before undergoing cycle #4.    The patient was advised to call immediately if she has any concerning symptoms in the interval. The patient voices understanding of current  disease status and treatment options and is in agreement with the current care plan. All questions were answered. The patient knows to call the clinic with any problems, questions or concerns. We can certainly see the patient much sooner if necessary        No orders of the defined types were placed in this encounter.    I spent {CHL ONC TIME VISIT - YTKZS:[id number]} counseling the patient face to face. The total time spent in the appointment was {CHL ONC TIME VISIT - DUKGU:[id number]}.  Jaxson Anglin L Theodore Virgin, PA-C 12/29/22

## 2022-12-31 NOTE — Telephone Encounter (Signed)
Called patient left voicemail to call office back so we can get her scheduled

## 2023-01-01 ENCOUNTER — Inpatient Hospital Stay: Payer: Medicare HMO | Admitting: Physician Assistant

## 2023-01-01 ENCOUNTER — Inpatient Hospital Stay: Payer: Medicare HMO

## 2023-01-01 ENCOUNTER — Inpatient Hospital Stay: Payer: Medicare HMO | Attending: Physician Assistant

## 2023-01-01 ENCOUNTER — Telehealth: Payer: Self-pay | Admitting: Family Medicine

## 2023-01-01 VITALS — BP 124/61 | HR 89

## 2023-01-01 VITALS — BP 143/85 | HR 111 | Temp 98.3°F | Resp 16 | Wt 98.0 lb

## 2023-01-01 DIAGNOSIS — Z170 Estrogen receptor positive status [ER+]: Secondary | ICD-10-CM

## 2023-01-01 DIAGNOSIS — C3492 Malignant neoplasm of unspecified part of left bronchus or lung: Secondary | ICD-10-CM

## 2023-01-01 DIAGNOSIS — Z5112 Encounter for antineoplastic immunotherapy: Secondary | ICD-10-CM

## 2023-01-01 DIAGNOSIS — C3432 Malignant neoplasm of lower lobe, left bronchus or lung: Secondary | ICD-10-CM | POA: Insufficient documentation

## 2023-01-01 DIAGNOSIS — C50211 Malignant neoplasm of upper-inner quadrant of right female breast: Secondary | ICD-10-CM

## 2023-01-01 DIAGNOSIS — C3490 Malignant neoplasm of unspecified part of unspecified bronchus or lung: Secondary | ICD-10-CM | POA: Diagnosis not present

## 2023-01-01 DIAGNOSIS — Z5111 Encounter for antineoplastic chemotherapy: Secondary | ICD-10-CM | POA: Diagnosis present

## 2023-01-01 DIAGNOSIS — Z923 Personal history of irradiation: Secondary | ICD-10-CM | POA: Insufficient documentation

## 2023-01-01 DIAGNOSIS — Z79899 Other long term (current) drug therapy: Secondary | ICD-10-CM | POA: Insufficient documentation

## 2023-01-01 LAB — CMP (CANCER CENTER ONLY)
ALT: 12 U/L (ref 0–44)
AST: 16 U/L (ref 15–41)
Albumin: 3.8 g/dL (ref 3.5–5.0)
Alkaline Phosphatase: 81 U/L (ref 38–126)
Anion gap: 8 (ref 5–15)
BUN: 9 mg/dL (ref 8–23)
CO2: 25 mmol/L (ref 22–32)
Calcium: 9 mg/dL (ref 8.9–10.3)
Chloride: 97 mmol/L — ABNORMAL LOW (ref 98–111)
Creatinine: 0.34 mg/dL — ABNORMAL LOW (ref 0.44–1.00)
GFR, Estimated: >60 mL/min (ref >60)
Glucose, Bld: 96 mg/dL (ref 70–99)
Potassium: 4 mmol/L (ref 3.5–5.1)
Sodium: 130 mmol/L — ABNORMAL LOW (ref 135–145)
Total Bilirubin: 0.3 mg/dL (ref <1.2)
Total Protein: 6.4 g/dL — ABNORMAL LOW (ref 6.5–8.1)

## 2023-01-01 LAB — CBC WITH DIFFERENTIAL (CANCER CENTER ONLY)
Abs Immature Granulocytes: 0.03 10*3/uL (ref 0.00–0.07)
Basophils Absolute: 0.1 10*3/uL (ref 0.0–0.1)
Basophils Relative: 1 %
Eosinophils Absolute: 0.1 10*3/uL (ref 0.0–0.5)
Eosinophils Relative: 1 %
HCT: 36.4 % (ref 36.0–46.0)
Hemoglobin: 12.1 g/dL (ref 12.0–15.0)
Immature Granulocytes: 0 %
Lymphocytes Relative: 18 %
Lymphs Abs: 1.8 10*3/uL (ref 0.7–4.0)
MCH: 28.3 pg (ref 26.0–34.0)
MCHC: 33.2 g/dL (ref 30.0–36.0)
MCV: 85.2 fL (ref 80.0–100.0)
Monocytes Absolute: 1.3 10*3/uL — ABNORMAL HIGH (ref 0.1–1.0)
Monocytes Relative: 13 %
Neutro Abs: 6.5 10*3/uL (ref 1.7–7.7)
Neutrophils Relative %: 67 %
Platelet Count: 298 10*3/uL (ref 150–400)
RBC: 4.27 MIL/uL (ref 3.87–5.11)
RDW: 14.3 % (ref 11.5–15.5)
WBC Count: 9.7 10*3/uL (ref 4.0–10.5)
nRBC: 0 % (ref 0.0–0.2)

## 2023-01-01 MED ORDER — SODIUM CHLORIDE 0.9% FLUSH
10.0000 mL | Freq: Once | INTRAVENOUS | Status: AC
  Administered 2023-01-01: 10 mL

## 2023-01-01 MED ORDER — SODIUM CHLORIDE 0.9 % IV SOLN: Freq: Once | INTRAVENOUS | Status: AC

## 2023-01-01 MED ORDER — HEPARIN SOD (PORK) LOCK FLUSH 100 UNIT/ML IV SOLN
250.0000 [IU] | Freq: Once | INTRAVENOUS | Status: AC
  Administered 2023-01-01: 250 [IU]

## 2023-01-01 MED ORDER — SODIUM CHLORIDE 0.9 % IV SOLN
1500.0000 mg | Freq: Once | INTRAVENOUS | Status: AC
  Administered 2023-01-01: 1500 mg via INTRAVENOUS
  Filled 2023-01-01: qty 30

## 2023-01-01 MED ORDER — SODIUM CHLORIDE 0.9% FLUSH
10.0000 mL | INTRAVENOUS | Status: DC | PRN
Start: 2023-01-01 — End: 2023-01-01
  Administered 2023-01-01: 10 mL

## 2023-01-01 MED ORDER — HEPARIN SOD (PORK) LOCK FLUSH 100 UNIT/ML IV SOLN
500.0000 [IU] | Freq: Once | INTRAVENOUS | Status: AC | PRN
  Administered 2023-01-01: 500 [IU]

## 2023-01-01 NOTE — Telephone Encounter (Signed)
Spoke with daughter Reymundo Poll , she will call her mother to let her know the severity of her having a in- person appointment. She stated her concerns about her mother's sodium intake- sodium levels are decreasing and she is up to 5000 mL a day of Gatorade or electrolytes. Oncologist told her that drinking water was making it worse. I told the daughter that we need to do an full assessment in office , also in case labs need to be done before just prescribing medication for her dizziness.

## 2023-01-01 NOTE — Telephone Encounter (Signed)
Pt returned call reporting that a video visit will not work for her

## 2023-01-01 NOTE — Patient Instructions (Signed)
[facility] - A DEPT OF [facility]  Discharge Instructions: Thank you for choosing [facility] to provide your oncology and hematology care.   If you have a lab appointment with the Cancer Center, please go directly to the Cancer Center and check in at the registration area.   Wear comfortable clothing and clothing appropriate for easy access to any Portacath or PICC line.   We strive to give you quality time with your provider. You may need to reschedule your appointment if you arrive late (15 or more minutes).  Arriving late affects you and other patients whose appointments are after yours.  Also, if you miss three or more appointments without notifying the office, you may be dismissed from the clinic at the provider's discretion.      For prescription refill requests, have your pharmacy contact our office and allow 72 hours for refills to be completed.    Today you received the following chemotherapy and/or immunotherapy agents: Imfinzi      To help prevent nausea and vomiting after your treatment, we encourage you to take your nausea medication as directed.  BELOW ARE SYMPTOMS THAT SHOULD BE REPORTED IMMEDIATELY: *FEVER GREATER THAN 100.4 F (38 C) OR HIGHER *CHILLS OR SWEATING *NAUSEA AND VOMITING THAT IS NOT CONTROLLED WITH YOUR NAUSEA MEDICATION *UNUSUAL SHORTNESS OF BREATH *UNUSUAL BRUISING OR BLEEDING *URINARY PROBLEMS (pain or burning when urinating, or frequent urination) *BOWEL PROBLEMS (unusual diarrhea, constipation, pain near the anus) TENDERNESS IN MOUTH AND THROAT WITH OR WITHOUT PRESENCE OF ULCERS (sore throat, sores in mouth, or a toothache) UNUSUAL RASH, SWELLING OR PAIN  UNUSUAL VAGINAL DISCHARGE OR ITCHING   Items with * indicate a potential emergency and should be followed up as soon as possible or go to the Emergency Department if any problems should occur.  Please show the CHEMOTHERAPY ALERT CARD or IMMUNOTHERAPY  ALERT CARD at check-in to the Emergency Department and triage nurse.  Should you have questions after your visit or need to cancel or reschedule your appointment, please contact [facility] - A DEPT OF [facility]  Dept: [phone]  and follow the prompts.  Office hours are 8:00 a.m. to 4:30 p.m. Monday - Friday. Please note that voicemails left after 4:00 p.m. may not be returned until the following business day.  We are closed weekends and major holidays. You have access to a nurse at all times for urgent questions. Please call the main number to the clinic Dept: [phone] and follow the prompts.   For any non-urgent questions, you may also contact your provider using MyChart. We now offer e-Visits for anyone 80 and older to request care online for non-urgent symptoms. For details visit [email or web address].   Also download the MyChart app! Go to the app store, search "MyChart", open the app, select , and log in with your MyChart username and password.

## 2023-01-01 NOTE — Telephone Encounter (Signed)
I called patient and did not get a answer.  I left a message on the voicemail to return the call.

## 2023-01-01 NOTE — Telephone Encounter (Signed)
Called patient daughter that's listed on patients chart since couldn't get in touch with Ms.Cahoon, no answer left voicemail to call office back so we can get her scheduled for video visit with provider

## 2023-01-01 NOTE — Telephone Encounter (Signed)
Called patient back patient stated she couldn't wait til 12/24 for appointment that was scheduled with [facility] , I let her know I can get in her on 12/6 in the am she stated she had to many things scheduled and I offered her Monday she declined stated she has a lot going on and other doctors are trying to schedule her for radiation so she's not sure when she would have the time to come, stated she just needs something called in for dizziness. Please advise.

## 2023-01-09 ENCOUNTER — Encounter (HOSPITAL_COMMUNITY)
Admission: RE | Admit: 2023-01-09 | Discharge: 2023-01-09 | Disposition: A | Discharge status: Home / Self Care | Payer: Medicare HMO | Source: Ambulatory Visit | Attending: Physician Assistant

## 2023-01-09 DIAGNOSIS — C3490 Malignant neoplasm of unspecified part of unspecified bronchus or lung: Secondary | ICD-10-CM | POA: Insufficient documentation

## 2023-01-09 LAB — GLUCOSE, CAPILLARY: Glucose-Capillary: 101 mg/dL — ABNORMAL HIGH (ref 70–99)

## 2023-01-09 MED ORDER — FLUDEOXYGLUCOSE F - 18 (FDG) INJECTION: 5.0000 | Freq: Once | INTRAVENOUS | Status: DC | PRN

## 2023-01-09 MED ORDER — FLUDEOXYGLUCOSE F - 18 (FDG) INJECTION
4.9000 | Freq: Once | INTRAVENOUS | Status: AC
  Administered 2023-01-09: 4.99 via INTRAVENOUS

## 2023-01-15 ENCOUNTER — Ambulatory Visit
Admission: RE | Admit: 2023-01-15 | Discharge: 2023-01-15 | Disposition: A | Discharge status: Home / Self Care | Payer: Medicare HMO | Source: Ambulatory Visit | Attending: Radiation Oncology | Admitting: Radiation Oncology

## 2023-01-15 DIAGNOSIS — C50211 Malignant neoplasm of upper-inner quadrant of right female breast: Secondary | ICD-10-CM

## 2023-01-20 ENCOUNTER — Ambulatory Visit: Payer: Medicare HMO | Admitting: Family Medicine

## 2023-01-27 NOTE — Progress Notes (Signed)
 [facility] OFFICE PROGRESS NOTE  [name], MD [address] [place] [address]  DIAGNOSIS: Stage IV lung cancer, initially diagnosed as Stage IIIA (T2b, N2, M0) non-small cell lung cancer, squamous cell carcinoma presented with large left lower lobe lung mass in addition to left hilar and suspicious mediastinal lymphadenopathy diagnosed in May 2024. She developed a new adrenal gland metastasis and suspected bilateral pulmonary metastases.    Molecular studies by [id number] showed no actionable mutations and PD-L1 expression of 2%.  PRIOR THERAPY:  1) A course of concurrent chemoradiation with weekly carboplatin  for AUC of 2 and paclitaxel  45 Mg/M2. First dose July 21, 2022. Status post 7 cycles. Last dose was given on 09/01/2022. 2) Consolidation treatment with immunotherapy with Imfinzi  1500 Mg IV every 4 weeks. First dose 10/09/2022. Status post 4 cycles.   CURRENT THERAPY: None  INTERVAL HISTORY: Barbara Green 82 y.o. female returns  to the clinic today for a follow-up visit.  The patient was last seen by myself and Dr. Sherrod 4 weeks ago. Dr. Sherrod recommended PET scan due to her CT scan showing new 6 mm and 3 mm right lower lobe pulmonary nodules and new left adrenal nodule.   The patient has been struggling with some ongoing dizziness.  She had a repeat brain MRI which was negative for metastatic disease to the brain.  Therefore, we recommended further evaluation with her PCP since her symptoms have been ongoing since prior to initiating her treatment. The patient has been struggling with ongoing hyponatremia of unclear etiology.  She has been taking sodium packets and only drinking Gatorade and limiting her free water use. She was not able to connect with her PCP since last being seen. We also referred her to nephrology. She has an appointment on 02/18/22.   Overall, her dizziness is better today. Otherwise she denies any major changes in her  health.  She states she rarely coughs.  Denies any chest discomfort or hemoptysis.  She denies any significant shortness of breath and states that it got better. Denies any sick contacts.  She denies any nausea or vomiting.  Denies any diarrhea or constipation.  Denies any headache or visual changes. She denies rashes. She recently had a restaging PET scan.  She is here today for evaluation and repeat blood work before undergoing cycle #5     MEDICAL HISTORY: Past Medical History:  Diagnosis Date   History of radiation therapy 06/29/19-07/29/19   Right breast IMRT- Dr. Shannon   History of radiation therapy    Left Lung- 07/24/22-09/08/22- Dr. Lynwood Shannon   Hypertension     ALLERGIES:  has no known allergies.  MEDICATIONS:  Current Outpatient Medications  Medication Sig Dispense Refill   acetaminophen  (TYLENOL ) 325 MG tablet Take 2 tablets (650 mg total) by mouth every 6 (six) hours as needed for mild pain (or Fever >/= 101). (Patient not taking: Reported on 12/04/2022) 20 tablet 0   BONINE 25 MG CHEW Chew 25-50 mg by mouth daily as needed (for vertigo).     demeclocycline (DECLOMYCIN) 150 MG tablet Take 1 tablet (150 mg total) by mouth 2 (two) times daily. 60 tablet 2   lidocaine -prilocaine  (EMLA ) cream Apply 1 Application topically as needed. 30 g 2   lisinopril  (ZESTRIL ) 5 MG tablet Take 1 tablet (5 mg total) by mouth daily. 90 tablet 1   nicotine  (NICODERM CQ  - DOSED IN MG/24 HOURS) 14 mg/24hr patch Place 1 patch (14 mg total) onto  the skin daily. 30 patch 0   ondansetron  (ZOFRAN ) 4 MG tablet Take 1 tablet (4 mg total) by mouth every 6 (six) hours as needed for nausea. 20 tablet 0   ondansetron  (ZOFRAN ) 8 MG tablet Take 1 tablet (8 mg total) by mouth every 8 (eight) hours as needed for nausea or vomiting. 30 tablet 2   OVER THE COUNTER MEDICATION 2 (two) times daily. LMNT electrolyte drink mix- 1,000 mg sodium, 200 mg potasium, 60 mg magnesium      No current facility-administered  medications for this visit.    SURGICAL HISTORY:  Past Surgical History:  Procedure Laterality Date   BREAST LUMPECTOMY WITH SENTINEL LYMPH NODE BIOPSY Right 05/17/2019   Procedure: RIGHT BREAST LUMPECTOMY WITH SENTINEL LYMPH NODE BIOPSY;  Surgeon: Vernetta Berg, MD;  Location: [facility];  Service: General;  Laterality: Right;   BRONCHIAL BIOPSY  06/12/2022   Procedure: BRONCHIAL BIOPSIES;  Surgeon: Gladis Leonor HERO, MD;  Location: [facility] ENDOSCOPY;  Service: Pulmonary;;   BRONCHIAL NEEDLE ASPIRATION BIOPSY  06/12/2022   Procedure: BRONCHIAL NEEDLE ASPIRATION BIOPSIES;  Surgeon: Gladis Leonor HERO, MD;  Location: [facility] ENDOSCOPY;  Service: Pulmonary;;   FIDUCIAL MARKER PLACEMENT  06/12/2022   Procedure: FIDUCIAL MARKER PLACEMENT;  Surgeon: Gladis Leonor HERO, MD;  Location: [facility] ENDOSCOPY;  Service: Pulmonary;;   IR IMAGING GUIDED PORT INSERTION  11/12/2022   NO PAST SURGERIES     VIDEO BRONCHOSCOPY WITH ENDOBRONCHIAL ULTRASOUND N/A 06/12/2022   Procedure: VIDEO BRONCHOSCOPY WITH ENDOBRONCHIAL ULTRASOUND;  Surgeon: Gladis Leonor HERO, MD;  Location: [facility] ENDOSCOPY;  Service: Pulmonary;  Laterality: N/A;    REVIEW OF SYSTEMS:   Review of Systems  Constitutional: Negative for appetite change, chills, fatigue, fever and unexpected weight change.  HENT: Negative for mouth sores, nosebleeds, sore throat and trouble swallowing.   Eyes: Negative for eye problems and icterus.  Respiratory: Negative for cough, hemoptysis, shortness of breath and wheezing.   Cardiovascular: Negative for chest pain and leg swelling.  Gastrointestinal: Negative for abdominal pain, constipation, diarrhea, nausea and vomiting.  Genitourinary: Negative for bladder incontinence, difficulty urinating, dysuria, frequency and hematuria.   Musculoskeletal: Negative for back pain, gait problem, neck pain and neck stiffness.  Skin: Negative for itching and rash.  Neurological: Positive for intermittent dizziness.  Negative for extremity weakness, gait problem, headaches, light-headedness and seizures.  Hematological: Negative for adenopathy. Does not bruise/bleed easily.  Psychiatric/Behavioral: Negative for confusion, depression and sleep disturbance. The patient is not nervous/anxious.     PHYSICAL EXAMINATION:  There were no vitals taken for this visit.  ECOG PERFORMANCE STATUS: 1  Physical Exam  Constitutional: Oriented to person, place, and time and thin appearing female and in no distress.  HENT:  Head: Normocephalic and atraumatic.  Mouth/Throat: Oropharynx is clear and moist. No oropharyngeal exudate.  Eyes: Conjunctivae are normal. Right eye exhibits no discharge. Left eye exhibits no discharge. No scleral icterus.  Neck: Normal range of motion. Neck supple.  Cardiovascular: Normal rate, regular rhythm, normal heart sounds and intact distal pulses.   Pulmonary/Chest: Effort normal and breath sounds normal. No respiratory distress. No wheezes. No rales.  Abdominal: Soft. Bowel sounds are normal. Exhibits no distension and no mass. There is no tenderness.  Musculoskeletal: Normal range of motion. Exhibits no edema.  Lymphadenopathy:    No cervical adenopathy.  Neurological: Alert and oriented to person, place, and time. Exhibits muscle wasting.  Gait normal. Coordination normal. She has a cane for ambulation but she does not use it.  Skin: Skin is warm and dry. No rash noted. Not diaphoretic. No erythema. No pallor.  Psychiatric: Mood, memory and judgment normal.  Vitals reviewed.  LABORATORY DATA: Lab Results  Component Value Date   WBC 9.7 01/01/2023   HGB 12.1 01/01/2023   HCT 36.4 01/01/2023   MCV 85.2 01/01/2023   PLT 298 01/01/2023      Chemistry      Component Value Date/Time   NA 130 (L) 01/01/2023 0800   NA 124 (L) 05/26/2022 1323   K 4.0 01/01/2023 0800   CL 97 (L) 01/01/2023 0800   CO2 25 01/01/2023 0800   BUN 9 01/01/2023 0800   BUN 7 (L) 05/26/2022 1323    CREATININE 0.34 (L) 01/01/2023 0800      Component Value Date/Time   CALCIUM 9.0 01/01/2023 0800   ALKPHOS 81 01/01/2023 0800   AST 16 01/01/2023 0800   ALT 12 01/01/2023 0800   BILITOT 0.3 01/01/2023 0800       RADIOGRAPHIC STUDIES:  MM 3D DIAGNOSTIC MAMMOGRAM BILATERAL BREAST Addendum Date: 01/20/2023 ADDENDUM REPORT: 01/20/2023 11:48 ADDENDUM: In the Clinical Data section, in order to correct a voice recognition dictation mistake, the patient's prior malignant lumpectomy was in 2021. Electronically Signed   By: Debby Satterfield M.D.   On: 01/20/2023 11:48   Result Date: 01/20/2023 CLINICAL DATA:  82 year old who underwent malignant lumpectomy of the far inner RIGHT breast adjacent to the sternum in 1,021 with adjuvant radiation therapy. EXAM: DIGITAL DIAGNOSTIC BILATERAL MAMMOGRAM WITH TOMOSYNTHESIS AND CAD TECHNIQUE: Bilateral digital diagnostic mammography and breast tomosynthesis was performed. The images were evaluated with computer-aided detection. COMPARISON:  Previous exam(s). ACR Breast Density Category c: The breasts are heterogeneously dense, which may obscure small masses. FINDINGS: Full field CC and MLO views of both breasts and an attempt at a spot magnification medially exaggerated CC view were obtained. RIGHT: No findings suspicious for malignancy. Residual skin thickening and trabecular thickening, predominantly in the inner and lower breast, unchanged from the 12/11/2021 mammogram. The surgical site is so far medial that it was not included on the images. LEFT: No findings suspicious for malignancy. IMPRESSION: 1. No mammographic evidence of malignancy involving either breast. 2. Stable post radiation changes involving the RIGHT breast. RECOMMENDATION: Per protocol, as the patient is now 2 or more years status post lumpectomy, she may return to annual screening mammography in 1 year. However, given the history of breast cancer, the patient remains eligible for annual diagnostic  mammography if preferred. I have discussed the findings and recommendations with the patient. If applicable, a reminder letter will be sent to the patient regarding the next appointment. BI-RADS CATEGORY  2: Benign. Electronically Signed: By: Debby Satterfield M.D. On: 01/15/2023 15:43   NM PET Image Restag (PS) Skull Base To Thigh Result Date: 01/18/2023 CLINICAL DATA:  Subsequent treatment strategy for non-small cell lung cancer with new adrenal lesion. EXAM: NUCLEAR MEDICINE PET SKULL BASE TO THIGH TECHNIQUE: 5.0 mCi F-18 FDG was injected intravenously. Full-ring PET imaging was performed from the skull base to thigh after the radiotracer. CT data was obtained and used for attenuation correction and anatomic localization. Fasting blood glucose: 101 mg/dl COMPARISON:  CT chest dated 12/26/2022 FINDINGS: Mediastinal blood pool activity: SUV max 1.7 Liver activity: SUV max NA NECK: No hypermetabolic lymph nodes in the neck. Incidental CT findings: None. CHEST: 2.3 cm mildly thick-walled cavitary lesion in the left lower lobe (series 7/image 37), max SUV 3.3. Associated 3.2 cm rounded/masslike opacity in the  posterior left lower lobe (series 7/image 43), max SUV 4.3. This corresponds to the patient's known primary bronchogenic carcinoma, unchanged from the recent prior, improved from more remote priors. Two right lower lobe pulmonary nodules measuring 5 and 7 mm (series 7/images 35 and 37), max SUV 3.7, suspicious for small metastases. Additional 8 mm nodule in the medial left lower lobe (series 7, image 49), max SUV 9.4, also suspicious. Radiation changes in the anterior right hemithorax. Additional multifocal patchy opacities in the left lung, possibly infectious inflammatory. No hypermetabolic thoracic lymphadenopathy. Trace left pleural effusion. Right chest port terminates at the cavoatrial junction. Incidental CT findings: Atherosclerotic calcifications of the aortic arch. Mild coronary atherosclerosis of  the LAD. ABDOMEN/PELVIS: 2.4 cm left adrenal nodule, grossly unchanged, max SUV 11.2. This is suspicious for adrenal metastasis. No abnormal metabolism in the liver, pancreas, or spleen. No hypermetabolic abdominopelvic lymphadenopathy. Incidental CT findings: Benign left renal cyst. Atherosclerotic calcifications of the abdominal aorta and branch vessels. Sigmoid diverticulosis, without evidence of diverticulitis. SKELETON: No focal hypermetabolic activity to suggest skeletal metastasis. Skin thickening involving the right breast, suggesting radiation changes. Incidental CT findings: Degenerative changes of the visualized thoracolumbar spine. IMPRESSION: Left lower lobe primary bronchogenic carcinoma, grossly unchanged. Suspected subcentimeter bilateral pulmonary metastases, as above. 2.4 cm left adrenal metastasis. Radiation changes in the right hemithorax. Additional patchy opacities in the left lung, possibly infectious/inflammatory. Electronically Signed   By: Pinkie Pebbles M.D.   On: 01/18/2023 05:00     ASSESSMENT/PLAN:  This is a very pleasant 82 year old Caucasian female diagnosed with stage IIIa (T2 would be, N2, M0) non-small cell lung cancer, squamous cell carcinoma.  She presented with a left lower lobe lung mass in addition to ipsilateral infrahilar nodal metastasis.  She was diagnosed in June 2024.   She is not a good candidate for surgical resection.   She underwent a course of concurrent chemoradiation with weekly carboplatin  for AUC of 2 and paclitaxel  45 mg/m.  She is status post 7 cycles.  The last dose was given on 09/01/2022.   She is currently undergoing consolidation immunotherapy with Imfinzi  1500 mg IV every 4 weeks.  She is status post 4 cycles.    At her last appointment, her restaging CT scan showed new 6 mm and 3 mm right lower lobe pulmonary nodules which could be a concern for metastatic disease. There was also new 2.3 x 1.5 cm left adrenal nodule.   The patient was  seen with Dr. Sherrod. She recently had a PET scan to further evaluate this. The patient was seen with Dr. Sherrod today.  Dr. Sherrod personally and independently reviewed the scan and discussed results with the patient today.  The scan showed grossly unchanged left lower lobe primary bronchogenic carcinoma, suspected subcentimeter bilateral pulmonary metastases, and a new 2.4 cm left adrenal metastasis.  Dr. Sherrod discussed that her current treatment is not working.  Dr. Sherrod recommends discontinuing her current treatment.  Dr. Sherrod discussed that this is the equivalent of stage IV.  Therefore all further treatment will be palliative in nature.  We do have treatment options for the patient if she is interested.  It is always an option to not pursue any treatment.  If she is interested in treatment, then Dr. Sherrod would recommend chemotherapy.  Dr. Sherrod is leaning towards chemotherapy with carboplatin  and gemcitabine on days 1 and 8 IV every 3 weeks.  Concern with tolerability of carboplatin  and Taxol  and immunotherapy and effectiveness considering she had carboplatin  and  Taxol  with her concurrent chemoradiation and immunotherapy with Imfinzi  with progression.  The patient would like to talk to her daughter before moving forward with chemotherapy.  They are going to call the clinic when her daughter is able to come in from out of town to attend an appointment.  She will not receive treatment today as it is not effective.  We will hopefully follow-up with the patient soon to discuss chemotherapy more detail if she is interested.  The patient was advised to call immediately if she has any concerning symptoms in the interval. The patient voices understanding of current disease status and treatment options and is in agreement with the current care plan. All questions were answered. The patient knows to call the clinic with any problems, questions or concerns. We can certainly see the patient  much sooner if necessary  No orders of the defined types were placed in this encounter.    Almin Livingstone L Stefan Markarian, PA-C 01/27/23  ADDENDUM: Hematology/Oncology Attending: I had a face-to-face encounter with the patient today.  I reviewed her records, lab, scan and recommended her care plan.  This is a very pleasant 82 years old white female with stage IV non-small cell lung cancer that was initially diagnosed as stage IIIa (T2b, N2, M0) squamous cell carcinoma in May 2024 status post a course of concurrent chemoradiation followed by 4 cycles of consolidation treatment with immunotherapy with Imfinzi .  She had recent CT scan of the chest on December 26, 2022 that showed new 0.6 and 0.3 cm right lower lobe pulmonary nodules concerning for metastatic disease.  There was also a new 2.3 x 1.5 cm left adrenal nodule suspicious for metastasis.  I ordered a PET scan which was performed on January 09, 2023 and it showed the left lower lobe primary bronchogenic carcinoma unchanged but the suspected subcentimeter bilateral pulmonary metastasis and hypermetabolic 2.4 cm left adrenal metastasis. I had a lengthy discussion with the patient today about her current condition and treatment options. I explained to the patient that she has disease progression and it is advisable to change her treatment at this point. She was giving the option of palliative care and hospice versus consideration of systemic chemotherapy likely with carboplatin  for AUC of 5 on day 1 and gemcitabine 1000 Mg/M2 on days 1 and 8 every 3 weeks.  We may consider reducing her dose because of her general condition and comorbidities. The patient will discuss this option with her daughter and we will arrange a follow-up appointment for her when her daughter is available to come to the meeting for additional discussion. The patient was advised to call immediately if she has any other concerning symptoms in the interval. The total time spent in  the appointment was 30 minutes. Disclaimer: This note was dictated with voice recognition software. Similar sounding words can inadvertently be transcribed and may be missed upon review. Sherrod MARLA Sherrod, MD

## 2023-01-29 ENCOUNTER — Inpatient Hospital Stay: Payer: Medicare HMO | Admitting: Physician Assistant

## 2023-01-29 ENCOUNTER — Inpatient Hospital Stay: Payer: Medicare HMO | Attending: Physician Assistant

## 2023-01-29 ENCOUNTER — Inpatient Hospital Stay: Payer: Medicare HMO

## 2023-01-29 ENCOUNTER — Other Ambulatory Visit: Payer: Self-pay | Admitting: Internal Medicine

## 2023-01-29 VITALS — BP 123/76 | HR 92 | Temp 98.1°F | Resp 16 | Wt 96.6 lb

## 2023-01-29 DIAGNOSIS — Z79899 Other long term (current) drug therapy: Secondary | ICD-10-CM | POA: Diagnosis not present

## 2023-01-29 DIAGNOSIS — C7972 Secondary malignant neoplasm of left adrenal gland: Secondary | ICD-10-CM | POA: Diagnosis not present

## 2023-01-29 DIAGNOSIS — C3432 Malignant neoplasm of lower lobe, left bronchus or lung: Secondary | ICD-10-CM | POA: Insufficient documentation

## 2023-01-29 DIAGNOSIS — Z7189 Other specified counseling: Secondary | ICD-10-CM

## 2023-01-29 DIAGNOSIS — Z853 Personal history of malignant neoplasm of breast: Secondary | ICD-10-CM | POA: Insufficient documentation

## 2023-01-29 DIAGNOSIS — Z923 Personal history of irradiation: Secondary | ICD-10-CM | POA: Diagnosis not present

## 2023-01-29 DIAGNOSIS — C3492 Malignant neoplasm of unspecified part of left bronchus or lung: Secondary | ICD-10-CM

## 2023-01-29 DIAGNOSIS — C50211 Malignant neoplasm of upper-inner quadrant of right female breast: Secondary | ICD-10-CM

## 2023-01-29 LAB — CBC WITH DIFFERENTIAL (CANCER CENTER ONLY)
Abs Immature Granulocytes: 0.05 10*3/uL (ref 0.00–0.07)
Basophils Absolute: 0.1 10*3/uL (ref 0.0–0.1)
Basophils Relative: 1 %
Eosinophils Absolute: 0.1 10*3/uL (ref 0.0–0.5)
Eosinophils Relative: 0 %
HCT: 35.1 % — ABNORMAL LOW (ref 36.0–46.0)
Hemoglobin: 12 g/dL (ref 12.0–15.0)
Immature Granulocytes: 0 %
Lymphocytes Relative: 19 %
Lymphs Abs: 2.1 10*3/uL (ref 0.7–4.0)
MCH: 28.6 pg (ref 26.0–34.0)
MCHC: 34.2 g/dL (ref 30.0–36.0)
MCV: 83.8 fL (ref 80.0–100.0)
Monocytes Absolute: 1.8 10*3/uL — ABNORMAL HIGH (ref 0.1–1.0)
Monocytes Relative: 16 %
Neutro Abs: 7.2 10*3/uL (ref 1.7–7.7)
Neutrophils Relative %: 64 %
Platelet Count: 334 10*3/uL (ref 150–400)
RBC: 4.19 MIL/uL (ref 3.87–5.11)
RDW: 14.6 % (ref 11.5–15.5)
WBC Count: 11.2 10*3/uL — ABNORMAL HIGH (ref 4.0–10.5)
nRBC: 0 % (ref 0.0–0.2)

## 2023-01-29 LAB — CMP (CANCER CENTER ONLY)
ALT: 12 U/L (ref 0–44)
AST: 15 U/L (ref 15–41)
Albumin: 3.8 g/dL (ref 3.5–5.0)
Alkaline Phosphatase: 92 U/L (ref 38–126)
Anion gap: 5 (ref 5–15)
BUN: 12 mg/dL (ref 8–23)
CO2: 27 mmol/L (ref 22–32)
Calcium: 9.3 mg/dL (ref 8.9–10.3)
Chloride: 97 mmol/L — ABNORMAL LOW (ref 98–111)
Creatinine: 0.33 mg/dL — ABNORMAL LOW (ref 0.44–1.00)
GFR, Estimated: >60 mL/min (ref >60)
Glucose, Bld: 87 mg/dL (ref 70–99)
Potassium: 4.1 mmol/L (ref 3.5–5.1)
Sodium: 129 mmol/L — ABNORMAL LOW (ref 135–145)
Total Bilirubin: 0.4 mg/dL (ref 0.0–1.2)
Total Protein: 6.6 g/dL (ref 6.5–8.1)

## 2023-01-29 MED ORDER — SODIUM CHLORIDE 0.9% FLUSH
10.0000 mL | Freq: Once | INTRAVENOUS | Status: AC
  Administered 2023-01-29: 10 mL

## 2023-01-29 NOTE — Patient Instructions (Signed)
 I am glad you enjoyed your holiday with your family.  -Your scan does show concerns that the cancer is growing. Therefore, the immunotherapy is not working. There is a new small cancer deposit on the adrenal gland which sits on top of your kidneys as well as some tiny nodules in both of your lungs which looks suspicious to be the cancer spreading.  Unfortunately, having the cancer moved to your other lung and to the adrenal gland does make this stage IV. Treatment would be palliative in nature, meaning it would prolong your life and try to control the cancer as long as we can.  We do have some treatment options though, all of which are chemo if you would like to pursue treatment.  -Therefore, we need to talk about changing treatment.  -Due to your age and concerns with tolerability, Dr. Sherrod would lean towards a two drug chemotherapy combination with carboplatin  (you had this before when you had radiation, however, the dose would be higher) and gemcitabine. This treatment is given one day a week for two weeks in a row. Then you get the 3rd week off. That is one cycle. Essentially each cycle would be two infusions on week 1 and week 2 every 3 weeks. Then that would repeat (two weeks on and one week off). This would be done for likely a total of 4-6 cycles.  -We will discuss this in more detail at your next visit. Please talk to your family about what you would like to do. Please call us  back once you have made a decision or when your daughter can come to the office to discuss in person. Feel free to send mychart message with possible dates you would be able to come.

## 2023-02-09 ENCOUNTER — Telehealth: Payer: Self-pay | Admitting: *Deleted

## 2023-02-09 ENCOUNTER — Ambulatory Visit
Admission: RE | Admit: 2023-02-09 | Discharge: 2023-02-09 | Disposition: A | Discharge status: Home / Self Care | Payer: Medicare HMO | Source: Ambulatory Visit | Attending: Radiation Oncology | Admitting: Radiation Oncology

## 2023-02-09 NOTE — Telephone Encounter (Signed)
 Called patient to ask about rescheduling today's appt, lvm for a return call

## 2023-02-26 ENCOUNTER — Other Ambulatory Visit: Payer: Self-pay | Admitting: Family Medicine

## 2023-02-26 ENCOUNTER — Other Ambulatory Visit: Payer: Self-pay

## 2023-02-26 DIAGNOSIS — I10 Essential (primary) hypertension: Secondary | ICD-10-CM

## 2023-02-26 MED ORDER — LISINOPRIL 5 MG PO TABS: 5.0000 mg | ORAL_TABLET | Freq: Every day | ORAL | 1 refills | Status: AC

## 2023-03-26 ENCOUNTER — Telehealth: Payer: Self-pay | Admitting: Medical Oncology

## 2023-03-26 NOTE — Telephone Encounter (Signed)
 Worsening left back pain. Returned dtrs call. Pt has "extensive constant back pain left upper back". The pain does not go away.  Last week was the first time she told her dtr today about her pain.  "She is taking  Tylenol like it is candy ".  It is not helping the pain.   Pt asked her dtr to call Dr. Arbutus Ped for something to ease her pain.

## 2023-03-27 ENCOUNTER — Other Ambulatory Visit: Payer: Self-pay | Admitting: Physician Assistant

## 2023-03-27 ENCOUNTER — Telehealth: Payer: Self-pay | Admitting: Medical Oncology

## 2023-03-27 ENCOUNTER — Other Ambulatory Visit: Payer: Self-pay | Admitting: Internal Medicine

## 2023-03-27 ENCOUNTER — Other Ambulatory Visit: Payer: Self-pay | Admitting: Medical Oncology

## 2023-03-27 DIAGNOSIS — C3490 Malignant neoplasm of unspecified part of unspecified bronchus or lung: Secondary | ICD-10-CM

## 2023-03-27 MED ORDER — OXYCODONE-ACETAMINOPHEN 5-325 MG PO TABS: 1.0000 | ORAL_TABLET | Freq: Three times a day (TID) | ORAL | 0 refills | Status: AC | PRN

## 2023-03-27 NOTE — Telephone Encounter (Addendum)
 Spoke to [place] and told her that per Dr. Arbutus Ped, Barbara Green's  prognosis is 3-6 months . She asked me to please send a hospice referral to [facility]. Emailed records to [facility].  Dtr or pt will call [facility] for mail delivery or pickup of pain med.

## 2023-03-27 NOTE — Telephone Encounter (Signed)
 Oxycodone prescription received. Dtr notified.

## 2023-05-04 ENCOUNTER — Other Ambulatory Visit (HOSPITAL_COMMUNITY): Payer: Self-pay

## 2023-05-04 ENCOUNTER — Encounter: Payer: Self-pay | Admitting: Internal Medicine

## 2023-05-05 ENCOUNTER — Other Ambulatory Visit (HOSPITAL_COMMUNITY): Payer: Self-pay

## 2023-07-28 DEATH — deceased

## 2023-09-03 ENCOUNTER — Encounter: Payer: Self-pay | Admitting: Internal Medicine

## 2023-09-10 ENCOUNTER — Ambulatory Visit
